# Patient Record
Sex: Female | Born: 1945
Health system: Southern US, Community
[De-identification: ages and names within clinical notes are randomized; demographics above are authoritative.]

## PROBLEM LIST (undated history)

## (undated) DIAGNOSIS — K589 Irritable bowel syndrome without diarrhea: Secondary | ICD-10-CM

## (undated) DIAGNOSIS — G20 Parkinson's disease: Secondary | ICD-10-CM

## (undated) DIAGNOSIS — R5383 Other fatigue: Secondary | ICD-10-CM

## (undated) DIAGNOSIS — G20A1 Parkinson's disease without dyskinesia, without mention of fluctuations: Secondary | ICD-10-CM

## (undated) DIAGNOSIS — R29818 Other symptoms and signs involving the nervous system: Secondary | ICD-10-CM

## (undated) DIAGNOSIS — E538 Deficiency of other specified B group vitamins: Secondary | ICD-10-CM

## (undated) DIAGNOSIS — L219 Seborrheic dermatitis, unspecified: Secondary | ICD-10-CM

## (undated) DIAGNOSIS — I509 Heart failure, unspecified: Secondary | ICD-10-CM

## (undated) DIAGNOSIS — N390 Urinary tract infection, site not specified: Secondary | ICD-10-CM

## (undated) DIAGNOSIS — H8113 Benign paroxysmal vertigo, bilateral: Secondary | ICD-10-CM

## (undated) DIAGNOSIS — K224 Dyskinesia of esophagus: Secondary | ICD-10-CM

## (undated) DIAGNOSIS — R002 Palpitations: Secondary | ICD-10-CM

## (undated) DIAGNOSIS — K219 Gastro-esophageal reflux disease without esophagitis: Secondary | ICD-10-CM

## (undated) DIAGNOSIS — M48061 Spinal stenosis, lumbar region without neurogenic claudication: Secondary | ICD-10-CM

## (undated) DIAGNOSIS — M48062 Spinal stenosis, lumbar region with neurogenic claudication: Secondary | ICD-10-CM

## (undated) DIAGNOSIS — I428 Other cardiomyopathies: Secondary | ICD-10-CM

## (undated) DIAGNOSIS — I493 Ventricular premature depolarization: Secondary | ICD-10-CM

## (undated) DIAGNOSIS — K635 Polyp of colon: Secondary | ICD-10-CM

## (undated) DIAGNOSIS — G9519 Other vascular myelopathies: Secondary | ICD-10-CM

## (undated) HISTORY — DX: Seborrheic dermatitis, unspecified: L21.9

## (undated) HISTORY — DX: Polyp of colon: K63.5

## (undated) HISTORY — DX: Other vascular myelopathies: G95.19

## (undated) HISTORY — DX: Parkinson's disease: G20

## (undated) HISTORY — PX: ENDOSCOPIC VEIN LASER TREATMENT: SHX1508

## (undated) HISTORY — DX: Heart failure, unspecified: I50.9

## (undated) HISTORY — DX: Other cardiomyopathies: I42.8

## (undated) HISTORY — PX: KNEE ARTHROSCOPY W/ MENISCECTOMY: SHX1879

## (undated) HISTORY — DX: Ventricular premature depolarization: I49.3

## (undated) HISTORY — DX: Irritable bowel syndrome, unspecified: K58.9

## (undated) HISTORY — DX: Dyskinesia of esophagus: K22.4

## (undated) HISTORY — DX: Spinal stenosis, lumbar region without neurogenic claudication: M48.061

## (undated) HISTORY — DX: Deficiency of other specified B group vitamins: E53.8

## (undated) HISTORY — DX: Other fatigue: R53.83

## (undated) HISTORY — DX: Gastro-esophageal reflux disease without esophagitis: K21.9

## (undated) HISTORY — DX: Parkinson's disease without dyskinesia, without mention of fluctuations: G20.A1

## (undated) HISTORY — DX: Other symptoms and signs involving the nervous system: R29.818

## (undated) HISTORY — DX: Palpitations: R00.2

## (undated) HISTORY — DX: Urinary tract infection, site not specified: N39.0

## (undated) HISTORY — PX: SPINE SURGERY: SHX786

---
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
## 1996-12-05 HISTORY — PX: COLECTOMY: SHX59

## 1998-11-25 ENCOUNTER — Other Ambulatory Visit: Admission: RE | Admit: 1998-11-25 | Discharge: 1998-11-25 | Payer: Self-pay | Admitting: Obstetrics & Gynecology

## 1999-11-04 ENCOUNTER — Encounter: Payer: Self-pay | Admitting: Family Medicine

## 1999-11-04 ENCOUNTER — Encounter: Admission: RE | Admit: 1999-11-04 | Discharge: 1999-11-04 | Payer: Self-pay | Admitting: Family Medicine

## 1999-11-08 ENCOUNTER — Encounter: Admission: RE | Admit: 1999-11-08 | Discharge: 1999-11-08 | Payer: Self-pay | Admitting: Family Medicine

## 1999-11-08 ENCOUNTER — Encounter: Payer: Self-pay | Admitting: Family Medicine

## 1999-12-06 HISTORY — PX: RECTOCELE REPAIR: SHX761

## 1999-12-29 ENCOUNTER — Other Ambulatory Visit: Admission: RE | Admit: 1999-12-29 | Discharge: 1999-12-29 | Payer: Self-pay | Admitting: Obstetrics & Gynecology

## 2000-06-14 ENCOUNTER — Ambulatory Visit (HOSPITAL_COMMUNITY): Admission: RE | Admit: 2000-06-14 | Discharge: 2000-06-14 | Payer: Self-pay | Admitting: Gastroenterology

## 2000-11-13 ENCOUNTER — Encounter: Payer: Self-pay | Admitting: Emergency Medicine

## 2000-11-13 ENCOUNTER — Emergency Department (HOSPITAL_COMMUNITY): Admission: EM | Admit: 2000-11-13 | Discharge: 2000-11-13 | Payer: Self-pay | Admitting: Emergency Medicine

## 2000-11-20 ENCOUNTER — Encounter: Payer: Self-pay | Admitting: Family Medicine

## 2000-11-20 ENCOUNTER — Encounter: Admission: RE | Admit: 2000-11-20 | Discharge: 2000-11-20 | Payer: Self-pay | Admitting: Family Medicine

## 2001-05-14 ENCOUNTER — Other Ambulatory Visit: Admission: RE | Admit: 2001-05-14 | Discharge: 2001-05-14 | Payer: Self-pay | Admitting: Obstetrics & Gynecology

## 2001-06-21 ENCOUNTER — Encounter: Admission: RE | Admit: 2001-06-21 | Discharge: 2001-06-21 | Payer: Self-pay | Admitting: Obstetrics & Gynecology

## 2001-06-21 ENCOUNTER — Encounter: Payer: Self-pay | Admitting: Obstetrics & Gynecology

## 2001-12-06 ENCOUNTER — Encounter: Payer: Self-pay | Admitting: Family Medicine

## 2001-12-06 ENCOUNTER — Encounter: Admission: RE | Admit: 2001-12-06 | Discharge: 2001-12-06 | Payer: Self-pay | Admitting: Family Medicine

## 2002-04-03 ENCOUNTER — Encounter: Payer: Self-pay | Admitting: Family Medicine

## 2002-04-03 ENCOUNTER — Encounter: Admission: RE | Admit: 2002-04-03 | Discharge: 2002-04-03 | Payer: Self-pay | Admitting: Family Medicine

## 2002-06-24 ENCOUNTER — Other Ambulatory Visit: Admission: RE | Admit: 2002-06-24 | Discharge: 2002-06-24 | Payer: Self-pay | Admitting: Obstetrics & Gynecology

## 2002-07-19 ENCOUNTER — Inpatient Hospital Stay (HOSPITAL_COMMUNITY): Admission: RE | Admit: 2002-07-19 | Discharge: 2002-07-20 | Payer: Self-pay | Admitting: Obstetrics & Gynecology

## 2002-07-21 ENCOUNTER — Inpatient Hospital Stay (HOSPITAL_COMMUNITY): Admission: AD | Admit: 2002-07-21 | Discharge: 2002-07-21 | Payer: Self-pay | Admitting: Obstetrics & Gynecology

## 2002-10-22 ENCOUNTER — Encounter: Payer: Self-pay | Admitting: Family Medicine

## 2002-10-22 ENCOUNTER — Encounter: Admission: RE | Admit: 2002-10-22 | Discharge: 2002-10-22 | Payer: Self-pay | Admitting: Family Medicine

## 2003-01-23 ENCOUNTER — Encounter: Admission: RE | Admit: 2003-01-23 | Discharge: 2003-01-23 | Payer: Self-pay | Admitting: Obstetrics & Gynecology

## 2003-01-23 ENCOUNTER — Encounter: Payer: Self-pay | Admitting: Obstetrics & Gynecology

## 2003-01-28 ENCOUNTER — Encounter: Payer: Self-pay | Admitting: Obstetrics & Gynecology

## 2003-01-28 ENCOUNTER — Encounter: Admission: RE | Admit: 2003-01-28 | Discharge: 2003-01-28 | Payer: Self-pay | Admitting: Obstetrics & Gynecology

## 2003-07-14 ENCOUNTER — Encounter: Admission: RE | Admit: 2003-07-14 | Discharge: 2003-07-14 | Payer: Self-pay | Admitting: Obstetrics & Gynecology

## 2003-07-14 ENCOUNTER — Encounter: Payer: Self-pay | Admitting: Obstetrics & Gynecology

## 2003-10-27 ENCOUNTER — Other Ambulatory Visit: Admission: RE | Admit: 2003-10-27 | Discharge: 2003-10-27 | Payer: Self-pay | Admitting: Obstetrics & Gynecology

## 2004-02-16 ENCOUNTER — Encounter: Admission: RE | Admit: 2004-02-16 | Discharge: 2004-02-16 | Payer: Self-pay | Admitting: Obstetrics & Gynecology

## 2004-04-05 ENCOUNTER — Encounter: Admission: RE | Admit: 2004-04-05 | Discharge: 2004-04-05 | Payer: Self-pay | Admitting: Family Medicine

## 2004-08-01 ENCOUNTER — Encounter: Admission: RE | Admit: 2004-08-01 | Discharge: 2004-08-01 | Payer: Self-pay | Admitting: Family Medicine

## 2004-12-27 ENCOUNTER — Other Ambulatory Visit: Admission: RE | Admit: 2004-12-27 | Discharge: 2004-12-27 | Payer: Self-pay | Admitting: Obstetrics & Gynecology

## 2005-03-14 ENCOUNTER — Encounter: Admission: RE | Admit: 2005-03-14 | Discharge: 2005-03-14 | Payer: Self-pay | Admitting: Obstetrics & Gynecology

## 2006-06-12 ENCOUNTER — Encounter: Admission: RE | Admit: 2006-06-12 | Discharge: 2006-06-12 | Payer: Self-pay | Admitting: Obstetrics & Gynecology

## 2007-07-09 ENCOUNTER — Encounter: Admission: RE | Admit: 2007-07-09 | Discharge: 2007-07-09 | Payer: Self-pay | Admitting: Obstetrics & Gynecology

## 2008-09-15 ENCOUNTER — Encounter: Admission: RE | Admit: 2008-09-15 | Discharge: 2008-09-15 | Payer: Self-pay | Admitting: Obstetrics & Gynecology

## 2009-09-21 ENCOUNTER — Encounter: Admission: RE | Admit: 2009-09-21 | Discharge: 2009-09-21 | Payer: Self-pay | Admitting: Obstetrics & Gynecology

## 2009-10-22 ENCOUNTER — Encounter: Admission: RE | Admit: 2009-10-22 | Discharge: 2009-10-22 | Payer: Self-pay | Admitting: Family Medicine

## 2010-02-27 ENCOUNTER — Emergency Department (HOSPITAL_COMMUNITY): Admission: EM | Admit: 2010-02-27 | Discharge: 2010-02-27 | Payer: Self-pay | Admitting: Family Medicine

## 2010-03-02 ENCOUNTER — Encounter: Admission: RE | Admit: 2010-03-02 | Discharge: 2010-03-02 | Payer: Self-pay | Admitting: Family Medicine

## 2010-03-05 ENCOUNTER — Encounter: Admission: RE | Admit: 2010-03-05 | Discharge: 2010-03-05 | Payer: Self-pay | Admitting: Family Medicine

## 2010-04-08 ENCOUNTER — Encounter: Admission: RE | Admit: 2010-04-08 | Discharge: 2010-04-08 | Payer: Self-pay | Admitting: Family Medicine

## 2010-08-19 ENCOUNTER — Encounter: Admission: RE | Admit: 2010-08-19 | Discharge: 2010-08-19 | Payer: Self-pay | Admitting: Family Medicine

## 2010-09-23 ENCOUNTER — Encounter: Admission: RE | Admit: 2010-09-23 | Discharge: 2010-09-23 | Payer: Self-pay | Admitting: Family Medicine

## 2010-11-10 ENCOUNTER — Encounter
Admission: RE | Admit: 2010-11-10 | Discharge: 2010-11-10 | Payer: Self-pay | Source: Home / Self Care | Admitting: Family Medicine

## 2010-12-01 ENCOUNTER — Inpatient Hospital Stay (HOSPITAL_COMMUNITY)
Admission: RE | Admit: 2010-12-01 | Discharge: 2010-12-02 | Payer: Self-pay | Source: Home / Self Care | Attending: Neurosurgery | Admitting: Neurosurgery

## 2010-12-22 ENCOUNTER — Encounter
Admission: RE | Admit: 2010-12-22 | Discharge: 2010-12-22 | Payer: Self-pay | Source: Home / Self Care | Attending: Neurosurgery | Admitting: Neurosurgery

## 2011-01-14 ENCOUNTER — Other Ambulatory Visit (HOSPITAL_COMMUNITY): Payer: Self-pay | Admitting: Neurosurgery

## 2011-01-14 DIAGNOSIS — M5126 Other intervertebral disc displacement, lumbar region: Secondary | ICD-10-CM

## 2011-01-17 ENCOUNTER — Ambulatory Visit (HOSPITAL_COMMUNITY)
Admission: RE | Admit: 2011-01-17 | Discharge: 2011-01-17 | Disposition: A | Discharge status: Home / Self Care | Payer: Medicare Other | Source: Ambulatory Visit | Attending: Neurosurgery | Admitting: Neurosurgery

## 2011-01-17 DIAGNOSIS — M5126 Other intervertebral disc displacement, lumbar region: Secondary | ICD-10-CM

## 2011-01-17 DIAGNOSIS — M79609 Pain in unspecified limb: Secondary | ICD-10-CM | POA: Insufficient documentation

## 2011-01-17 DIAGNOSIS — M545 Low back pain, unspecified: Secondary | ICD-10-CM | POA: Insufficient documentation

## 2011-01-17 DIAGNOSIS — M519 Unspecified thoracic, thoracolumbar and lumbosacral intervertebral disc disorder: Secondary | ICD-10-CM | POA: Insufficient documentation

## 2011-01-17 MED ORDER — IOHEXOL 180 MG/ML  SOLN: 20.0000 mL | Freq: Once | INTRAMUSCULAR | Status: DC | PRN

## 2011-01-20 ENCOUNTER — Encounter (HOSPITAL_COMMUNITY)
Admission: RE | Admit: 2011-01-20 | Discharge: 2011-01-20 | Disposition: A | Discharge status: Home / Self Care | Payer: Medicare Other | Source: Ambulatory Visit | Attending: Neurosurgery | Admitting: Neurosurgery

## 2011-01-20 DIAGNOSIS — Z01812 Encounter for preprocedural laboratory examination: Secondary | ICD-10-CM | POA: Insufficient documentation

## 2011-01-20 LAB — BASIC METABOLIC PANEL
BUN: 24 mg/dL — ABNORMAL HIGH (ref 6–23)
CO2: 30 mEq/L (ref 19–32)
Calcium: 9.6 mg/dL (ref 8.4–10.5)
Chloride: 105 mEq/L (ref 96–112)
Creatinine, Ser: 0.96 mg/dL (ref 0.4–1.2)
GFR calc Af Amer: >60 mL/min (ref >60)
GFR calc non Af Amer: 58 mL/min — ABNORMAL LOW (ref >60)
Glucose, Bld: 100 mg/dL — ABNORMAL HIGH (ref 70–99)
Potassium: 5.3 mEq/L — ABNORMAL HIGH (ref 3.5–5.1)
Sodium: 143 mEq/L (ref 135–145)

## 2011-01-20 LAB — CBC
HCT: 37.7 % (ref 36.0–46.0)
Hemoglobin: 12.1 g/dL (ref 12.0–15.0)
MCH: 28.8 pg (ref 26.0–34.0)
MCHC: 32.1 g/dL (ref 30.0–36.0)
MCV: 89.8 fL (ref 78.0–100.0)
Platelets: 297 10*3/uL (ref 150–400)
RBC: 4.2 MIL/uL (ref 3.87–5.11)
RDW: 12.6 % (ref 11.5–15.5)
WBC: 6.6 10*3/uL (ref 4.0–10.5)

## 2011-01-20 LAB — SURGICAL PCR SCREEN
MRSA, PCR: NEGATIVE
Staphylococcus aureus: NEGATIVE

## 2011-01-24 ENCOUNTER — Inpatient Hospital Stay (HOSPITAL_COMMUNITY)
Admission: RE | Admit: 2011-01-24 | Discharge: 2011-01-25 | DRG: 491 | Disposition: A | Discharge status: Home / Self Care | Payer: Medicare Other | Source: Ambulatory Visit | Attending: Neurosurgery | Admitting: Neurosurgery

## 2011-01-24 ENCOUNTER — Inpatient Hospital Stay (HOSPITAL_COMMUNITY): Payer: Medicare Other

## 2011-01-24 DIAGNOSIS — M5126 Other intervertebral disc displacement, lumbar region: Principal | ICD-10-CM | POA: Diagnosis present

## 2011-01-24 DIAGNOSIS — Z8614 Personal history of Methicillin resistant Staphylococcus aureus infection: Secondary | ICD-10-CM

## 2011-01-24 DIAGNOSIS — J449 Chronic obstructive pulmonary disease, unspecified: Secondary | ICD-10-CM | POA: Diagnosis present

## 2011-01-24 DIAGNOSIS — K219 Gastro-esophageal reflux disease without esophagitis: Secondary | ICD-10-CM | POA: Diagnosis present

## 2011-01-24 DIAGNOSIS — J4489 Other specified chronic obstructive pulmonary disease: Secondary | ICD-10-CM | POA: Diagnosis present

## 2011-01-26 NOTE — Op Note (Signed)
Carrie [name], Carrie [name]                ACCOUNT NO.:  [id number]  MEDICAL RECORD NO.:  [id number]           PATIENT TYPE:  I  LOCATION:  3526                         FACILITY:  [facility]  PHYSICIAN:  Hewitt Shorts, M.D.DATE OF BIRTH:  03/19/1946  DATE OF PROCEDURE:  01/24/2011 DATE OF DISCHARGE:  01/20/2011                              OPERATIVE REPORT   PREOPERATIVE DIAGNOSES:  Recurrent left L4-L5 lumbar disk herniation, lumbar degenerative disk disease, lumbar spondylosis, lumbar radiculopathy.  POSTOPERATIVE DIAGNOSES:  Recurrent left L4-L5 lumbar disk herniation, lumbar degenerative disk disease, lumbar spondylosis, lumbar radiculopathy.  PROCEDURE:  Left L4-L5 lumbar laminotomy and microdiskectomy.  SURGEON:  Hewitt Shorts, MD  ASSISTANT:  Hilda Lias, MD  ANESTHESIA:  General endotracheal.  INDICATIONS:  The patient is a 65 year old woman who is about 2 months status post a left L4-L5 lumbar laminotomy and microdiskectomy.  She had excellent relief for radicular pain for about 10 days and then the pain recurred and did not respond to any treatment measures.  She was evaluated with MRI scan and subsequently with possibly myelogram CT scan which revealed recurrent disk herniation and decision was made to proceed with elective laminotomy and microdiskectomy.  DESCRIPTION OF PROCEDURE:  The patient was brought to the operating room, placed under general endotracheal anesthesia.  The patient was turned to prone position.  Lumbar region was prepped with Betadine soap solution and draped in the sterile fashion.  The midline incision was infiltrated with local anesthetic with epinephrine.  A midline incision was made, carried down through the subcutaneous tissue.  Bipolar electrocautery was used to maintain hemostasis.  Dissection was carried out through the lumbar fascia which was incised on the left side of the midline.  The paraspinal muscles were dissected from the  spinous process and lamina in a subperiosteal fashion.  Self-retaining retractor was placed and x-ray was taken and the L4-L5 interlaminar space was identified.  The microscope was draped and brought to the field to provide additional navigation, illumination, and visualization. Remainder of the decompression was performed using microdissection and microsurgical technique.  Her previous laminotomy was defined using a microcurette to remove the scar tissue.  We were able to define the borders of the laminotomy and encountered what appeared to be a recurrent disk herniation.  There was also possibly some synovial cyst emanating from the facet joint.  This was removed as well.  We identified the thecal sac and exiting left L5 nerve root.  There was some disk material that extruded rostral to the disk space, this was also removed and we entered into the disk space and continued with a thorough diskectomy using a variety of microcurette and pituitary rongeurs.  In the end, all loose fragments of disk material were removed from the epidural space and disk space and good decompression of the thecal sac and nerve root was achieved.  Once decompression was completed, hemostasis was established with the use of bipolar electrocautery and then we instilled 2 mL of fentanyl, 80 mg of Depo- Medrol into the epidural space to proceed with closure.  Deep fascia closed with interrupted undyed 1 Vicryl  sutures, subcutaneous and subcuticular were closed with interrupted inverted 2-0 undyed Vicryl sutures.  Skin was approximated with Dermabond.  Procedure was tolerated well.  Estimated blood loss was less than 25 mL.  Sponge count correct.  Following surgery, the patient was turned back to supine position to be reversed from the anesthetic, extubated, and transferred to the recovery room for further care.     Hewitt Shorts, M.D.     RWN/MEDQ  D:  01/24/2011  T:  01/25/2011  Job:   [id number]  Electronically Signed by Shirlean Kelly M.D. on 01/26/2011 11:36:14 AM

## 2011-02-14 LAB — BASIC METABOLIC PANEL
BUN: 17 mg/dL (ref 6–23)
CO2: 31 mEq/L (ref 19–32)
Calcium: 9.5 mg/dL (ref 8.4–10.5)
Chloride: 108 mEq/L (ref 96–112)
Creatinine, Ser: 0.84 mg/dL (ref 0.4–1.2)
GFR calc Af Amer: >60 mL/min (ref >60)
GFR calc non Af Amer: >60 mL/min (ref >60)
Glucose, Bld: 101 mg/dL — ABNORMAL HIGH (ref 70–99)
Potassium: 5 mEq/L (ref 3.5–5.1)
Sodium: 143 mEq/L (ref 135–145)

## 2011-02-14 LAB — CBC
HCT: 39.2 % (ref 36.0–46.0)
Hemoglobin: 12.3 g/dL (ref 12.0–15.0)
MCH: 28.7 pg (ref 26.0–34.0)
MCHC: 31.4 g/dL (ref 30.0–36.0)
MCV: 91.4 fL (ref 78.0–100.0)
Platelets: 238 10*3/uL (ref 150–400)
RBC: 4.29 MIL/uL (ref 3.87–5.11)
RDW: 13.2 % (ref 11.5–15.5)
WBC: 7.1 10*3/uL (ref 4.0–10.5)

## 2011-02-14 LAB — SURGICAL PCR SCREEN
MRSA, PCR: NEGATIVE
Staphylococcus aureus: POSITIVE — AB

## 2011-04-05 ENCOUNTER — Other Ambulatory Visit: Payer: Self-pay | Admitting: Neurosurgery

## 2011-04-05 DIAGNOSIS — M545 Low back pain, unspecified: Secondary | ICD-10-CM

## 2011-04-06 ENCOUNTER — Ambulatory Visit
Admission: RE | Admit: 2011-04-06 | Discharge: 2011-04-06 | Disposition: A | Discharge status: Home / Self Care | Payer: BLUE CROSS/BLUE SHIELD | Source: Ambulatory Visit | Attending: Neurosurgery | Admitting: Neurosurgery

## 2011-04-06 DIAGNOSIS — M545 Low back pain, unspecified: Secondary | ICD-10-CM

## 2011-04-06 MED ORDER — GADOBENATE DIMEGLUMINE 529 MG/ML IV SOLN
15.0000 mL | Freq: Once | INTRAVENOUS | Status: AC | PRN
  Administered 2011-04-06: 15 mL via INTRAVENOUS

## 2011-04-19 ENCOUNTER — Encounter (HOSPITAL_COMMUNITY)
Admission: RE | Admit: 2011-04-19 | Discharge: 2011-04-19 | Disposition: A | Discharge status: Home / Self Care | Payer: Medicare Other | Source: Ambulatory Visit | Attending: Neurosurgery | Admitting: Neurosurgery

## 2011-04-19 LAB — SURGICAL PCR SCREEN
MRSA, PCR: NEGATIVE
Staphylococcus aureus: NEGATIVE

## 2011-04-19 LAB — ABO/RH: ABO/RH(D): A POS

## 2011-04-19 LAB — CBC
HCT: 38.5 % (ref 36.0–46.0)
Hemoglobin: 12.7 g/dL (ref 12.0–15.0)
MCH: 29.7 pg (ref 26.0–34.0)
MCHC: 33 g/dL (ref 30.0–36.0)
MCV: 90.2 fL (ref 78.0–100.0)
Platelets: 255 10*3/uL (ref 150–400)
RBC: 4.27 MIL/uL (ref 3.87–5.11)
RDW: 12.6 % (ref 11.5–15.5)
WBC: 7.1 10*3/uL (ref 4.0–10.5)

## 2011-04-19 LAB — BASIC METABOLIC PANEL
BUN: 29 mg/dL — ABNORMAL HIGH (ref 6–23)
CO2: 30 mEq/L (ref 19–32)
Calcium: 9.8 mg/dL (ref 8.4–10.5)
Chloride: 103 mEq/L (ref 96–112)
Creatinine, Ser: 1.16 mg/dL (ref 0.4–1.2)
GFR calc Af Amer: 57 mL/min — ABNORMAL LOW (ref >60)
GFR calc non Af Amer: 47 mL/min — ABNORMAL LOW (ref >60)
Glucose, Bld: 100 mg/dL — ABNORMAL HIGH (ref 70–99)
Potassium: 5.2 mEq/L — ABNORMAL HIGH (ref 3.5–5.1)
Sodium: 140 mEq/L (ref 135–145)

## 2011-04-19 LAB — TYPE AND SCREEN
ABO/RH(D): A POS
Antibody Screen: NEGATIVE

## 2011-04-21 ENCOUNTER — Inpatient Hospital Stay (HOSPITAL_COMMUNITY)
Admission: RE | Admit: 2011-04-21 | Discharge: 2011-04-27 | DRG: 460 | Disposition: A | Discharge status: Home Health | Payer: Medicare Other | Source: Ambulatory Visit | Attending: Neurosurgery | Admitting: Neurosurgery

## 2011-04-21 ENCOUNTER — Inpatient Hospital Stay (HOSPITAL_COMMUNITY): Payer: Medicare Other

## 2011-04-21 DIAGNOSIS — M5126 Other intervertebral disc displacement, lumbar region: Secondary | ICD-10-CM | POA: Diagnosis present

## 2011-04-21 DIAGNOSIS — K219 Gastro-esophageal reflux disease without esophagitis: Secondary | ICD-10-CM | POA: Diagnosis present

## 2011-04-21 DIAGNOSIS — M51379 Other intervertebral disc degeneration, lumbosacral region without mention of lumbar back pain or lower extremity pain: Principal | ICD-10-CM | POA: Diagnosis present

## 2011-04-21 DIAGNOSIS — J449 Chronic obstructive pulmonary disease, unspecified: Secondary | ICD-10-CM | POA: Diagnosis present

## 2011-04-21 DIAGNOSIS — M5137 Other intervertebral disc degeneration, lumbosacral region: Principal | ICD-10-CM | POA: Diagnosis present

## 2011-04-21 DIAGNOSIS — J4489 Other specified chronic obstructive pulmonary disease: Secondary | ICD-10-CM | POA: Diagnosis present

## 2011-04-22 NOTE — Op Note (Signed)
Carrie [name], Carrie [name]                ACCOUNT NO.:  [id number]  MEDICAL RECORD NO.:  [id number]           PATIENT TYPE:  I  LOCATION:  3534                         FACILITY:  [facility]  PHYSICIAN:  Hewitt Shorts, M.D.DATE OF BIRTH:  26-Dec-1945  DATE OF PROCEDURE:  04/21/2011 DATE OF DISCHARGE:                              OPERATIVE REPORT   PREOPERATIVE DIAGNOSES:  Recurrent left L4-5 lumbar disk herniation, lumbar degenerative disk disease, lumbar spondylosis, and lumbar radiculopathy.  POSTOPERATIVE DIAGNOSES:  Recurrent left L4-5 lumbar disk herniation, lumbar degenerative disk disease, lumbar spondylosis, and lumbar radiculopathy.  PROCEDURES:  Bilateral L4-5 lumbar laminotomy, facetectomy, foraminotomy, and microdiskectomy with microdissection and microsurgical technique.  Bilateral L4-5 posterior lumbar interbody arthrodesis with AVS PEEK interbody implants, Vitoss bone marrow aspirate, infuse bilateral L4-5 posterior arthrodesis with radius posterior instrumentation Vitoss bone marrow aspirate and infuse  SURGEON:  Hewitt Shorts, MD  ASSISTANT:  Danae Orleans. Venetia Maxon, MD  ANESTHESIA:  General endotracheal.  INDICATIONS:  The patient is a 65 year old woman who suffered a second recurrent disk herniation on the left side at L4-5 with recurrent lumbar radiculopathy.  She had undergone surgery at the end of December and again in February, but unfortunately developed recurrent radiculopathy. MRI scan revealed large recurrent disk herniation.  Decision was made to proceed with decompression and stabilization.  PROCEDURE:  The patient was brought to the operating room and placed under general endotracheal anesthesia.  Lumbar region was prepped with Betadine soap and solution, and draped in a sterile fashion.  The midline was infiltrated with local anesthetic with epinephrine and the previous midline incision was reopened and extended rostrally and caudally.  Dissection was  carried down through the subcutaneous tissue. Bipolar cautery and electrocautery were used to maintain hemostasis. Dissection was carried down to the lumbar fascia, which was incised bilaterally and the paraspinal muscles were dissected from the spinous process and lamina in a subperiosteal fashion.  The previous left L4-5 laminotomy was noted and we were able to dissect around the margins of that laminotomy.  An x-ray was taken to confirm our localization and then we dissected laterally over the mildly hypertrophic facet complex at L4-5 exposing the transverse process at L4 and L5 bilaterally.  Then with magnification and microdissection and microsurgical technique, we proceeded with decompression, bilateral laminotomy was performed. Medial facetectomy and foraminotomy we removed the ligamentum flavum to expose the thecal sac and nerve roots.  Left-sided diskectomy was begun on the right side.  The annulus was exposed.  The overlying epidural veins coagulated and divided and then we incised the annulus and entered into the disk space, proceeded with thorough diskectomy using a variety of pituitary rongeurs and micro curettes.  Spinal overgrowth in the posterior aspect of the vertebral body was removed and then engaged within the left side.  On entry into the disk space, we proceeded with diskectomy from the left side again using micro curettes and pituitary rongeurs; however, there was plenty of fragmented disk embedded within the ventral epidural tissues.  These were gently mobilized looking for a hook.  We removed in a piecemeal fashion with decompression of  thecal sac and nerve roots.  Once the diskectomy was completed, we prepared the interbody space for interbody arthrodesis using paddle curettes.  The cartilaginous endplate was removed and then we measured the height of the intervertebral disk space and selected two 8-mm in height PEEK implants.  We then draped the C-arm  fluoroscope, was brought into the field and localized the pedicle entry sites bilaterally at L4 and 5.  The pedicles was probed with bone marrow aspirate from the vertebral body and was injected over 10-mL strip of Vitoss.  We then examined each of the pedicles with a ball probe, good bony surface were found.  Each was tapped with 5.25 mm tap.  Again good threading was found.  On insertion we found no cutouts and then we placed 5.75 x 44 mm screws at L4, 5.75 x 37 mm screws at L5.  We then packed two 8-mm height implants with combination of Vitoss with bone marrow aspirate with infuse foam was placed in the right side medially to identify the seventh, tenth  necrotic space.  The left side was packed with additional infuse and Vitoss bone marrow aspirate.  Then retracted the thecal sac and nerve root medially and gently tamped the left-sided implant in position.  Additional Vitoss with bone marrow aspirate lateral to each vertebral body.  We then exposed the lateral gutter transverse process L4 and L5 disk space infused with Vitoss bone marrow aspirate.  We then selected two 35- mm rods were placed in the screw head and locking caps were placed andthen final tightening was done.  The wound was irrigated numerous times throughout the procedure with sterile solution, subsequently with bacitracin solution.  Good hemostasis was established and confirmed. Decompression was completed.  Hemostasis established.  We proceed with closure.  Deep fascia closed with interrupted undyed #1 Vicryl suture. Subcutaneous and subcuticular closed with interrupted inverted 2-0 undyed Vicryl sutures.  Skin was approximated with Dermabond.  The procedure was tolerated well.  The estimated blood loss 150 mL.  Sponge and needle count correct.  We did use a Cell Saver during the procedure, but the Cell Saver technician felt that there was insufficient blood loss to process the collected specimen.  The wound was  dressed with sterile gauze and 4-inch Hypafix.  Following surgery, the patient returned back to a supine position, reversed from the anesthetic, extubated and transferred to the recovery room for further care.     Hewitt Shorts, M.D.     RWN/MEDQ  D:  04/21/2011  T:  04/22/2011  Job:  [id number]  Electronically Signed by Shirlean Kelly M.D. on 04/22/2011 11:09:43 AM

## 2011-04-22 NOTE — Procedures (Signed)
[facility]  Patient:    Carrie [name], Carrie [name]                       MRN: [id number] Proc. Date: 06/14/00 Adm. Date:  [id number] Disc. Date: [id number] Attending:  Deneen Harts CC:         Quita Skye. Artis Flock, M.D.                           Procedure Report  PROCEDURE:  Colonoscopy.  ENDOSCOPIST:  Griffith Citron, M.D. [facility]  INDICATIONS:  A 65 year old white female undergoing colonoscopy for neoplasia surveillance.  Significant hepatic flexure adenoma resected surgically with right hemicolectomy and ileocolostomy performed three years ago by Dr. Cyndia Bent.  A large adenoma was found at colonoscopy and was felt to represent an early malignancy.  Pathology revealed benign adenoma.  The patient in the interim with no difficulty.  Undergoing repeat colonoscopy for neoplasia surveillance.  DESCRIPTION OF PROCEDURE:  After reviewing the nature of the procedure with the patient including potential risks and complications, and after discussing alternative methods of diagnosis including flexible sigmoidoscopy/barium enema, informed consent was signed.  The patient was premedicated receiving IV sedation administered in divided doses totaling Versed 10 mg, fentanyl 100 mcg.  Using an Olympus pediatric PCF-140L video colonoscope, the rectum was intubated after normal digital examination.  The scope was inserted in the rectum and advanced under direct vision around the entire length of the colon to the ileocolostomy which was apparent in a side-by-side manner in the region of the hepatic flexure.  Preparation was excellent throughout.  The scope was slowly withdrawn with careful inspection of the entire colon in a retrograde manner including retroflex view in the rectal vault.  No abnormality was noted.  Specifically there was no evidence of colorectal neoplasia, diverticular disease, mucosal inflammation, or vascular lesion. Retroflex view did reveal  internal hemorrhoids which were not inflamed.  The colon was decompressed and the scope withdrawn.  The patient tolerated the procedure without difficulty being maintained on Datascope monitor and low-flow oxygen throughout.  Returned to recovery in stable condition.  TIME: 1, TECHNICAL: 2, PREPARATION: 1,  TOTALS = 4.  ASSESSMENT: 1. Status post right hemicolectomy with a side-by-side ileocolostomy at the    hepatic flexure. 2. No recurrent colorectal neoplasia. 3. Internal hemorrhoids - noninflamed.  RECOMMENDATIONS: 1. Rectal care p.r.n. 2. Repeat colonoscopy in five years time. DD:  06/14/00 TD:  06/14/00 Job: [id number]

## 2011-04-22 NOTE — Discharge Summary (Signed)
   NAME:  Carrie [name], Carrie [name]                          ACCOUNT NO.:  [id number]   MEDICAL RECORD NO.:  [id number]                   PATIENT TYPE:  MAT   LOCATION:  MATC                                 FACILITY:  [facility]   PHYSICIAN:  Ilda Mori, M.D.                DATE OF BIRTH:  July 21, 1946   DATE OF ADMISSION:  07/19/2002  DATE OF DISCHARGE:  07/20/2002                                 DISCHARGE SUMMARY   FINAL DIAGNOSIS:  Symptomatic rectocele.   SECONDARY DIAGNOSES:  None.   PROCEDURE:  Posterior repair.   CONDITION ON DISCHARGE:  Improved.   HOSPITAL COURSE:  This is a 65 year old gravida 2 para 2 who has  approximately a one-year history of difficulty passing stools.  The patient  found that she had to press on the posterior vaginal wall to direct the  stool through the sphincter.  This problem has been increasing throughout  the year and the patient requested surgical correction.  The patient was  brought to the operating room on the day of admission where a posterior  repair was performed without complication.  The patient was observed 24  hours postoperatively at which time she was found to be voiding normally and  her pain was controlled with oral analgesia.  She was therefore discharged  on postoperative day #1 and told to eat a regular diet, to maintain  decreased activity and complete pelvic rest.  She was given stool softeners  to take on a daily basis and Tylox #20 tablets to take one to two q.4h. for  pain.  She was asked to call the office and make a follow-up visit in one  week.   LABORATORY DATA:  Preoperative hemoglobin 13.3, white count 7600.  Routine  chemistries were all within normal limits except for a slightly elevated  sodium of 150.  A urinalysis was benign except for moderate leukocyte  esterase.   An EKG showed some mild abnormalities.                                               Ilda Mori, M.D.    RK/MEDQ  D:  08/28/2002  T:  08/28/2002   Job:  [id number]

## 2011-04-22 NOTE — H&P (Signed)
   NAME:  Carrie [name], Carrie [name]                          ACCOUNT NO.:  [id number]   MEDICAL RECORD NO.:  [id number]                   PATIENT TYPE:  INP   LOCATION:  NA                                   [facility]   PHYSICIAN:  Ilda Mori, MD                  DATE OF BIRTH:  1946/01/22   DATE OF ADMISSION:  07/19/2002  DATE OF DISCHARGE:                                HISTORY & PHYSICAL   CHIEF COMPLAINT:  Rectocele.   HISTORY OF PRESENT ILLNESS:  This is a 65 year old, gravida 2, para 2, who  was seen at her last annual physical exam in July, complaining of difficulty  passing stool.  The patient has had a known rectocele for several years  which had not been symptomatic.  However, at this time, the patient says  that when straining at stool, in order to achieve evacuation, she has to put  pressure on the posterior aspect of the vagina.   PAST MEDICAL HISTORY:  Her previous surgeries include a right colectomy for  a benign polyp.  Medical problems are that she was diagnosed with a  tachyarrhythmia which is well-controlled and has had TMJ syndrome.   MEDICATIONS:  Norpace, Nexium, and Zantac.   ALLERGIES:  ERYTHROMYCIN, CODEINE.   FAMILY HISTORY:  She had a maternal uncle with colon cancer, maternal aunt  with breast and colon cancer.  No history of heart disease in the family.   SOCIAL HISTORY:  She is a nonsmoker.   REVIEW OF SYSTEMS:  Negative in detail.  Of note, is that she has no  symptoms of urinary stress incontinence.   PHYSICAL EXAMINATION:  EARS/NOSE/THROAT:  Normal.  BREASTS:  Without masses.  HEART/LUNGS:  Heart without murmur or gallop, and her lungs are clear.  ABDOMEN:  Soft without hepatosplenomegaly.  She has no lymphadenopathy.  EXTERNAL GENITALIA:  Normal.  VAGINA:  3+ rectocele and a 1-2+ cystocele.  UTERUS:  Small with no cervicouterine prolapse noted.   ADMITTING DIAGNOSIS:  Symptomatic rectocele.   HOSPITAL PLAN:  Posterior repair.                                       Ilda Mori, MD    RK/MEDQ  D:  07/17/2002  T:  07/17/2002  Job:  [id number]

## 2011-04-22 NOTE — Op Note (Signed)
   NAME:  Carrie [name], Carrie [name]                          ACCOUNT NO.:  [id number]   MEDICAL RECORD NO.:  [id number]                   PATIENT TYPE:  INP   LOCATION:  9309                                 [facility]ACILITY[facility]  [facility]   PHYSICIAN:  Ilda Mori, MD                  DATE OF BIRTH:  11/25/1946   DATE OF PROCEDURE:  DATE OF DISCHARGE:                                 OPERATIVE REPORT   PREOPERATIVE DIAGNOSIS:  Symptomatic rectocele.   POSTOPERATIVE DIAGNOSIS:  Symptomatic rectocele.   PROCEDURE:  Posterior repair.   SURGEON:  Ilda Mori, MD   ASSISTANT:  Randye Lobo, M.D.   ANESTHESIA:  General endotracheal.   ESTIMATED BLOOD LOSS:  100 cc.   FINDINGS:  3+ rectocele.   INDICATIONS FOR PROCEDURE:  This is a 65 year old  gravida 2, para 2 who  noted approximately one year prior to admission difficulty passing her  stools. She found that she had to press on the posterior vaginal wall to  direct the stool through the sphincter. This problem increased throughout  the year and she requested surgical correction.   PROCEDURE:  The patient  was taken to the operating room and general  endotracheal anesthesia was induced. She was then placed in the dorsal  lithotomy position. The perineum and vagina were prepped and draped in a  sterile fashion. The perineum was incised sharply in a V-shape and the  vaginal mucosa was dissected from the rectum. The surgeon then placed his  left hand in the rectum and the areas of the rectocele were delineated and  closed with bring the levator muscles across the midline with interrupted 0  Vicryl suture. After the rectocele had been reduced and repaired the  excessive vaginal mucosa was excised and the vaginal tissue was closed with  a running 0 Vicryl suture with the sutures being placed close together to  take care not to shorten the vaginal vault. The perineum was then also  reinforced by bring the bulbocavernosus vessels across the midline for  extra  support and then the vulvar skin was closed with a subcuticular suture. The  vagina was then packed with 1-inch gauze in estrogen cream. The procedure  was then terminated. The bladder was catheterized prior to leaving the  operating room and clear urine was found.                                               Ilda Mori, MD    RK/MEDQ  D:  07/19/2002  T:  07/19/2002  Job:  [id number]

## 2011-04-25 ENCOUNTER — Inpatient Hospital Stay (HOSPITAL_COMMUNITY): Payer: Medicare Other

## 2011-04-27 NOTE — Discharge Summary (Signed)
  Carrie [name], [name]                ACCOUNT NO.:  [id number]  MEDICAL RECORD NO.:  [id number]           PATIENT TYPE:  I  LOCATION:  3034                         FACILITY:  [facility]  PHYSICIAN:  Hewitt Shorts, M.D.DATE OF BIRTH:  1946-10-17  DATE OF ADMISSION:  04/21/2011 DATE OF DISCHARGE:  04/27/2011                              DISCHARGE SUMMARY   ADMISSION HISTORY AND PHYSICAL EXAMINATION:  The patient is a 65 year old woman who suffered a left L4-5 lumbar disk herniation last year. She underwent a diskectomy in late December 2011.  She did well initially but developed recurrent pain and was found to recurrent disk herniation, underwent surgery for recurrent disk herniation for diskectomy in February 2012.  Again she did well following that surgery but again she developed recurrent pain and was found to have another recurrent disk herniation and decision was made to proceed with decompression and stabilization.  The patient was admitted for such. General examination was unremarkable.  Neurologic examination showed intact strength but some decreased sensation in the left foot.  Further details of her admission history and physical examination included in her admission note.  HOSPITAL COURSE:  The patient underwent a bilateral L4-5 lumbar decompression, post lumbar interbody arthrodesis and posterolateral arthrodesis.  Following surgery, she was able to make gradual progress in terms of mobility.  Physical therapy and occupational therapy were consulted and worked with the patient regarding transfers, ambulation and ADLs.  They did recommend home health PT and that was ordered.  The patient has a rolling walker with 5-inch wheels at home.  We did order a 3 in 1 for the patient.  She had good relief with the left lumbar radicular pain but she had some pain into the right buttock and hip.  We obtained x-rays of her lumbar fusion which showed instrumentation and interbody  implants in good position.  The patient was started on naproxen which she had been on prior to hospitalization and with that the discomfort has diminished, although not fully resolved, but her wound is healing well.  There is mild bruising around it but the actual incision is healing nicely.  She is afebrile.  She is able to mobilize and transfer on her own and ambulate.  She has been instructed in the use of her lumbar brace and is able to don and doff on her own.  She is being given prescription for Vicodin 1-2 tablets q.4-6 h. p.r.n. pain 60 tablets no refills and she asked for antiemetic and was prescribed Vistaril 50 mg p.o. q.4 h. p.r.n. nausea, vomiting, 30 tablets with no refills.  She is going to be using Aleve 2 tablets b.i.d. as well as all her other usual home medications.  DISCHARGE DIAGNOSIS:  Recurrent lumbar disk herniation.     Hewitt Shorts, M.D.     RWN/MEDQ  D:  04/27/2011  T:  04/27/2011  Job:  [id number]  Electronically Signed by Shirlean Kelly M.D. on 04/27/2011 05:30:50 PM

## 2011-04-27 NOTE — H&P (Signed)
Carrie [name], Carrie [name]                ACCOUNT NO.:  [id number]  MEDICAL RECORD NO.:  [id number]           PATIENT TYPE:  I  LOCATION:  3034                         FACILITY:  [facility]  PHYSICIAN:  Hewitt Shorts, M.D.DATE OF BIRTH:  [birth year]  DATE OF ADMISSION:  04/21/2011 DATE OF DISCHARGE:                             HISTORY & PHYSICAL   HISTORY OF PRESENT ILLNESS:  The patient is a 65 year old right-handed white female, who suffered a left L4-L5 lumbar disk herniation last year.  She presented for initial evaluation with me in November and underwent a left L4-L5 lumbar laminotomy and microdiskectomy in late December.  She did well during the initial postoperative period, but suffered recurrent disk herniation and underwent reoperation for recurrent disk herniation in February 2012.  Again, the patient did well following that surgery.  However, again last month, she developed recurrent pain.  She was restudied with MRI scan which revealed a large recurrent left L4-L5 lumbar disk herniation with a fragment that had migrated rostrally behind the body of L4 and, therefore, the patient is being admitted for diskectomy, but also stabilization, specifically a bilateral L4-L5 lumbar laminotomy, facetectomy, foraminotomy, and microdiskectomy; bilateral L4-L5 posterior lumbar interbody arthrodesis with interbody implants and bone graft and a bilateral L4-L5 posterolateral arthrodesis with posterior instrumentation and bone graft.  PAST MEDICAL HISTORY:  Notable for: 1. History of cardiac arrhythmia. 2. Also, benign colon tumor status post right hemicolectomy in 1998.     No history of cancer. 3. She does have a history of gastroesophageal reflux disease.  No     history of hypertension, myocardial function, stroke, diabetes, or     lung disease.  Previous surgery includes: 1. Right hemicolectomy in 1998. 2. Rectocele repair in 2001. 3. Left L4-L5 microdiskectomies in December  2011, and February 2012,     as described above.  She has no allergies to medications, but cannot take or receive Flexeril because of her antiarrhythmic, Norpace and the risk of the arrhythmias associated with taking Flexeril with Norpace.  Also intolerances to ERYTHROMYCIN and MACROBID which cause nausea, vomiting, and diarrhea.  Current medications include: 1. Lyrica 50 mg at bedtime. 2. Meclizine 25 mg t.i.d. p.r.n. 3. Tums 1 daily. 4. Allegra 180 mg daily p.r.n. 5. Colace 100 mg b.i.d. p.r.n. 6. Robaxin 500 mg at bedtime. 7. Vitamin D3, 2000 International Units daily. 8. Prevacid 50 mg b.i.d. 9. Norpace CR 150 mg 1-2 tablets t.i.d. 10.Hydrocodone p.r.n. for pain. 11.Aleve 2 tablets b.i.d.  FAMILY HISTORY:  Parents have passed on; mother at age 58, she had hypertension and osteoarthritis; father at age 32, he had cancer of the neck.  There is a family history of 3 sisters having hypertension and one sister having type 2 diabetes.  SOCIAL HISTORY:  The patient is married.  She is an Char[employer] fundraiser, she has worked for 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  She quit smoking at the end of last year.  She has an occasional alcoholic beverage.  She denies history of substance abuse.  REVIEW OF SYSTEMS:  Notable for as described in the history of present illness, past medical history, but is otherwise unremarkable.  PHYSICAL EXAMINATION:  GENERAL:  The patient is a well-developed, well- nourished white female in discomfort, but no acute distress. VITAL SIGNS:  Temperature 97.3, pulse 76, blood pressure 98/61, respiratory rate 20. LUNGS:  Clear to auscultation.  She has symmetric respiratory excursion. HEART:  Regular rate and rhythm with normal S1, S2.  There is no murmur. EXTREMITIES:  No clubbing, cyanosis, edema. MUSCULOSKELETAL:  Limitation of mobility due to discomfort. NEUROLOGIC:  5/5 strength to the lower extremities including  iliopsoas, quadriceps, dorsiflexors, extensor hallucis longus, and plantar flexors bilaterally.  Sensation is mildly decreased to pinprick in the medial aspect of the left foot.  Reflexes are minimal to quadriceps and gastrocnemius is symmetrical bilaterally.  Toes are downgoing bilaterally.  Her gait and stance both favor the left lower extremity.  IMPRESSION:  Large recurrent left L4-L5 lumbar disk herniation.  This is the second recurrence in less than 5 months.  PLAN:  The patient will be admitted for a bilateral L4-L5 lumbar decompression and arthrodesis as described above.  I have discussed the nature of her condition, the nature of the surgical procedure, typical risks of the surgery, hospital stay, and overall recuperation and limitations postoperatively, need for possible immobilization in lumbar brace, and risks including the risks of infection, bleeding, possibly transfusion, risk of nerve dysfunction, pain, weakness, numbness, or paresthesias, the risk of dural tear and CSF leakage, possible need for further surgery, risk of failure of the arthrodesis and possible need for further surgery, anesthetic risk, and myocardial function, stroke, pneumonia, and death.  Understanding all of this, she would like to proceed with surgery and is admitted for such.     Hewitt Shorts, M.D.     RWN/MEDQ  D:  04/23/2011  T:  04/23/2011  Job:  [id number]  Electronically Signed by Shirlean Kelly M.D. on 04/27/2011 05:30:48 PM

## 2011-08-19 ENCOUNTER — Other Ambulatory Visit: Payer: Self-pay | Admitting: Family Medicine

## 2011-08-19 DIAGNOSIS — Z1231 Encounter for screening mammogram for malignant neoplasm of breast: Secondary | ICD-10-CM

## 2011-10-03 ENCOUNTER — Ambulatory Visit
Admission: RE | Admit: 2011-10-03 | Discharge: 2011-10-03 | Disposition: A | Discharge status: Home / Self Care | Payer: Medicare Other | Source: Ambulatory Visit | Attending: Family Medicine | Admitting: Family Medicine

## 2011-10-03 DIAGNOSIS — Z1231 Encounter for screening mammogram for malignant neoplasm of breast: Secondary | ICD-10-CM

## 2012-01-03 DIAGNOSIS — J309 Allergic rhinitis, unspecified: Secondary | ICD-10-CM | POA: Insufficient documentation

## 2012-01-03 DIAGNOSIS — I493 Ventricular premature depolarization: Secondary | ICD-10-CM | POA: Insufficient documentation

## 2012-08-27 ENCOUNTER — Other Ambulatory Visit: Payer: Self-pay | Admitting: Family Medicine

## 2012-08-27 DIAGNOSIS — Z1231 Encounter for screening mammogram for malignant neoplasm of breast: Secondary | ICD-10-CM

## 2012-10-05 ENCOUNTER — Ambulatory Visit
Admission: RE | Admit: 2012-10-05 | Discharge: 2012-10-05 | Disposition: A | Discharge status: Home / Self Care | Payer: Medicare Other | Source: Ambulatory Visit | Attending: Family Medicine | Admitting: Family Medicine

## 2012-10-05 DIAGNOSIS — Z1231 Encounter for screening mammogram for malignant neoplasm of breast: Secondary | ICD-10-CM

## 2013-03-06 DIAGNOSIS — Z87891 Personal history of nicotine dependence: Secondary | ICD-10-CM | POA: Insufficient documentation

## 2013-03-06 DIAGNOSIS — L219 Seborrheic dermatitis, unspecified: Secondary | ICD-10-CM | POA: Insufficient documentation

## 2013-03-06 DIAGNOSIS — L210 Seborrhea capitis: Secondary | ICD-10-CM | POA: Insufficient documentation

## 2013-03-06 DIAGNOSIS — N390 Urinary tract infection, site not specified: Secondary | ICD-10-CM | POA: Insufficient documentation

## 2013-03-06 HISTORY — DX: Seborrheic dermatitis, unspecified: L21.9

## 2013-03-27 DIAGNOSIS — K589 Irritable bowel syndrome without diarrhea: Secondary | ICD-10-CM | POA: Insufficient documentation

## 2013-09-23 ENCOUNTER — Other Ambulatory Visit: Payer: Self-pay

## 2013-09-23 DIAGNOSIS — Z1231 Encounter for screening mammogram for malignant neoplasm of breast: Secondary | ICD-10-CM

## 2013-10-16 ENCOUNTER — Ambulatory Visit
Admission: RE | Admit: 2013-10-16 | Discharge: 2013-10-16 | Disposition: A | Discharge status: Home / Self Care | Payer: Medicare Other | Source: Ambulatory Visit

## 2013-10-16 DIAGNOSIS — Z1231 Encounter for screening mammogram for malignant neoplasm of breast: Secondary | ICD-10-CM

## 2013-12-09 ENCOUNTER — Ambulatory Visit: Payer: Medicare Other | Admitting: Internal Medicine

## 2013-12-26 ENCOUNTER — Ambulatory Visit (INDEPENDENT_AMBULATORY_CARE_PROVIDER_SITE_OTHER): Payer: Medicare Other | Admitting: Internal Medicine

## 2013-12-26 ENCOUNTER — Encounter: Payer: Self-pay | Admitting: Internal Medicine

## 2013-12-26 VITALS — BP 134/80 | HR 84 | Ht 63.5 in | Wt 170.0 lb

## 2013-12-26 DIAGNOSIS — I4949 Other premature depolarization: Secondary | ICD-10-CM

## 2013-12-26 DIAGNOSIS — R002 Palpitations: Secondary | ICD-10-CM

## 2013-12-26 DIAGNOSIS — I493 Ventricular premature depolarization: Secondary | ICD-10-CM

## 2013-12-26 MED ORDER — DISOPYRAMIDE PHOSPHATE ER 100 MG PO CP12: 100.0000 mg | ORAL_CAPSULE | Freq: Two times a day (BID) | ORAL | Status: DC

## 2013-12-26 NOTE — Patient Instructions (Signed)
Your physician recommends that you schedule a follow-up appointment in: 3 months with Dr Rayann Heman  Your physician has requested that you have an echocardiogram. Echocardiography is a painless test that uses sound waves to create images of your heart. It provides your doctor with information about the size and shape of your heart and how well your heart's chambers and valves are working. This procedure takes approximately one hour. There are no restrictions for this procedure.  Your physician has recommended you make the following change in your medication:  1) Decrease Norpace to 200mg  twice daily for 5 days, then decrease to 100mg  twice daily

## 2013-12-26 NOTE — Progress Notes (Signed)
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 female with a h/o palpitations who presents for EP consultation.  She reports palpitations since the 1970s.  She was evaluated by Dr Trilby Leaver and found to have frequent PVCs.  She was placed on inderol initially and subsequently quinidine.  She did not tolerate this due to diarrhea.  She has tried verapamil, metoprolol, and atenolol but did not tolerate these due to fatigue.  She was eventually started on norpace by Dr Pauline Aus in 1989.  She has tolerate this quite well without palpitations or side effects.  She has recently weaned her dose from 300/150/300mg  daily to 300mg  BID.  She continues to do well.  She has been informed by her pharmacy that she cannot get the controlled release until the end of March.  She has not been seen by a cardiologist in years.  She presents today for evaluation.  She reports rare "fluttering" lasting only a few seconds.  Today, she denies symptoms of prolonged palpitations, chest pain, shortness of breath, orthopnea, PND, lower extremity edema, dizziness, presyncope, syncope, or neurologic sequela.  She walks about 15 miles per week and weigh lifts twice per week without any limitation.                  The patient is tolerating medications without difficulties and is otherwise without complaint today.   Past Medical History  Diagnosis Date  . GERD (gastroesophageal reflux disease)   . Colon polyps   . Seborrheic dermatitis of scalp 03/06/13  . Recurrent UTI   . IBS (irritable bowel syndrome)   . Vitamin B 12 deficiency   . Fatigue   . Esophageal spasm   . Frequent PVCs   . Palpitations    Past Surgical History  Procedure Laterality Date  . Colectomy Right 1998    large colonic polyp  . Rectocele repair    . Spine surgery      3 since 11/2010, laminoectomy, lumbar fusion, diskectomy    Current Outpatient Prescriptions  Medication Sig Dispense Refill  . calcium carbonate (TUMS - DOSED IN MG ELEMENTAL  CALCIUM) 500 MG chewable tablet Chew 1-2 tablets by mouth daily.       . Cholecalciferol (VITAMIN D) 2000 UNITS tablet Take 1 tablet by mouth daily.      . Ciclopirox 1 % shampoo Apply 1 application topically as needed.      . clobetasol (TEMOVATE) 0.05 % external solution apply to affected area as directed      . disopyramide (NORPACE CR) 150 MG 12 hr capsule Take 300 mg by mouth 2 (two) times daily.       Marland Kitchen docusate sodium (COLACE) 100 MG capsule Take 100 mg by mouth daily.      . fexofenadine (ALLEGRA) 180 MG tablet Take 180 mg by mouth as needed.       . fluticasone (FLONASE) 50 MCG/ACT nasal spray Place 2 sprays into both nostrils as needed.       . hyoscyamine (LEVSIN/SL) 0.125 MG SL tablet Take 0.125 mg by mouth every 6 (six) hours as needed.      . pantoprazole (PROTONIX) 20 MG tablet Take 20 mg by mouth 2 (two) times daily.      . Probiotic Product (ACIDOPHILUS/GOAT MILK) CAPS Take 1 capsule by mouth daily.      . sucralfate (CARAFATE) 1 G tablet Take 1 tablet (1 g total) by mouth daily as needed.      Marland Kitchen  trimethoprim (TRIMPEX) 100 MG tablet One pill after sex may repeat in 12 hours and if symptoms are present may take one twice daily for three days       No current facility-administered medications for this visit.    Allergies  Allergen Reactions  . Azithromycin Other (See Comments)    Including macrobid  weakness  . Epinephrine Other (See Comments)    Heart arrythmias  . Erythromycin Diarrhea and Nausea And Vomiting  . Nitrofurantoin Nausea Only and Other (See Comments)    Weakness    History   Social History  . Marital Status: Married    Spouse Name: N/A    Number of Children: N/A  . Years of Education: N/A   Occupational History  . Not on file.   Social History Main Topics  . Smoking status: Former Smoker    Quit date: 02/02/2013  . Smokeless tobacco: Not on file  . Alcohol Use: Yes     Comment: 1 glass of wine per week  . Drug Use: No  . Sexual Activity: Not  on file   Other Topics Concern  . Not on file   Social History Narrative   Lives in [place] with spouse.   Volunteers as a Contractor    Family History  Problem Relation Age of Onset  . Hypertension Mother   . Hypertension Sister   . Hypertension Sister   . Hypertension Sister   . Hypertension Sister   . Cancer Sister     bladder  . Cancer Father   she denies FH of arrhythmias, syncope, heart disease, or sudden death  ROS- All systems are reviewed and negative except as per the HPI above  Physical Exam: Filed Vitals:   12/26/13 0855  BP: 134/80  Pulse: 84  Height: 5' 3.5" (1.613 m)  Weight: 170 lb (77.111 kg)    GEN- The patient is well appearing, alert and oriented x 3 today.   Head- normocephalic, atraumatic Eyes-  Sclera clear, conjunctiva pink Ears- hearing intact Oropharynx- clear Neck- supple, no JVP Lymph- no cervical lymphadenopathy Lungs- Clear to ausculation bilaterally, normal work of breathing Heart- Regular rate and rhythm, no murmurs, rubs or gallops, PMI not laterally displaced GI- soft, NT, ND, + BS Extremities- no clubbing, cyanosis, or edema MS- no significant deformity or atrophy Skin- no rash or lesion Psych- euthymic mood, full affect Neuro- strength and sensation are intact  EKG today reveals sinus rhythm 84 bpm, PR 182, QTc 486, otherwise normal ekg Over 30 pages of records are reviewed today  Assessment and Plan:  1. Palpitations/ PVCs Clinically doing very well with norpace.  Due to difficulty with obtaining the long acting form of this medicine, she presents for cardiology follow-up. She has been on norpace for years.  I think that a prudent strategy would be to try to wean this medicine going forward. She will decrease norpace to 200mg  BID x 1 week then 100mg  BID.  IF she does well with this strategy then we may even stop this medicine upon return.  If her arrhythmia burden increases then we could consider flecainide  or even ablation. I will obtain an echo at this point to evaluate for structural heart disease. She will return in 6 weeks

## 2014-01-15 ENCOUNTER — Ambulatory Visit (HOSPITAL_COMMUNITY): Payer: Medicare Other | Attending: Cardiology | Admitting: Cardiology

## 2014-01-15 DIAGNOSIS — I359 Nonrheumatic aortic valve disorder, unspecified: Secondary | ICD-10-CM | POA: Insufficient documentation

## 2014-01-15 DIAGNOSIS — R002 Palpitations: Secondary | ICD-10-CM | POA: Insufficient documentation

## 2014-01-15 DIAGNOSIS — I379 Nonrheumatic pulmonary valve disorder, unspecified: Secondary | ICD-10-CM | POA: Insufficient documentation

## 2014-01-15 DIAGNOSIS — I493 Ventricular premature depolarization: Secondary | ICD-10-CM

## 2014-01-15 DIAGNOSIS — I4949 Other premature depolarization: Secondary | ICD-10-CM

## 2014-01-15 NOTE — Progress Notes (Signed)
Echo performed. 

## 2014-02-05 ENCOUNTER — Ambulatory Visit: Payer: Medicare Other | Admitting: Internal Medicine

## 2014-02-10 ENCOUNTER — Telehealth: Payer: Self-pay | Admitting: Internal Medicine

## 2014-02-10 MED ORDER — DISOPYRAMIDE PHOSPHATE 100 MG PO CAPS: 100.0000 mg | ORAL_CAPSULE | Freq: Three times a day (TID) | ORAL | Status: DC

## 2014-02-10 NOTE — Telephone Encounter (Signed)
New message          Pt would like to know if she can use generic norpace without the CR?

## 2014-02-10 NOTE — Telephone Encounter (Signed)
  Discussed with Dr Rayann Heman and will change to generic Norpace 100mg  tid.  He would prefer she be on the least as possible.  This is the closest we can get to what she is taking now.  If the CR comes in she will call us and go back to 100mg  bid

## 2014-03-05 ENCOUNTER — Ambulatory Visit (INDEPENDENT_AMBULATORY_CARE_PROVIDER_SITE_OTHER): Payer: Medicare Other | Admitting: Internal Medicine

## 2014-03-05 ENCOUNTER — Encounter: Payer: Self-pay | Admitting: Internal Medicine

## 2014-03-05 VITALS — BP 153/75 | HR 75 | Ht 63.5 in | Wt 170.0 lb

## 2014-03-05 DIAGNOSIS — I10 Essential (primary) hypertension: Secondary | ICD-10-CM | POA: Insufficient documentation

## 2014-03-05 DIAGNOSIS — I493 Ventricular premature depolarization: Secondary | ICD-10-CM

## 2014-03-05 DIAGNOSIS — I4949 Other premature depolarization: Secondary | ICD-10-CM

## 2014-03-05 MED ORDER — NORPACE CR 100 MG PO CP12
100.0000 mg | ORAL_CAPSULE | Freq: Two times a day (BID) | ORAL | Status: DC
Start: 2014-03-05 — End: 2014-07-22

## 2014-03-05 NOTE — Progress Notes (Signed)
PCP:  Leamon Arnt, MD  The patient presents today for routine electrophysiology followup.  Since last being seen in our clinic, the patient reports doing very well.  Today, she denies symptoms of chest pain, shortness of breath, orthopnea, PND, lower extremity edema, dizziness, presyncope, syncope, or neurologic sequela.  Her palpitations are controlled with norpace.  The patient feels that she is tolerating medications without difficulties and is otherwise without complaint today.   Past Medical History  Diagnosis Date  . GERD (gastroesophageal reflux disease)   . Colon polyps   . Seborrheic dermatitis of scalp 03/06/13  . Recurrent UTI   . IBS (irritable bowel syndrome)   . Vitamin B 12 deficiency   . Fatigue   . Esophageal spasm   . Frequent PVCs   . Palpitations    Past Surgical History  Procedure Laterality Date  . Colectomy Right 1998    large colonic polyp  . Rectocele repair    . Spine surgery      3 since 11/2010, laminoectomy, lumbar fusion, diskectomy    Current Outpatient Prescriptions  Medication Sig Dispense Refill  . calcium carbonate (TUMS CALCIUM FOR LIFE BONE) 750 MG chewable tablet Chew 1 tablet by mouth 2 (two) times daily.      . Cholecalciferol (VITAMIN D) 2000 UNITS tablet Take 1 tablet by mouth daily.      . Ciclopirox 1 % shampoo Apply 1 application topically as needed.      . clobetasol (TEMOVATE) 0.05 % external solution apply to affected area as directed      . disopyramide (NORPACE) 100 MG capsule Take 1 capsule (100 mg total) by mouth 3 (three) times daily.  90 capsule  3  . docusate sodium (COLACE) 100 MG capsule Take 100 mg by mouth daily.      . fexofenadine (ALLEGRA) 180 MG tablet Take 180 mg by mouth as needed.       . fluticasone (FLONASE) 50 MCG/ACT nasal spray Place 2 sprays into both nostrils as needed.       . hyoscyamine (LEVSIN/SL) 0.125 MG SL tablet Take 0.125 mg by mouth every 6 (six) hours as needed.      . pantoprazole (PROTONIX)  20 MG tablet Take 20 mg by mouth 2 (two) times daily.      . Probiotic Product (ACIDOPHILUS/GOAT MILK) CAPS Take 1 capsule by mouth daily.      . sucralfate (CARAFATE) 1 G tablet Take 1 tablet (1 g total) by mouth daily as needed.      . trimethoprim (TRIMPEX) 100 MG tablet One pill after sex may repeat in 12 hours and if symptoms are present may take one twice daily for three days       No current facility-administered medications for this visit.    Allergies  Allergen Reactions  . Azithromycin Other (See Comments)    Including macrobid  weakness  . Epinephrine Other (See Comments)    Heart arrythmias  . Erythromycin Diarrhea and Nausea And Vomiting  . Nitrofurantoin Nausea Only and Other (See Comments)    Weakness    History   Social History  . Marital Status: Married    Spouse Name: N/A    Number of Children: N/A  . Years of Education: N/A   Occupational History  . Not on file.   Social History Main Topics  . Smoking status: Former Smoker    Quit date: 02/02/2013  . Smokeless tobacco: Not on file  . Alcohol Use: Yes  Comment: 1 glass of wine per week  . Drug Use: No  . Sexual Activity: Not on file   Other Topics Concern  . Not on file   Social History Narrative   Lives in [place] with spouse.   Volunteers as a Contractor    Family History  Problem Relation Age of Onset  . Hypertension Mother   . Hypertension Sister   . Hypertension Sister   . Hypertension Sister   . Hypertension Sister   . Cancer Sister     bladder  . Cancer Father     ROS-  All systems are reviewed and are negative except as outlined in the HPI above  Physical Exam: Filed Vitals:   03/05/14 0917  BP: 153/75  Pulse: 75  Height: 5' 3.5" (1.613 m)  Weight: 170 lb (77.111 kg)    GEN- The patient is well appearing, alert and oriented x 3 today.   Head- normocephalic, atraumatic Eyes-  Sclera clear, conjunctiva pink Ears- hearing intact Oropharynx-  clear Neck- supple, no JVP Lymph- no cervical lymphadenopathy Lungs- Clear to ausculation bilaterally, normal work of breathing Heart- Regular rate and rhythm, no murmurs, rubs or gallops, PMI not laterally displaced GI- soft, NT, ND, + BS Extremities- no clubbing, cyanosis, or edema MS- no significant deformity or atrophy Skin- no rash or lesion Psych- euthymic mood, full affect Neuro- strength and sensation are intact  ekg today reveals sinus rhythm 75 bpm, otherwise normal ekg Echo is reviewed  Assessment and Plan:  1. Palpitations/ pvcs Well controlled with norpace.  She would like to go back to Norpace CR 100mg  BID No further workup planned at this time  2. htn Elevated BP today.  She feels strongly that her BP is controlled at home.  I have therefore encouraged her to follow her bp at home and follow-up with primary care if elevated  Return to see me in 1 year

## 2014-03-05 NOTE — Patient Instructions (Signed)
Your physician wants you to follow-up in: 12 months with Dr Allred You will receive a reminder letter in the mail two months in advance. If you don't receive a letter, please call our office to schedule the follow-up appointment.  

## 2014-05-05 ENCOUNTER — Other Ambulatory Visit: Payer: Self-pay | Admitting: Family Medicine

## 2014-05-05 DIAGNOSIS — E2839 Other primary ovarian failure: Secondary | ICD-10-CM

## 2014-05-12 ENCOUNTER — Ambulatory Visit
Admission: RE | Admit: 2014-05-12 | Discharge: 2014-05-12 | Disposition: A | Discharge status: Home / Self Care | Payer: Medicare Other | Source: Ambulatory Visit | Attending: Family Medicine | Admitting: Family Medicine

## 2014-05-12 DIAGNOSIS — E2839 Other primary ovarian failure: Secondary | ICD-10-CM

## 2014-05-28 DIAGNOSIS — M8580 Other specified disorders of bone density and structure, unspecified site: Secondary | ICD-10-CM | POA: Insufficient documentation

## 2014-07-18 ENCOUNTER — Telehealth: Payer: Self-pay | Admitting: Internal Medicine

## 2014-07-18 NOTE — Telephone Encounter (Signed)
Attempted to reach the pt but no answer. 

## 2014-07-18 NOTE — Telephone Encounter (Signed)
New message     Norpace cr 100 is on back order---can pt get a presc for norpace cr 150 to take twice a day?   [facility].  Please call pt

## 2014-07-22 MED ORDER — DISOPYRAMIDE PHOSPHATE ER 150 MG PO CP12: 150.0000 mg | ORAL_CAPSULE | Freq: Two times a day (BID) | ORAL | Status: DC

## 2014-07-22 NOTE — Telephone Encounter (Signed)
Called patient. She states that her pharm has advised her that Norpace currently does not come in 100 mg tabs. She has been back and forth between 100mg  TID and 150mg  BID and 100mg  BID based on available. States BP has been running 128/74 and 122/68 with HR between 80 and 88. Advise will send in script for Norpace CR  150mg  BID. Will let Dr. Rayann Heman know that she called.

## 2014-09-22 ENCOUNTER — Other Ambulatory Visit: Payer: Self-pay

## 2014-09-22 ENCOUNTER — Telehealth: Payer: Self-pay | Admitting: Internal Medicine

## 2014-09-22 DIAGNOSIS — Z1231 Encounter for screening mammogram for malignant neoplasm of breast: Secondary | ICD-10-CM

## 2014-09-22 DIAGNOSIS — Z1239 Encounter for other screening for malignant neoplasm of breast: Secondary | ICD-10-CM

## 2014-09-22 NOTE — Telephone Encounter (Signed)
New problem   Pt can't get long acting Norpace, they only have the shorter acting. Pt need a new prescription for 3 month supply. Please call pt if any questions. [facility].

## 2014-09-24 MED ORDER — DISOPYRAMIDE PHOSPHATE 100 MG PO CAPS: 100.0000 mg | ORAL_CAPSULE | Freq: Three times a day (TID) | ORAL | Status: DC

## 2014-09-24 NOTE — Telephone Encounter (Signed)
Spoke with [facility] Pharm [facility] said to change to 100mg  every 8 hours

## 2014-09-24 NOTE — Telephone Encounter (Signed)
Follow up     Pt did not hear back regarding changing norpace to a different medication because the manufacturer changed

## 2014-09-26 ENCOUNTER — Telehealth: Payer: Self-pay | Admitting: Internal Medicine

## 2014-09-26 NOTE — Telephone Encounter (Signed)
Spoke with patient and let her know I will be in touch with her on Monday

## 2014-09-26 NOTE — Telephone Encounter (Signed)
Follow up     Talk to [facility] regarding prior authorization for her norpace.  She has more information to give.

## 2014-09-26 NOTE — Telephone Encounter (Signed)
Follow up     Patient calling to speak with the nurse regarding prior autho

## 2014-09-26 NOTE — Telephone Encounter (Signed)
[phone]  To get PA for Norpace 100mg  q 8 hours as the Norpace CR is on back order  It is now being sent for review.  The ref # is [id number]  I have left a message  for the patient with the information, Gay Filler says she will call Monday if nedded

## 2014-09-29 NOTE — Telephone Encounter (Signed)
Called today and said it was denied.  Can you help me.

## 2014-09-30 NOTE — Telephone Encounter (Signed)
Spoke with insurance company.  PA denied because they had not received information regarding diagnosis and previous therapies.  Since it was already denied, they would not let me appeal over the phone; will have to fax in.  Will fax OV but pt has been on norpace since 1989 so old records not available.  Will try to complete phone appeal if fax is denied.  Pt is aware of where we are in the process.  She has a few days of norpace still available.

## 2014-09-30 NOTE — Telephone Encounter (Signed)
PA appeal faxed

## 2014-10-03 NOTE — Telephone Encounter (Signed)
Follow up     For Gay Filler Did you get a prior authorization on her medications?

## 2014-10-09 NOTE — Telephone Encounter (Signed)
Spoke with insurance company.  PA was approved after sending in additional documentation.  Pt has already picked up her prescription from [facility]

## 2014-10-17 ENCOUNTER — Encounter (INDEPENDENT_AMBULATORY_CARE_PROVIDER_SITE_OTHER): Payer: Self-pay

## 2014-10-17 ENCOUNTER — Ambulatory Visit
Admission: RE | Admit: 2014-10-17 | Discharge: 2014-10-17 | Disposition: A | Discharge status: Home / Self Care | Payer: Medicare Other | Source: Ambulatory Visit

## 2014-10-17 DIAGNOSIS — Z1231 Encounter for screening mammogram for malignant neoplasm of breast: Secondary | ICD-10-CM

## 2015-02-10 ENCOUNTER — Other Ambulatory Visit: Payer: Self-pay

## 2015-02-10 ENCOUNTER — Telehealth: Payer: Self-pay | Admitting: Internal Medicine

## 2015-02-10 NOTE — Telephone Encounter (Signed)
24-72 hours should know if approved ref# [id number] should know   We do have a letter stating it had been approved until 12/05/15 and they are not adhering to the letter.  The patient has a copy if needed.  Bill with [facility] says he can see the letter on his side and we should hear back with decision soon  Patient aware

## 2015-02-10 NOTE — Telephone Encounter (Signed)
[facility] called at 215pm and denied her rx for norpace cr , pls advise [phone]

## 2015-02-10 NOTE — Telephone Encounter (Signed)
New message      Pt c/o medication issue:  1. Name of Medication: norpace CR 2. How are you currently taking this medication (dosage and times per day)? 100mg ---1 tab bid 3. Are you having a reaction (difficulty breathing--STAT)? no 4. What is your medication issue? Ins is giving her a hard time paying.  It needs prior approval

## 2015-02-11 ENCOUNTER — Encounter: Payer: Self-pay | Admitting: *Deleted

## 2015-02-11 NOTE — Telephone Encounter (Signed)
Letter done and faxed for appeal

## 2015-02-16 ENCOUNTER — Encounter: Payer: Self-pay | Admitting: Internal Medicine

## 2015-02-16 ENCOUNTER — Ambulatory Visit (INDEPENDENT_AMBULATORY_CARE_PROVIDER_SITE_OTHER): Payer: Medicare Other | Admitting: Internal Medicine

## 2015-02-16 ENCOUNTER — Other Ambulatory Visit: Payer: Self-pay

## 2015-02-16 VITALS — BP 134/76 | HR 79 | Ht 63.5 in | Wt 173.8 lb

## 2015-02-16 DIAGNOSIS — I10 Essential (primary) hypertension: Secondary | ICD-10-CM | POA: Diagnosis not present

## 2015-02-16 DIAGNOSIS — I493 Ventricular premature depolarization: Secondary | ICD-10-CM | POA: Diagnosis not present

## 2015-02-16 NOTE — Patient Instructions (Signed)
Your physician wants you to follow-up in: 12 months with Dr Vallery Ridge will receive a reminder letter in the mail two months in advance. If you don't receive a letter, please call our office to schedule the follow-up appointment.   Your physician recommends that you continue on your current medications as directed. Please refer to the Current Medication list given to you today.

## 2015-02-16 NOTE — Progress Notes (Signed)
Electrophysiology Office Note   Date:  02/16/2015   ID:  Carrie [name], DOB [birth year], MRN [id number]  PCP:  Leamon Arnt, MD   Primary Electrophysiologist: Thompson Grayer, MD    Chief Complaint  Patient presents with  . Follow-up    PVCs     History of Present Illness: Carrie [name] is a 69 y.o. female who presents today for electrophysiology evaluation.   She has done well over the past year.  Her PVCs are well controlled with norpace.  Her BP is stable.  She has rare palpitations.  She has chronic back pain  Today, she denies symptoms of chest pain, shortness of breath, orthopnea, PND, lower extremity edema, claudication, dizziness, presyncope, syncope, bleeding, or neurologic sequela. The patient is tolerating medications without difficulties and is otherwise without complaint today.    Past Medical History  Diagnosis Date  . GERD (gastroesophageal reflux disease)   . Colon polyps   . Seborrheic dermatitis of scalp 03/06/13  . Recurrent UTI   . IBS (irritable bowel syndrome)   . Vitamin B 12 deficiency   . Fatigue   . Esophageal spasm   . Frequent PVCs   . Palpitations    Past Surgical History  Procedure Laterality Date  . Colectomy Right 1998    large colonic polyp  . Rectocele repair    . Spine surgery      3 since 11/2010, laminoectomy, lumbar fusion, diskectomy     Current Outpatient Prescriptions  Medication Sig Dispense Refill  . calcium carbonate (TUMS CALCIUM FOR LIFE BONE) 750 MG chewable tablet Chew 1 tablet by mouth 2 (two) times daily.    . Cholecalciferol (VITAMIN D) 2000 UNITS tablet Take 1 tablet by mouth daily.    . clobetasol (TEMOVATE) 0.05 % external solution Apply one application to affected area as directed    . docusate sodium (COLACE) 100 MG capsule Take 100 mg by mouth daily.    . fexofenadine (ALLEGRA) 180 MG tablet Take 180 mg by mouth daily as needed for allergies or rhinitis.     . hyoscyamine (LEVSIN/SL) 0.125 MG SL tablet Take  0.125 mg by mouth every 6 (six) hours as needed.    . NORPACE CR 100 MG 12 hr capsule Take 1 capsule by mouth 2 (two) times daily.  1  . pantoprazole (PROTONIX) 20 MG tablet Take 20 mg by mouth 2 (two) times daily.    . Probiotic Product (ACIDOPHILUS/GOAT MILK) CAPS Take 1 capsule by mouth daily.    . sucralfate (CARAFATE) 1 G tablet Take 1 tablet (1 g total) by mouth daily as needed for stomach issues    . trimethoprim (TRIMPEX) 100 MG tablet One pill after sex may repeat in 12 hours and if symptoms are present may take one twice daily for three days    . fluticasone (FLONASE) 50 MCG/ACT nasal spray Place 2 sprays into both nostrils daily as needed for allergies or rhinitis.      No current facility-administered medications for this visit.    Allergies:   Azithromycin; Epinephrine; Erythromycin; and Nitrofurantoin   Social History:  The patient  reports that she quit smoking about 2 years ago. She does not have any smokeless tobacco history on file. She reports that she drinks alcohol. She reports that she does not use illicit drugs.   Family History:  The patient's  family history includes Cancer in her father and sister; Hypertension in her mother, sister, sister, sister, and sister.  ROS:  Please see the history of present illness.   All other systems are reviewed and negative.    PHYSICAL EXAM: VS:  BP 134/76 mmHg  Pulse 79  Ht 5' 3.5" (1.613 m)  Wt 173 lb 12.8 oz (78.835 kg)  BMI 30.30 kg/m2 , BMI Body mass index is 30.3 kg/(m^2). GEN: Well nourished, well developed, in no acute distress HEENT: normal Neck: no JVD, carotid bruits, or masses Cardiac: RRR; no murmurs, rubs, or gallops,no edema  Respiratory:  clear to auscultation bilaterally, normal work of breathing GI: soft, nontender, nondistended, + BS MS: no deformity or atrophy Skin: warm and dry  Neuro:  Strength and sensation are intact Psych: euthymic mood, full affect  EKG:  EKG is ordered today. The ekg ordered  today shows sinus rhythm, normal ekg   Recent Labs: Labs from pcp including lipids and lfts are reviewed and will be scanned into epic   Lipid Panel  No results found for: CHOL, TRIG, HDL, CHOLHDL, VLDL, LDLCALC, LDLDIRECT   Wt Readings from Last 3 Encounters:  02/16/15 173 lb 12.8 oz (78.835 kg)  03/05/14 170 lb (77.111 kg)  12/26/13 170 lb (77.111 kg)      Other studies Reviewed: Additional studies/ records that were reviewed today include: echo from 2/15 is reviewed with patient   ASSESSMENT AND PLAN:  1. Palpitations/ pvcs Well controlled with norpace CR 100mg  BID.  I have encouraged her to try to wean herself off of this medicine. No further workup planned at this time  2. htn Stable No change required today   Return to see me in 1 year    Current medicines are reviewed at length with the patient today.   The patient does not have concerns regarding her medicines.  The following changes were made today:  none   Signed, Thompson Grayer, MD  02/16/2015 8:41 AM     [facility] [address] [place] [address] [phone] (office) [phone] (fax)

## 2015-03-19 ENCOUNTER — Ambulatory Visit (INDEPENDENT_AMBULATORY_CARE_PROVIDER_SITE_OTHER): Payer: Medicare Other | Admitting: Podiatry

## 2015-03-19 ENCOUNTER — Ambulatory Visit (INDEPENDENT_AMBULATORY_CARE_PROVIDER_SITE_OTHER): Payer: Medicare Other

## 2015-03-19 ENCOUNTER — Encounter: Payer: Self-pay | Admitting: Podiatry

## 2015-03-19 VITALS — BP 135/73 | HR 101 | Resp 13 | Ht 63.5 in | Wt 165.0 lb

## 2015-03-19 DIAGNOSIS — M778 Other enthesopathies, not elsewhere classified: Secondary | ICD-10-CM

## 2015-03-19 DIAGNOSIS — M79673 Pain in unspecified foot: Secondary | ICD-10-CM

## 2015-03-19 DIAGNOSIS — M7750 Other enthesopathy of unspecified foot: Secondary | ICD-10-CM | POA: Diagnosis not present

## 2015-03-19 DIAGNOSIS — M19079 Primary osteoarthritis, unspecified ankle and foot: Secondary | ICD-10-CM

## 2015-03-19 DIAGNOSIS — M779 Enthesopathy, unspecified: Secondary | ICD-10-CM

## 2015-03-19 MED ORDER — MELOXICAM 15 MG PO TABS: 15.0000 mg | ORAL_TABLET | Freq: Every day | ORAL | Status: DC

## 2015-03-19 NOTE — Progress Notes (Signed)
   Subjective:    Patient ID: Carrie [name], female    DOB: 25-Jun-1946, 69 y.o.   MRN: [id number]  HPI Comments: Pt states she has been dealing with foot and ankle pain for 6-8 years and was previously attributed to back problems, but now occurs all the time with or without activity.     Review of Systems  Cardiovascular: Positive for palpitations.       Pt is currently on medication for this.  Neurological: Positive for numbness.  All other systems reviewed and are negative.      Objective:   Physical Exam: I have reviewed her past medical history medications allergy surgery social history and review of systems. Pulses are strongly palpable bilateral. Neurologic sensorium is intact per Semmes-Weinstein monofilament. Deep tendon reflexes appear to be intact muscle strength +5 over 5 dorsiflexion plantar flexors inverters and everters all intrinsic musculature is intact. Orthopedic evaluation demonstrates tight Achilles tendons bilateral. She has pain on dorsiflexion and plantarflexion of the the ankles bilaterally however the right seems to be worse. She has no crepitation on range of motion. She has severe pain on palpation of the sinus tarsi bilaterally as well as severe pain on inversion and eversion of the subtalar joint. Radiographic evaluation of the bilateral ankles does not demonstrate any type of osseus abnormalities within the ankle joints themselves subtalar joint is questionable from some early osteoarthritic changes and joint space narrowing. This could be consistent with degenerative joint disease of the subtalar joint or a more severe capsulitis. Cutaneous evaluation demonstrates supple well-hydrated cutis no erythema edema cellulitis drainage or odor.        Assessment & Plan:  Assessment: Subtalar joint capsulitis/degenerative joint disease bilateral.  Plan: Discussed etiology pathology conservative versus surgical therapies. After sterile Betadine skin prep injected the  bilateral subtalar joints today with Kenalog and local anesthetic. We discussed appropriate shoe gear stretching exercises ice therapy and shoe gear modifications. I wrote her prescription for meloxicam. 1 tablet 15 mg daily

## 2015-03-23 ENCOUNTER — Ambulatory Visit: Payer: Medicare Other | Admitting: Podiatry

## 2015-03-25 ENCOUNTER — Other Ambulatory Visit: Payer: Self-pay | Admitting: Internal Medicine

## 2015-04-09 ENCOUNTER — Ambulatory Visit (INDEPENDENT_AMBULATORY_CARE_PROVIDER_SITE_OTHER): Payer: Medicare Other | Admitting: Podiatry

## 2015-04-09 ENCOUNTER — Encounter: Payer: Self-pay | Admitting: Podiatry

## 2015-04-09 VITALS — BP 122/59 | HR 89 | Resp 16

## 2015-04-09 DIAGNOSIS — M19079 Primary osteoarthritis, unspecified ankle and foot: Secondary | ICD-10-CM | POA: Diagnosis not present

## 2015-04-09 DIAGNOSIS — M778 Other enthesopathies, not elsewhere classified: Secondary | ICD-10-CM

## 2015-04-09 DIAGNOSIS — M7750 Other enthesopathy of unspecified foot: Secondary | ICD-10-CM | POA: Diagnosis not present

## 2015-04-09 DIAGNOSIS — M779 Enthesopathy, unspecified: Secondary | ICD-10-CM

## 2015-04-09 MED ORDER — DICLOFENAC SODIUM 1 % TD GEL: 4.0000 g | Freq: Four times a day (QID) | TRANSDERMAL | Status: DC

## 2015-04-09 NOTE — Progress Notes (Signed)
She presents today for follow-up of her subtalar joint capsulitis bilaterally. She states that the shot deathly help and she has had some recurrence to the right subtalar joint. She states that I still have some tenderness in the right ankle as she points to the fibula.  Objective: Vital signs are stable she is alert and oriented 3. She was unable to take the meloxicam. She has pain on palpation to the fibular gutters of the bilateral ankles right greater than left.  Assessment: Lateral ankle capsulitis and subtalar joint capsulitis bilateral.  Plan: Injected with dexamethasone after sterile Betadine skin prep the fibular gutters today. Also wrote a prescription for diclofenac gel and I will follow up with her when she returns from [place].

## 2015-04-27 ENCOUNTER — Telehealth: Payer: Self-pay | Admitting: *Deleted

## 2015-04-27 NOTE — Telephone Encounter (Signed)
I sent request for authorization of Diclofenac Gel 1% to [facility] on 04/24/2015.  Prescription was authorized until 04/22/2016.  Authorization number is [id number].Marland Kitchen

## 2015-05-02 ENCOUNTER — Encounter (HOSPITAL_COMMUNITY): Payer: Self-pay | Admitting: *Deleted

## 2015-05-02 ENCOUNTER — Emergency Department (INDEPENDENT_AMBULATORY_CARE_PROVIDER_SITE_OTHER)
Admission: EM | Admit: 2015-05-02 | Discharge: 2015-05-02 | Disposition: A | Discharge status: Home / Self Care | Payer: Medicare Other | Source: Home / Self Care | Attending: Family Medicine | Admitting: Family Medicine

## 2015-05-02 ENCOUNTER — Emergency Department (INDEPENDENT_AMBULATORY_CARE_PROVIDER_SITE_OTHER): Payer: Medicare Other

## 2015-05-02 DIAGNOSIS — J209 Acute bronchitis, unspecified: Secondary | ICD-10-CM | POA: Diagnosis not present

## 2015-05-02 MED ORDER — AMOXICILLIN-POT CLAVULANATE 875-125 MG PO TABS: 1.0000 | ORAL_TABLET | Freq: Two times a day (BID) | ORAL | Status: DC

## 2015-05-02 NOTE — ED Notes (Signed)
Pt  Reports   Symptoms  Of  cough   Congested  As   Well  As    sorethroat      X  6   Days     Pt  Reports  Taking  amox            Recently  Returned  fro  [employer]

## 2015-05-02 NOTE — ED Provider Notes (Signed)
CSN: [id number]     Arrival date & time 05/02/15  1217 History   First MD Initiated Contact with Patient 05/02/15 1230     Chief Complaint  Patient presents with  . URI   (Consider location/radiation/quality/duration/timing/severity/associated sxs/prior Treatment) Patient is a 69 y.o. female presenting with URI. The history is provided by the patient.  URI Presenting symptoms: congestion, cough and rhinorrhea   Presenting symptoms: no fever   Presenting symptoms comment:  Just back from trip to [place], got sick and went to hosp and was given 4 d of amox until return, head and chest cong. Severity:  Moderate Onset quality:  Gradual Duration:  6 days Progression:  Improving Chronicity:  New Associated symptoms: no wheezing     Past Medical History  Diagnosis Date  . GERD (gastroesophageal reflux disease)   . Colon polyps   . Seborrheic dermatitis of scalp 03/06/13  . Recurrent UTI   . IBS (irritable bowel syndrome)   . Vitamin B 12 deficiency   . Fatigue   . Esophageal spasm   . Frequent PVCs   . Palpitations    Past Surgical History  Procedure Laterality Date  . Colectomy Right 1998    large colonic polyp  . Rectocele repair    . Spine surgery      3 since 11/2010, laminoectomy, lumbar fusion, diskectomy   Family History  Problem Relation Age of Onset  . Hypertension Mother   . Hypertension Sister   . Hypertension Sister   . Hypertension Sister   . Hypertension Sister   . Cancer Sister     bladder  . Cancer Father    History  Substance Use Topics  . Smoking status: Former Smoker    Quit date: 02/02/2013  . Smokeless tobacco: Not on file  . Alcohol Use: Yes     Comment: 1 glass of wine per week   OB History    No data available     Review of Systems  Constitutional: Negative.  Negative for fever.  HENT: Positive for congestion, postnasal drip and rhinorrhea.   Respiratory: Positive for cough and chest tightness. Negative for shortness of breath and  wheezing.   Cardiovascular: Negative.   Gastrointestinal: Negative.     Allergies  Azithromycin; Epinephrine; Erythromycin; and Nitrofurantoin  Home Medications   Prior to Admission medications   Medication Sig Start Date End Date Taking? Authorizing Provider  amoxicillin-clavulanate (AUGMENTIN) 875-125 MG per tablet Take 1 tablet by mouth 2 (two) times daily. 05/02/15   Billy Fischer, MD  calcium carbonate (TUMS CALCIUM FOR LIFE BONE) 750 MG chewable tablet Chew 1 tablet by mouth 2 (two) times daily.    Historical Provider, MD  Cholecalciferol (VITAMIN D) 2000 UNITS tablet Take 1 tablet by mouth daily.    Historical Provider, MD  clobetasol (TEMOVATE) 0.05 % external solution Apply one application to affected area as directed 03/06/13   Historical Provider, MD  diclofenac sodium (VOLTAREN) 1 % GEL Apply 4 g topically 4 (four) times daily. 04/09/15   Max T Hyatt, DPM  docusate sodium (COLACE) 100 MG capsule Take 100 mg by mouth daily.    Historical Provider, MD  fexofenadine (ALLEGRA) 180 MG tablet Take 180 mg by mouth daily as needed for allergies or rhinitis.     Historical Provider, MD  fluticasone (FLONASE) 50 MCG/ACT nasal spray Place 2 sprays into both nostrils daily as needed for allergies or rhinitis.  03/06/13 03/06/14  Historical Provider, MD  hyoscyamine (LEVSIN/SL) 0.125  MG SL tablet Take 0.125 mg by mouth every 6 (six) hours as needed. 03/06/13   Historical Provider, MD  meloxicam (MOBIC) 15 MG tablet Take 1 tablet (15 mg total) by mouth daily. 03/19/15   Max T Hyatt, DPM  NORPACE CR 100 MG 12 hr capsule Take 1 capsule by mouth 2 (two) times daily. 02/02/15   Historical Provider, MD  NORPACE CR 100 MG 12 hr capsule take 1 capsule by mouth twice a day 03/26/15   Thompson Grayer, MD  pantoprazole (PROTONIX) 20 MG tablet Take 20 mg by mouth 2 (two) times daily. 03/06/13 02/16/15  Historical Provider, MD  Probiotic Product (ACIDOPHILUS/GOAT MILK) CAPS Take 1 capsule by mouth daily.    Historical  Provider, MD  Ranitidine HCl (ZANTAC PO) Take by mouth.    Historical Provider, MD  sucralfate (CARAFATE) 1 G tablet Take 1 tablet (1 g total) by mouth daily as needed for stomach issues 03/06/13   Historical Provider, MD  trimethoprim (TRIMPEX) 100 MG tablet One pill after sex may repeat in 12 hours and if symptoms are present may take one twice daily for three days 03/06/13   Historical Provider, MD   BP 154/86 mmHg  Pulse 78  Temp(Src) 98.6 F (37 C) (Oral)  Resp 16  SpO2 98% Physical Exam  Constitutional: She is oriented to person, place, and time. She appears well-developed and well-nourished.  HENT:  Right Ear: External ear normal.  Left Ear: External ear normal.  Mouth/Throat: Oropharynx is clear and moist.  Eyes: Conjunctivae are normal. Pupils are equal, round, and reactive to light.  Neck: Normal range of motion. Neck supple.  Pulmonary/Chest: Effort normal. She has decreased breath sounds. She has wheezes. She has rhonchi. She has no rales.  Neurological: She is alert and oriented to person, place, and time.  Skin: Skin is warm and dry.  Nursing note and vitals reviewed.   ED Course  Procedures (including critical care time) Labs Review Labs Reviewed - No data to display  Imaging Review Dg Chest 2 View  05/02/2015   CLINICAL DATA:  Pt been sick since last Sunday, a lot of head and chest congestion, [place], pt was given penicillen, enough to get home, fever unknown, hx of smoking,no bronchitis no pneumonia, she was told in 2011 that she has the beginnings of COPD  EXAM: CHEST  2 VIEW  COMPARISON:  11/10/2010  FINDINGS: Cardiac silhouette is normal in size. Normal mediastinal and hilar contours.  There are mildly prominent markings medial lung bases, likely scarring, stable. No lung consolidation or edema. No pleural effusion or pneumothorax.  Bony thorax is intact.  IMPRESSION: No active cardiopulmonary disease.   Electronically Signed   By: Lajean Manes M.D.   On: 05/02/2015 13:49   X-rays reviewed and report per radiologist.   MDM   1. Acute bronchitis, unspecified organism        Billy Fischer, MD 05/02/15 [phone]

## 2015-05-07 ENCOUNTER — Encounter: Payer: Self-pay | Admitting: Podiatry

## 2015-05-07 ENCOUNTER — Ambulatory Visit (INDEPENDENT_AMBULATORY_CARE_PROVIDER_SITE_OTHER): Payer: Medicare Other | Admitting: Podiatry

## 2015-05-07 VITALS — BP 148/77 | HR 73 | Resp 16

## 2015-05-07 DIAGNOSIS — M7750 Other enthesopathy of unspecified foot: Secondary | ICD-10-CM

## 2015-05-07 DIAGNOSIS — M779 Enthesopathy, unspecified: Secondary | ICD-10-CM

## 2015-05-07 DIAGNOSIS — M778 Other enthesopathies, not elsewhere classified: Secondary | ICD-10-CM

## 2015-05-07 DIAGNOSIS — M19079 Primary osteoarthritis, unspecified ankle and foot: Secondary | ICD-10-CM

## 2015-05-09 NOTE — Progress Notes (Signed)
She presents today for follow-up of her subtalar joint capsulitis. He states that the majority of the soreness has resolved now it only hurts in here as she points to the lateral gutter of the bilateral ankle.  Objective: She has good full range of motion of the ankle joint and pulses are palpable. He has pain on direct palpation of the lateral gutter remaining anterior to posterior.  Assessment: Capsulitis of the ankle degenerative joint disease and capsulitis of the subtalar joint.  Plan: Continue the use of the Voltaren gel and we injected dexamethasone today to the point of maximal tenderness bilateral ankle lateral gutter. I will follow-up with her in 1 month.

## 2015-06-15 ENCOUNTER — Ambulatory Visit (INDEPENDENT_AMBULATORY_CARE_PROVIDER_SITE_OTHER): Payer: Medicare Other | Admitting: Neurology

## 2015-06-15 ENCOUNTER — Encounter: Payer: Self-pay | Admitting: Neurology

## 2015-06-15 VITALS — BP 125/75 | HR 85 | Ht 63.5 in | Wt 172.5 lb

## 2015-06-15 DIAGNOSIS — E531 Pyridoxine deficiency: Secondary | ICD-10-CM

## 2015-06-15 DIAGNOSIS — R5383 Other fatigue: Secondary | ICD-10-CM | POA: Diagnosis not present

## 2015-06-15 DIAGNOSIS — R202 Paresthesia of skin: Secondary | ICD-10-CM

## 2015-06-15 DIAGNOSIS — E538 Deficiency of other specified B group vitamins: Secondary | ICD-10-CM

## 2015-06-15 DIAGNOSIS — K589 Irritable bowel syndrome without diarrhea: Secondary | ICD-10-CM | POA: Diagnosis not present

## 2015-06-15 DIAGNOSIS — K639 Disease of intestine, unspecified: Secondary | ICD-10-CM

## 2015-06-15 DIAGNOSIS — E519 Thiamine deficiency, unspecified: Secondary | ICD-10-CM | POA: Diagnosis not present

## 2015-06-15 DIAGNOSIS — G609 Hereditary and idiopathic neuropathy, unspecified: Secondary | ICD-10-CM | POA: Diagnosis not present

## 2015-06-15 DIAGNOSIS — L959 Vasculitis limited to the skin, unspecified: Secondary | ICD-10-CM | POA: Diagnosis not present

## 2015-06-15 MED ORDER — GABAPENTIN 100 MG PO CAPS: 300.0000 mg | ORAL_CAPSULE | Freq: Every day | ORAL | Status: DC

## 2015-06-15 NOTE — Patient Instructions (Signed)
Overall you are doing very well but I do want to suggest a few things today:   Remember to drink plenty of fluid, eat healthy meals and do not skip any meals. Try to eat protein with a every meal and eat a healthy snack such as fruit or nuts in between meals. Try to keep a regular sleep-wake schedule and try to exercise daily, particularly in the form of walking, 20-30 minutes a day, if you can.   As far as your medications are concerned, I would like to suggest: Neurontin as prescribed  As far as diagnostic testing: labs  I would like to see you back as needed, sooner if we need to. Please call us with any interim questions, concerns, problems, updates or refill requests.   Please also call us for any test results so we can go over those with you on the phone.  My clinical assistant and will answer any of your questions and relay your messages to me and also relay most of my messages to you.   Our phone number is [phone]. We also have an after hours call service for urgent matters and there is a physician on-call for urgent questions. For any emergencies you know to call 911 or go to the nearest emergency room

## 2015-06-15 NOTE — Progress Notes (Signed)
[facility]    Provider:  Dr Jaynee Eagles Referring Provider: Leamon Arnt, MD Primary Care Physician:  Leamon Arnt, MD  CC:  Peripheral neuropathy  HPI:  Carrie [name] is a 69 y.o. female here as a referral from Dr. Jonni Sanger for peripheral neuropathy. PMHx of Started in her left foot in 2010. Thought it was due to her LBP and she had surgery. Then she had symptoms in her right foot. Thought it was due to sciatica. Symptoms are tingling in the balls of her feet and some in the toes. The left is still a little worse but in the same areas. The tips of her fingers feels a little funny too. Sometimes as she is sitting in the evening they feel sensitive. She does modified triathlons and her hands get numb when she biking but otherwise no symptoms in the hands at night, no nocturnal awakenings. Her A1c is 6.2-6.4. She is already on Neurontin for symptoms. No falls. No balance problems. No cramping. She does very well. No other focal neurologic symptoms.   Review of Systems: Patient complains of symptoms per HPI as well as the following symptoms: palpitations, constipation, joint pain, feeling cold, numbness and tingling. Pertinent negatives per HPI. All others negative.   History   Social History  . Marital Status: Married    Spouse Name: N/A  . Number of Children: N/A  . Years of Education: N/A   Occupational History  . Not on file.   Social History Main Topics  . Smoking status: Former Smoker    Quit date: 02/02/2013  . Smokeless tobacco: Not on file  . Alcohol Use: Yes     Comment: 1 glass of wine per week  . Drug Use: No  . Sexual Activity: Not on file   Other Topics Concern  . Not on file   Social History Narrative   Lives in [place] with spouse.   Volunteers as a Contractor    Family History  Problem Relation Age of Onset  . Hypertension Mother   . Hypertension Sister   . Hypertension Sister   . Hypertension Sister   . Hypertension  Sister   . Cancer Sister     bladder  . Cancer Father     Past Medical History  Diagnosis Date  . GERD (gastroesophageal reflux disease)   . Colon polyps   . Seborrheic dermatitis of scalp 03/06/13  . Recurrent UTI   . IBS (irritable bowel syndrome)   . Vitamin B 12 deficiency   . Fatigue   . Esophageal spasm   . Frequent PVCs   . Palpitations     Past Surgical History  Procedure Laterality Date  . Colectomy Right 1998    large colonic polyp  . Rectocele repair    . Spine surgery      3 since 11/2010, laminoectomy, lumbar fusion, diskectomy    Current Outpatient Prescriptions  Medication Sig Dispense Refill  . calcium carbonate (TUMS CALCIUM FOR LIFE BONE) 750 MG chewable tablet Chew 1 tablet by mouth daily.     . Cholecalciferol (VITAMIN D) 2000 UNITS tablet Take 1 tablet by mouth daily.    . Ciclopirox 1 % shampoo Apply topically.    . clobetasol (TEMOVATE) 0.05 % external solution Apply one application to affected area as directed    . docusate sodium (COLACE) 100 MG capsule Take 100 mg by mouth daily.    . fexofenadine (ALLEGRA) 180 MG tablet Take 180 mg by mouth  as needed for allergies or rhinitis.     . fluticasone (FLONASE) 50 MCG/ACT nasal spray Place 2 sprays into the nose.    . hyoscyamine (LEVSIN/SL) 0.125 MG SL tablet Take 0.125 mg by mouth as needed.     . NORPACE CR 100 MG 12 hr capsule Take 1 capsule by mouth 2 (two) times daily.  1  . pantoprazole (PROTONIX) 20 MG tablet Take 20 mg by mouth daily.    . Probiotic Product (ACIDOPHILUS/GOAT MILK) CAPS Take 1 capsule by mouth daily.    . Ranitidine HCl (ZANTAC PO) Take 150 mg by mouth daily.     . sucralfate (CARAFATE) 1 G tablet as needed. Take 1 tablet (1 g total) by mouth daily as needed for stomach issues    . trimethoprim (TRIMPEX) 100 MG tablet as needed. One pill after sex may repeat in 12 hours and if symptoms are present may take one twice daily for three days    . diclofenac sodium (VOLTAREN) 1 % GEL  Apply 4 g topically 4 (four) times daily. (Patient not taking: Reported on 06/15/2015) 100 g 2  . fluticasone (FLONASE) 50 MCG/ACT nasal spray Place 2 sprays into both nostrils daily as needed for allergies or rhinitis.      No current facility-administered medications for this visit.    Allergies as of 06/15/2015 - Review Complete 06/15/2015  Allergen Reaction Noted  . Azithromycin Other (See Comments) 12/25/2013  . Epinephrine Other (See Comments) 12/25/2013  . Erythromycin Diarrhea and Nausea And Vomiting 12/25/2013  . Nitrofurantoin Nausea Only and Other (See Comments) 12/25/2013    Vitals: Ht 5' 3.5" (1.613 m)  Wt 172 lb 8 oz (78.245 kg)  BMI 30.07 kg/m2 Last Weight:  Wt Readings from Last 1 Encounters:  06/15/15 172 lb 8 oz (78.245 kg)   Last Height:   Ht Readings from Last 1 Encounters:  06/15/15 5' 3.5" (1.613 m)   Physical exam: Exam: Gen: NAD, conversant, well nourised, obese, well groomed                     CV: RRR, no MRG. No Carotid Bruits. No peripheral edema, warm, nontender Eyes: Conjunctivae clear without exudates or hemorrhage  Neuro: Detailed Neurologic Exam  Speech:    Speech is normal; fluent and spontaneous with normal comprehension.  Cognition:    The patient is oriented to person, place, and time;     recent and remote memory intact;     language fluent;     normal attention, concentration,     fund of knowledge Cranial Nerves:    The pupils are equal, round, and reactive to light. The fundi are normal and spontaneous venous pulsations are present. Visual fields are full to finger confrontation. Extraocular movements are intact. Trigeminal sensation is intact and the muscles of mastication are normal. The face is symmetric. The palate elevates in the midline. Hearing intact. Voice is normal. Shoulder shrug is normal. The tongue has normal motion without fasciculations.   Coordination:    Normal finger to nose and heel to shin. Normal rapid  alternating movements.   Gait:    Heel-toe and tandem gait are normal.   Motor Observation:    No asymmetry, no atrophy, and no involuntary movements noted. Tone:    Normal muscle tone.    Posture:    Posture is normal. normal erect    Strength:    Strength is V/V in the upper and lower limbs.  Sensation: intact to LT, intact to temp and pinprick, proprioception, some minimal decrease to vibration possibly.  Romberg negative.     Reflex Exam:  DTR's:    Deep tendon reflexes in the upper extremities are normal. Diminished in the AJs.   Toes:    The toes are downgoing bilaterally.   Clonus:    Clonus is absent.   Assessment/Plan:  Very nice 69 year old female with very mild distal peripheral neuropathy. Will order a complete serum neuropathy screen. Discussed neuropathy at length; diabetes is the most common cause and often if you do not have diabetes the cause is elusive in a majority of cases. Could do an emg/ncs but low likelihood this would give [place] any more info than we already have. Continue neurontin prn.   Sarina Ill, MD  [facility] [address] [place], [place] [address]  Phone [phone] Fax [phone]

## 2015-06-18 ENCOUNTER — Telehealth: Payer: Self-pay

## 2015-06-18 NOTE — Telephone Encounter (Signed)
-----   Message from Melvenia Beam, MD sent at 06/18/2015 12:12 PM EDT ----- Let patient know labs were all unremarkable thanks

## 2015-06-18 NOTE — Telephone Encounter (Signed)
I spoke to patient and she is aware and voices understanding. States that she will take the gabapentin and will call us for f/u appt.

## 2015-06-19 DIAGNOSIS — G609 Hereditary and idiopathic neuropathy, unspecified: Secondary | ICD-10-CM | POA: Insufficient documentation

## 2015-06-19 LAB — PARANEOPLASTIC PROFILE 1
Neuronal Nuc Ab (Ri), IFA: <1:10 {titer}
Neuronal Nuclear (Hu) Antibody (IB): <1:10 {titer}
Purkinje Cell (Yo) Autoantobodies- IFA: <1:10 {titer}

## 2015-06-19 LAB — THYROID PANEL WITH TSH
Free Thyroxine Index: 2 (ref 1.2–4.9)
T3 Uptake Ratio: 23 % — ABNORMAL LOW (ref 24–39)
T4, Total: 8.7 ug/dL (ref 4.5–12.0)
TSH: 1.37 u[IU]/mL (ref 0.450–4.500)

## 2015-06-19 LAB — MULTIPLE MYELOMA PANEL, SERUM
Albumin SerPl Elph-Mcnc: 3.7 g/dL (ref 2.9–4.4)
Albumin/Glob SerPl: 1.3 (ref 0.7–1.7)
Alpha 1: 0.3 g/dL (ref 0.0–0.4)
Alpha2 Glob SerPl Elph-Mcnc: 0.7 g/dL (ref 0.4–1.0)
B-Globulin SerPl Elph-Mcnc: 1.1 g/dL (ref 0.7–1.3)
Gamma Glob SerPl Elph-Mcnc: 0.8 g/dL (ref 0.4–1.8)
Globulin, Total: 3 g/dL (ref 2.2–3.9)
IgA/Immunoglobulin A, Serum: 115 mg/dL (ref 87–352)
IgG (Immunoglobin G), Serum: 965 mg/dL (ref 700–1600)
IgM (Immunoglobulin M), Srm: 124 mg/dL (ref 26–217)
Total Protein: 6.7 g/dL (ref 6.0–8.5)

## 2015-06-19 LAB — PAN-ANCA
ANCA Proteinase 3: <3.5 U/mL (ref 0.0–3.5)
Atypical pANCA: <1:20 {titer}
C-ANCA: <1:20 {titer}
Myeloperoxidase Ab: <9 U/mL (ref 0.0–9.0)
P-ANCA: <1:20 {titer}

## 2015-06-19 LAB — GLIADIN ANTIBODIES, SERUM
Antigliadin Abs, IgA: 6 units (ref 0–19)
Gliadin IgG: 1 units (ref 0–19)

## 2015-06-19 LAB — RPR: RPR Ser Ql: NONREACTIVE

## 2015-06-19 LAB — VITAMIN B6: Vitamin B6: 7.1 ug/L (ref 2.0–32.8)

## 2015-06-19 LAB — ANA W/REFLEX: Anti Nuclear Antibody(ANA): NEGATIVE

## 2015-06-19 LAB — CRYOGLOBULIN

## 2015-06-19 LAB — METHYLMALONIC ACID, SERUM: Methylmalonic Acid: 239 nmol/L (ref 0–378)

## 2015-06-19 LAB — HEPATITIS C ANTIBODY: Hep C Virus Ab: <0.1 s/co ratio (ref 0.0–0.9)

## 2015-06-19 LAB — ANGIOTENSIN CONVERTING ENZYME: Angio Convert Enzyme: 40 U/L (ref 14–82)

## 2015-06-19 LAB — TISSUE TRANSGLUTAMINASE, IGA: Transglutaminase IgA: 2 U/mL (ref 0–3)

## 2015-06-19 LAB — SEDIMENTATION RATE: Sed Rate: 9 mm/hr (ref 0–40)

## 2015-06-19 LAB — VITAMIN E: Vitamin E (Alpha Tocopherol): 14.7 mg/L (ref 6.5–21.5)

## 2015-06-19 LAB — B. BURGDORFI ANTIBODIES: Lyme IgG/IgM Ab: <0.91 {ISR} (ref 0.00–0.90)

## 2015-06-19 LAB — HEAVY METALS, BLOOD
Arsenic: 11 ug/L (ref 2–23)
Lead, Blood: NOT DETECTED ug/dL (ref 0–19)
Mercury: 4.7 ug/L (ref 0.0–14.9)

## 2015-06-19 LAB — B12 AND FOLATE PANEL
Folate: 19.7 ng/mL (ref >3.0)
Vitamin B-12: 492 pg/mL (ref 211–946)

## 2015-06-19 LAB — HIV ANTIBODY (ROUTINE TESTING W REFLEX): HIV Screen 4th Generation wRfx: NONREACTIVE

## 2015-06-19 LAB — RHEUMATOID FACTOR: Rhuematoid fact SerPl-aCnc: 14.1 IU/mL — ABNORMAL HIGH (ref 0.0–13.9)

## 2015-06-19 LAB — VITAMIN B1: Thiamine: 127.7 nmol/L (ref 66.5–200.0)

## 2015-06-22 ENCOUNTER — Telehealth: Payer: Self-pay | Admitting: *Deleted

## 2015-06-22 NOTE — Telephone Encounter (Signed)
-----   Message from Melvenia Beam, MD sent at 06/21/2015 12:01 PM EDT ----- Please let patient know her labs were unremarkable thanks

## 2015-06-22 NOTE — Telephone Encounter (Signed)
I have spoken with Carrie [name] this afternoon and per AA, advised that labs were ok.  She verbalized understanding of same/fim

## 2015-06-25 ENCOUNTER — Ambulatory Visit: Payer: Medicare Other | Admitting: Podiatry

## 2015-06-25 LAB — HM COLONOSCOPY

## 2015-09-22 ENCOUNTER — Other Ambulatory Visit: Payer: Self-pay

## 2015-09-22 DIAGNOSIS — Z1231 Encounter for screening mammogram for malignant neoplasm of breast: Secondary | ICD-10-CM

## 2015-10-26 ENCOUNTER — Ambulatory Visit
Admission: RE | Admit: 2015-10-26 | Discharge: 2015-10-26 | Disposition: A | Discharge status: Home / Self Care | Payer: Medicare Other | Source: Ambulatory Visit

## 2015-10-26 DIAGNOSIS — Z1231 Encounter for screening mammogram for malignant neoplasm of breast: Secondary | ICD-10-CM

## 2015-11-24 ENCOUNTER — Ambulatory Visit (INDEPENDENT_AMBULATORY_CARE_PROVIDER_SITE_OTHER): Payer: Medicare Other

## 2015-11-24 ENCOUNTER — Encounter: Payer: Self-pay | Admitting: Podiatry

## 2015-11-24 ENCOUNTER — Ambulatory Visit (INDEPENDENT_AMBULATORY_CARE_PROVIDER_SITE_OTHER): Payer: Medicare Other | Admitting: Podiatry

## 2015-11-24 VITALS — BP 149/83 | HR 87 | Resp 16

## 2015-11-24 DIAGNOSIS — K635 Polyp of colon: Secondary | ICD-10-CM | POA: Insufficient documentation

## 2015-11-24 DIAGNOSIS — M7662 Achilles tendinitis, left leg: Secondary | ICD-10-CM | POA: Diagnosis not present

## 2015-11-24 DIAGNOSIS — M4726 Other spondylosis with radiculopathy, lumbar region: Secondary | ICD-10-CM | POA: Insufficient documentation

## 2015-11-24 DIAGNOSIS — K219 Gastro-esophageal reflux disease without esophagitis: Secondary | ICD-10-CM | POA: Insufficient documentation

## 2015-11-24 NOTE — Progress Notes (Signed)
She presents today with a new problem pain to the posterior aspect of the left heel since November. She states that she is very active and has a hard time wearing shoe gear with posterior heel counter.   Objective: Vital signs are stable she is alert and oriented 3. Pulses are strongly palpable. Neurologic sensorium is intact per Semmes-Weinstein monofilament. Degenerative reflexes are intact bilaterally muscle strength +5 over 5 dorsiflexors plantar flexors and inverters everters all intrinsic musculature is intact. She has tenderness on palpation of the Achilles as it inserts Merrill in the distal most aspect of the calcaneus plantarly as well as medially. There does not appear to be a defect in the tendon itself and radiographically there is no large spur and some thickening of the Achilles is  Present.   Assessment : insertional Achilles tendinitis left.  Plan: due to her PVCs I am not placing her on any anti-inflammatories or steroids. I will however provide her with a dexamethasone injection to the point of maximal tenderness making sure not to inject into the tendon itself. I also started her on diclofenac gel and placed her in a plantar fascial night splint for nighttime use and a short Cam Walker for daytime use. I also  Recommended rest ice and elevation. Follow-up with her 1 month

## 2015-11-26 ENCOUNTER — Telehealth: Payer: Self-pay | Admitting: Internal Medicine

## 2015-11-26 NOTE — Telephone Encounter (Signed)
New Message   Pt wants to tell Dr.Allred that she wants to go to 150mg  or higher on Nortace CR

## 2015-11-26 NOTE — Telephone Encounter (Signed)
Patient stated there is a shortage of Norpace 100 mg and her pharmacist has Norpace 150 mg available. She would like an order for Norpace 150 mg twice daily. Patient stated she has more frequent PVC's. Will forward to Dr. Rayann Heman and his nurse Claiborne Billings.

## 2015-12-01 MED ORDER — NORPACE CR 150 MG PO CP12: 150.0000 mg | ORAL_CAPSULE | Freq: Two times a day (BID) | ORAL | Status: DC

## 2015-12-22 ENCOUNTER — Ambulatory Visit (INDEPENDENT_AMBULATORY_CARE_PROVIDER_SITE_OTHER): Payer: Medicare Other | Admitting: Podiatry

## 2015-12-22 ENCOUNTER — Telehealth: Payer: Self-pay | Admitting: *Deleted

## 2015-12-22 ENCOUNTER — Encounter: Payer: Self-pay | Admitting: Podiatry

## 2015-12-22 VITALS — BP 162/78 | HR 76 | Resp 16

## 2015-12-22 DIAGNOSIS — M7662 Achilles tendinitis, left leg: Secondary | ICD-10-CM

## 2015-12-22 NOTE — Telephone Encounter (Signed)
Entered in error

## 2015-12-22 NOTE — Progress Notes (Signed)
At this point she presents today for a follow-up of her Achilles tendinitis right foot. She states that it is approximately 50% improved. Currently she states that she really doesn't want to wear the boot any longer and would like to consider physical therapy.  Objective: Vital signs are stable she is alert and oriented 3. Pulses are palpable. She still has significant pain on palpation of the Achilles tendon as it inserts on the calcaneus. Still mild hyperpigmentation of the area.  Assessment: Chronic intractable Achilles tendinitis right.  Plan: Encouraged her to wear the Cam Walker until told to do otherwise by physical therapy. At this point we are going to send her to [facility] in [place]. I will follow-up with her in the near future.

## 2015-12-22 NOTE — Telephone Encounter (Signed)
PT referral faxed to [facility].

## 2015-12-23 DIAGNOSIS — M25572 Pain in left ankle and joints of left foot: Secondary | ICD-10-CM | POA: Diagnosis not present

## 2015-12-23 DIAGNOSIS — M7662 Achilles tendinitis, left leg: Secondary | ICD-10-CM | POA: Diagnosis not present

## 2015-12-24 ENCOUNTER — Telehealth: Payer: Self-pay | Admitting: *Deleted

## 2015-12-24 NOTE — Telephone Encounter (Signed)
Carrie [name] - PT states needs rx ordering Iontophoresis for pt to continue therapy.  Faxed rx to [phone].

## 2015-12-28 DIAGNOSIS — M25572 Pain in left ankle and joints of left foot: Secondary | ICD-10-CM | POA: Diagnosis not present

## 2015-12-28 DIAGNOSIS — M7662 Achilles tendinitis, left leg: Secondary | ICD-10-CM | POA: Diagnosis not present

## 2015-12-30 DIAGNOSIS — M25572 Pain in left ankle and joints of left foot: Secondary | ICD-10-CM | POA: Diagnosis not present

## 2015-12-30 DIAGNOSIS — M7662 Achilles tendinitis, left leg: Secondary | ICD-10-CM | POA: Diagnosis not present

## 2016-01-01 DIAGNOSIS — M25572 Pain in left ankle and joints of left foot: Secondary | ICD-10-CM | POA: Diagnosis not present

## 2016-01-01 DIAGNOSIS — M7662 Achilles tendinitis, left leg: Secondary | ICD-10-CM | POA: Diagnosis not present

## 2016-01-04 DIAGNOSIS — M7662 Achilles tendinitis, left leg: Secondary | ICD-10-CM | POA: Diagnosis not present

## 2016-01-04 DIAGNOSIS — M25572 Pain in left ankle and joints of left foot: Secondary | ICD-10-CM | POA: Diagnosis not present

## 2016-01-06 DIAGNOSIS — M7662 Achilles tendinitis, left leg: Secondary | ICD-10-CM | POA: Diagnosis not present

## 2016-01-06 DIAGNOSIS — M25572 Pain in left ankle and joints of left foot: Secondary | ICD-10-CM | POA: Diagnosis not present

## 2016-01-08 DIAGNOSIS — M7662 Achilles tendinitis, left leg: Secondary | ICD-10-CM | POA: Diagnosis not present

## 2016-01-08 DIAGNOSIS — M25572 Pain in left ankle and joints of left foot: Secondary | ICD-10-CM | POA: Diagnosis not present

## 2016-01-11 DIAGNOSIS — M7662 Achilles tendinitis, left leg: Secondary | ICD-10-CM | POA: Diagnosis not present

## 2016-01-11 DIAGNOSIS — M25572 Pain in left ankle and joints of left foot: Secondary | ICD-10-CM | POA: Diagnosis not present

## 2016-01-12 DIAGNOSIS — D485 Neoplasm of uncertain behavior of skin: Secondary | ICD-10-CM | POA: Diagnosis not present

## 2016-01-12 DIAGNOSIS — I8312 Varicose veins of left lower extremity with inflammation: Secondary | ICD-10-CM | POA: Diagnosis not present

## 2016-01-12 DIAGNOSIS — L821 Other seborrheic keratosis: Secondary | ICD-10-CM | POA: Diagnosis not present

## 2016-01-12 DIAGNOSIS — C44319 Basal cell carcinoma of skin of other parts of face: Secondary | ICD-10-CM | POA: Diagnosis not present

## 2016-01-12 DIAGNOSIS — I8311 Varicose veins of right lower extremity with inflammation: Secondary | ICD-10-CM | POA: Diagnosis not present

## 2016-01-12 DIAGNOSIS — I872 Venous insufficiency (chronic) (peripheral): Secondary | ICD-10-CM | POA: Diagnosis not present

## 2016-01-13 DIAGNOSIS — M7662 Achilles tendinitis, left leg: Secondary | ICD-10-CM | POA: Diagnosis not present

## 2016-01-13 DIAGNOSIS — M25572 Pain in left ankle and joints of left foot: Secondary | ICD-10-CM | POA: Diagnosis not present

## 2016-01-14 DIAGNOSIS — M5136 Other intervertebral disc degeneration, lumbar region: Secondary | ICD-10-CM | POA: Diagnosis not present

## 2016-01-14 DIAGNOSIS — M5137 Other intervertebral disc degeneration, lumbosacral region: Secondary | ICD-10-CM | POA: Diagnosis not present

## 2016-01-14 DIAGNOSIS — M8588 Other specified disorders of bone density and structure, other site: Secondary | ICD-10-CM | POA: Diagnosis not present

## 2016-01-14 DIAGNOSIS — Z981 Arthrodesis status: Secondary | ICD-10-CM | POA: Diagnosis not present

## 2016-01-14 DIAGNOSIS — M5442 Lumbago with sciatica, left side: Secondary | ICD-10-CM | POA: Diagnosis not present

## 2016-01-15 DIAGNOSIS — M25572 Pain in left ankle and joints of left foot: Secondary | ICD-10-CM | POA: Diagnosis not present

## 2016-01-15 DIAGNOSIS — M7662 Achilles tendinitis, left leg: Secondary | ICD-10-CM | POA: Diagnosis not present

## 2016-01-18 DIAGNOSIS — M7662 Achilles tendinitis, left leg: Secondary | ICD-10-CM | POA: Diagnosis not present

## 2016-01-18 DIAGNOSIS — M25572 Pain in left ankle and joints of left foot: Secondary | ICD-10-CM | POA: Diagnosis not present

## 2016-01-19 ENCOUNTER — Ambulatory Visit (INDEPENDENT_AMBULATORY_CARE_PROVIDER_SITE_OTHER): Payer: Medicare Other | Admitting: Podiatry

## 2016-01-19 ENCOUNTER — Encounter: Payer: Self-pay | Admitting: Podiatry

## 2016-01-19 VITALS — BP 129/74 | HR 93 | Resp 16

## 2016-01-19 DIAGNOSIS — M6281 Muscle weakness (generalized): Secondary | ICD-10-CM | POA: Diagnosis not present

## 2016-01-19 DIAGNOSIS — M7662 Achilles tendinitis, left leg: Secondary | ICD-10-CM | POA: Diagnosis not present

## 2016-01-19 DIAGNOSIS — M545 Low back pain: Secondary | ICD-10-CM | POA: Diagnosis not present

## 2016-01-19 DIAGNOSIS — M5416 Radiculopathy, lumbar region: Secondary | ICD-10-CM | POA: Diagnosis not present

## 2016-01-19 NOTE — Progress Notes (Signed)
She presents today for follow-up of her Achilles tendinitis of her left heel. She is finishing up physical therapy and feels that she is approximately 70% improved. She continues to utilize the KT tape and good shoe gear.  Objective: Vital signs are stable she is alert and oriented 3 she has pinpoint tenderness on palpation of the insertion site of the left Achilles tendon. There is no warmth on palpation.  Assessment: Insertional Achilles tendinitis left heel.  Plan: Continue physical therapy exercises demonstrated by physical therapy department. I also recommended an injection to the bursa which she declined and I also recommended a nitroglycerin patch 0.2 mg over 12 hours which she declined. I expressed to her that I will follow-up with her on an as-needed basis but she can should consider continuing all conservative therapies.

## 2016-01-25 DIAGNOSIS — M6281 Muscle weakness (generalized): Secondary | ICD-10-CM | POA: Diagnosis not present

## 2016-01-25 DIAGNOSIS — M5416 Radiculopathy, lumbar region: Secondary | ICD-10-CM | POA: Diagnosis not present

## 2016-01-25 DIAGNOSIS — M545 Low back pain: Secondary | ICD-10-CM | POA: Diagnosis not present

## 2016-01-26 ENCOUNTER — Telehealth: Payer: Self-pay | Admitting: Internal Medicine

## 2016-01-26 DIAGNOSIS — M5416 Radiculopathy, lumbar region: Secondary | ICD-10-CM | POA: Diagnosis not present

## 2016-01-26 DIAGNOSIS — M545 Low back pain: Secondary | ICD-10-CM | POA: Diagnosis not present

## 2016-01-26 DIAGNOSIS — M6281 Muscle weakness (generalized): Secondary | ICD-10-CM | POA: Diagnosis not present

## 2016-01-26 MED ORDER — NORPACE CR 150 MG PO CP12: 150.0000 mg | ORAL_CAPSULE | Freq: Two times a day (BID) | ORAL | Status: DC

## 2016-01-26 NOTE — Telephone Encounter (Signed)
New message      Talk to the nurse about some problems with her norpace 150mg .  That is all she would tell me

## 2016-01-26 NOTE — Telephone Encounter (Signed)
Returned call back to patient and she has spoken with pharmacy and will approve 90 day supply.  Sent in.  Patient aware

## 2016-01-27 ENCOUNTER — Telehealth: Payer: Self-pay | Admitting: Internal Medicine

## 2016-01-27 ENCOUNTER — Encounter: Payer: Self-pay | Admitting: *Deleted

## 2016-01-27 MED ORDER — NORPACE CR 150 MG PO CP12: 150.0000 mg | ORAL_CAPSULE | Freq: Two times a day (BID) | ORAL | Status: DC

## 2016-01-27 NOTE — Telephone Encounter (Signed)
New message      Calling to let Carrie [name] know she got 30day supply on her presc of norpace CR yesterday instead of 90 day. She thought she was getting 73 day suppley.  Please call

## 2016-01-27 NOTE — Telephone Encounter (Signed)
Spoke with the patient and she is taking brand Norpace.  The previous letter will not work.  It has something to do with it being drop shipped from [place] straight to patient's home and the date the medication expires.

## 2016-01-27 NOTE — Telephone Encounter (Signed)
Spoke with patient and this was ordered yesterday and reordered today.  She will call back if there is a problem

## 2016-01-27 NOTE — Telephone Encounter (Signed)
Follow up      Please call [email or web address]) to get info for letter of necessity regarding norpace.  The note we faxed did not contain enough information

## 2016-01-27 NOTE — Telephone Encounter (Signed)
She needs a letter of necessity for her NorPace CR 150 mg.

## 2016-01-27 NOTE — Telephone Encounter (Signed)
Follow up Please call [facility] [email or web address]) to get info for letter of necessity regarding norpace.  The note we faxed did not contain enough information

## 2016-01-28 ENCOUNTER — Encounter: Payer: Self-pay | Admitting: Pharmacist

## 2016-01-28 DIAGNOSIS — Z8739 Personal history of other diseases of the musculoskeletal system and connective tissue: Secondary | ICD-10-CM | POA: Diagnosis not present

## 2016-01-28 DIAGNOSIS — M5442 Lumbago with sciatica, left side: Secondary | ICD-10-CM | POA: Diagnosis not present

## 2016-01-28 DIAGNOSIS — Z981 Arthrodesis status: Secondary | ICD-10-CM | POA: Diagnosis not present

## 2016-01-28 DIAGNOSIS — C44310 Basal cell carcinoma of skin of unspecified parts of face: Secondary | ICD-10-CM | POA: Diagnosis not present

## 2016-01-28 NOTE — Telephone Encounter (Signed)
Spoke with Santiago Glad with [employer].  Informed that letter of medical necessity was no longer required.  [facility] would just need to request drop shipment from [place].  Spoke with [place] with [employer] again.  She stated [employer] gave her the wrong information yesterday and so she has already requested the drop shipment for this week. LMOM for pt to inform them everything should be taken care of now.

## 2016-01-28 NOTE — Telephone Encounter (Signed)
Spoke with [facility] with [employer].  A letter of medical necessity was sent to [place] at [phone] including pharmacy information and wholesaler.

## 2016-01-28 NOTE — Telephone Encounter (Signed)
Follow up      Calling to let you know that [employer] does not need a letter of necessity for the norpace.  All we need to do is call the local pharmacy and tell them you need a "drop ship" order with the whole seller.  It is a limit of 3 per customer per week.  The local pharmacy should be able to help you.  Call her back if you need her to walk you thru it.

## 2016-02-02 DIAGNOSIS — M545 Low back pain: Secondary | ICD-10-CM | POA: Diagnosis not present

## 2016-02-02 DIAGNOSIS — M5416 Radiculopathy, lumbar region: Secondary | ICD-10-CM | POA: Diagnosis not present

## 2016-02-02 DIAGNOSIS — M6281 Muscle weakness (generalized): Secondary | ICD-10-CM | POA: Diagnosis not present

## 2016-02-04 DIAGNOSIS — M5416 Radiculopathy, lumbar region: Secondary | ICD-10-CM | POA: Diagnosis not present

## 2016-02-04 DIAGNOSIS — M545 Low back pain: Secondary | ICD-10-CM | POA: Diagnosis not present

## 2016-02-04 DIAGNOSIS — M6281 Muscle weakness (generalized): Secondary | ICD-10-CM | POA: Diagnosis not present

## 2016-02-08 DIAGNOSIS — M545 Low back pain: Secondary | ICD-10-CM | POA: Diagnosis not present

## 2016-02-08 DIAGNOSIS — M6281 Muscle weakness (generalized): Secondary | ICD-10-CM | POA: Diagnosis not present

## 2016-02-08 DIAGNOSIS — M5416 Radiculopathy, lumbar region: Secondary | ICD-10-CM | POA: Diagnosis not present

## 2016-02-11 DIAGNOSIS — M545 Low back pain: Secondary | ICD-10-CM | POA: Diagnosis not present

## 2016-02-11 DIAGNOSIS — M5416 Radiculopathy, lumbar region: Secondary | ICD-10-CM | POA: Diagnosis not present

## 2016-02-11 DIAGNOSIS — M6281 Muscle weakness (generalized): Secondary | ICD-10-CM | POA: Diagnosis not present

## 2016-02-18 ENCOUNTER — Ambulatory Visit: Payer: Medicare Other | Admitting: Internal Medicine

## 2016-02-23 DIAGNOSIS — M5416 Radiculopathy, lumbar region: Secondary | ICD-10-CM | POA: Diagnosis not present

## 2016-02-23 DIAGNOSIS — M6281 Muscle weakness (generalized): Secondary | ICD-10-CM | POA: Diagnosis not present

## 2016-02-23 DIAGNOSIS — M545 Low back pain: Secondary | ICD-10-CM | POA: Diagnosis not present

## 2016-02-25 DIAGNOSIS — M545 Low back pain: Secondary | ICD-10-CM | POA: Diagnosis not present

## 2016-02-25 DIAGNOSIS — M6281 Muscle weakness (generalized): Secondary | ICD-10-CM | POA: Diagnosis not present

## 2016-02-25 DIAGNOSIS — M5416 Radiculopathy, lumbar region: Secondary | ICD-10-CM | POA: Diagnosis not present

## 2016-03-01 DIAGNOSIS — Z85828 Personal history of other malignant neoplasm of skin: Secondary | ICD-10-CM | POA: Diagnosis not present

## 2016-03-01 DIAGNOSIS — C44319 Basal cell carcinoma of skin of other parts of face: Secondary | ICD-10-CM | POA: Diagnosis not present

## 2016-03-02 ENCOUNTER — Encounter: Payer: Self-pay | Admitting: Internal Medicine

## 2016-03-02 ENCOUNTER — Ambulatory Visit (INDEPENDENT_AMBULATORY_CARE_PROVIDER_SITE_OTHER): Payer: Medicare Other | Admitting: Internal Medicine

## 2016-03-02 VITALS — BP 146/80 | HR 83 | Ht 63.5 in | Wt 172.2 lb

## 2016-03-02 DIAGNOSIS — M545 Low back pain: Secondary | ICD-10-CM | POA: Diagnosis not present

## 2016-03-02 DIAGNOSIS — I493 Ventricular premature depolarization: Secondary | ICD-10-CM

## 2016-03-02 DIAGNOSIS — M6281 Muscle weakness (generalized): Secondary | ICD-10-CM | POA: Diagnosis not present

## 2016-03-02 DIAGNOSIS — I10 Essential (primary) hypertension: Secondary | ICD-10-CM | POA: Diagnosis not present

## 2016-03-02 DIAGNOSIS — R002 Palpitations: Secondary | ICD-10-CM

## 2016-03-02 DIAGNOSIS — M5416 Radiculopathy, lumbar region: Secondary | ICD-10-CM | POA: Diagnosis not present

## 2016-03-02 NOTE — Patient Instructions (Signed)

## 2016-03-02 NOTE — Progress Notes (Signed)
Electrophysiology Office Note   Date:  03/02/2016   ID:  ROSSE KNIPE, DOB October 18, 1946, MRN [id number]  PCP:  Leamon Arnt, MD   Primary Electrophysiologist: Thompson Grayer, MD    Chief Complaint  Patient presents with  . Palpitations     History of Present Illness: Carrie [name] is a 70 y.o. female who presents today for electrophysiology evaluation.   She has done well over the past year.  Her PVCs are well controlled with norpace.  Her BP is stable.  She has rare palpitations.  She has chronic back pain which limits activity.  Today, she denies symptoms of chest pain, shortness of breath, orthopnea, PND, lower extremity edema, claudication, dizziness, presyncope, syncope, bleeding, or neurologic sequela. The patient is tolerating medications without difficulties and is otherwise without complaint today.    Past Medical History  Diagnosis Date  . GERD (gastroesophageal reflux disease)   . Colon polyps   . Seborrheic dermatitis of scalp 03/06/13  . Recurrent UTI   . IBS (irritable bowel syndrome)   . Vitamin B 12 deficiency   . Fatigue   . Esophageal spasm   . Frequent PVCs   . Palpitations    Past Surgical History  Procedure Laterality Date  . Colectomy Right 1998    large colonic polyp  . Rectocele repair  2001  . Spine surgery      3 since 11/2010, laminoectomy, lumbar fusion, diskectomy  . Endoscopic vein laser treatment Bilateral 2010-2011     Current Outpatient Prescriptions  Medication Sig Dispense Refill  . betamethasone dipropionate 0.05 % lotion daily.    . calcium carbonate (TUMS CALCIUM FOR LIFE BONE) 750 MG chewable tablet Chew 1 tablet by mouth daily.     . Cholecalciferol (VITAMIN D) 2000 UNITS tablet Take 1 tablet by mouth daily.    . Ciclopirox 1 % shampoo Apply topically.    . diclofenac sodium (VOLTAREN) 1 % GEL Apply 2 g topically as directed.    . doxycycline (VIBRAMYCIN) 100 MG capsule Take 1 capsule by mouth 2 (two) times daily.    .  fexofenadine (ALLEGRA) 180 MG tablet Take 180 mg by mouth as needed for allergies or rhinitis.     . fluticasone (FLONASE) 50 MCG/ACT nasal spray Place 2 sprays into the nose.    Marland Kitchen FLUZONE HIGH-DOSE 0.5 ML SUSY inject 0.5 milliliter intramuscularly  0  . HYDROcodone-acetaminophen (NORCO/VICODIN) 5-325 MG tablet Take 1 tablet by mouth as directed. Pt take a half tablet as needed for pain    . hyoscyamine (LEVSIN/SL) 0.125 MG SL tablet Take 0.125 mg by mouth as needed.     . NORPACE CR 150 MG 12 hr capsule Take 1 capsule (150 mg total) by mouth 2 (two) times daily. 180 capsule 3  . Probiotic Product (ACIDOPHILUS/GOAT MILK) CAPS Take 1 capsule by mouth daily.    . ranitidine (ZANTAC) 150 MG tablet Take 150 mg by mouth 2 (two) times daily.  0  . trimethoprim (TRIMPEX) 100 MG tablet as needed. One pill after sex may repeat in 12 hours and if symptoms are present may take one twice daily for three days     No current facility-administered medications for this visit.    Allergies:   Azithromycin; Epinephrine; Erythromycin; and Nitrofurantoin   Social History:  The patient  reports that she quit smoking about 3 years ago. She does not have any smokeless tobacco history on file. She reports that she drinks alcohol. She reports that  she does not use illicit drugs.   Family History:  The patient's  family history includes Cancer in her father and sister; Hypertension in her mother, sister, sister, sister, and sister.    ROS:  Please see the history of present illness.   All other systems are reviewed and negative.    PHYSICAL EXAM: VS:  BP 146/80 mmHg  Pulse 83  Ht 5' 3.5" (1.613 m)  Wt 172 lb 3.2 oz (78.109 kg)  BMI 30.02 kg/m2 , BMI Body mass index is 30.02 kg/(m^2). GEN: Well nourished, well developed, in no acute distress HEENT: normal Neck: no JVD, carotid bruits, or masses Cardiac: RRR; no murmurs, rubs, or gallops,no edema  Respiratory:  clear to auscultation bilaterally, normal work of  breathing GI: soft, nontender, nondistended, + BS MS: no deformity or atrophy Skin: warm and dry  Neuro:  Strength and sensation are intact Psych: euthymic mood, full affect  EKG:  EKG is ordered today. The ekg ordered today shows sinus rhythm, normal ekg    Wt Readings from Last 3 Encounters:  03/02/16 172 lb 3.2 oz (78.109 kg)  06/15/15 172 lb 8 oz (78.245 kg)  03/19/15 165 lb (74.844 kg)    Other studies Reviewed: Additional studies/ records that were reviewed today include: echo from 2/15 is reviewed with patient again today  ASSESSMENT AND PLAN:  1. Palpitations/ pvcs Well controlled with norpace CR 150mg  daily (rarely takes bid).  No further workup planned at this time  2. htn Repeat by me is 130/82, reports good control at home No change required today   Return to see me in 1 year    Current medicines are reviewed at length with the patient today.   The patient does not have concerns regarding her medicines.  The following changes were made today:  none   Signed, Thompson Grayer, MD  03/02/2016 1:09 PM     [place] [address] [phone] (office) [phone] (fax)

## 2016-03-04 DIAGNOSIS — M5416 Radiculopathy, lumbar region: Secondary | ICD-10-CM | POA: Diagnosis not present

## 2016-03-04 DIAGNOSIS — M545 Low back pain: Secondary | ICD-10-CM | POA: Diagnosis not present

## 2016-03-04 DIAGNOSIS — M6281 Muscle weakness (generalized): Secondary | ICD-10-CM | POA: Diagnosis not present

## 2016-03-08 DIAGNOSIS — M4316 Spondylolisthesis, lumbar region: Secondary | ICD-10-CM | POA: Diagnosis not present

## 2016-03-08 DIAGNOSIS — G629 Polyneuropathy, unspecified: Secondary | ICD-10-CM | POA: Diagnosis not present

## 2016-03-08 DIAGNOSIS — M6281 Muscle weakness (generalized): Secondary | ICD-10-CM | POA: Diagnosis not present

## 2016-03-08 DIAGNOSIS — M5136 Other intervertebral disc degeneration, lumbar region: Secondary | ICD-10-CM | POA: Diagnosis not present

## 2016-03-08 DIAGNOSIS — M5416 Radiculopathy, lumbar region: Secondary | ICD-10-CM | POA: Diagnosis not present

## 2016-03-08 DIAGNOSIS — M545 Low back pain: Secondary | ICD-10-CM | POA: Diagnosis not present

## 2016-03-08 DIAGNOSIS — M4726 Other spondylosis with radiculopathy, lumbar region: Secondary | ICD-10-CM | POA: Diagnosis not present

## 2016-03-10 DIAGNOSIS — M6281 Muscle weakness (generalized): Secondary | ICD-10-CM | POA: Diagnosis not present

## 2016-03-10 DIAGNOSIS — M5416 Radiculopathy, lumbar region: Secondary | ICD-10-CM | POA: Diagnosis not present

## 2016-03-10 DIAGNOSIS — M4806 Spinal stenosis, lumbar region: Secondary | ICD-10-CM | POA: Diagnosis not present

## 2016-03-10 DIAGNOSIS — M4726 Other spondylosis with radiculopathy, lumbar region: Secondary | ICD-10-CM | POA: Diagnosis not present

## 2016-03-10 DIAGNOSIS — M545 Low back pain: Secondary | ICD-10-CM | POA: Diagnosis not present

## 2016-03-14 DIAGNOSIS — M5416 Radiculopathy, lumbar region: Secondary | ICD-10-CM | POA: Diagnosis not present

## 2016-03-21 ENCOUNTER — Telehealth: Payer: Self-pay | Admitting: *Deleted

## 2016-03-21 MED ORDER — DICLOFENAC SODIUM 1 % TD GEL: 2.0000 g | TRANSDERMAL | Status: DC

## 2016-03-21 NOTE — Telephone Encounter (Signed)
Refill request for Diclofenac gel.  Dr. Milinda Pointer states refill for 1 year.  Done.

## 2016-03-23 DIAGNOSIS — M5136 Other intervertebral disc degeneration, lumbar region: Secondary | ICD-10-CM | POA: Diagnosis not present

## 2016-03-23 DIAGNOSIS — M5440 Lumbago with sciatica, unspecified side: Secondary | ICD-10-CM | POA: Diagnosis not present

## 2016-03-23 DIAGNOSIS — M4726 Other spondylosis with radiculopathy, lumbar region: Secondary | ICD-10-CM | POA: Diagnosis not present

## 2016-03-29 DIAGNOSIS — G245 Blepharospasm: Secondary | ICD-10-CM | POA: Diagnosis not present

## 2016-03-29 DIAGNOSIS — H2513 Age-related nuclear cataract, bilateral: Secondary | ICD-10-CM | POA: Diagnosis not present

## 2016-03-29 DIAGNOSIS — H1851 Endothelial corneal dystrophy: Secondary | ICD-10-CM | POA: Diagnosis not present

## 2016-03-29 DIAGNOSIS — H04123 Dry eye syndrome of bilateral lacrimal glands: Secondary | ICD-10-CM | POA: Diagnosis not present

## 2016-04-08 DIAGNOSIS — I493 Ventricular premature depolarization: Secondary | ICD-10-CM | POA: Diagnosis not present

## 2016-04-08 DIAGNOSIS — G609 Hereditary and idiopathic neuropathy, unspecified: Secondary | ICD-10-CM | POA: Insufficient documentation

## 2016-04-08 DIAGNOSIS — M8580 Other specified disorders of bone density and structure, unspecified site: Secondary | ICD-10-CM | POA: Diagnosis not present

## 2016-04-08 DIAGNOSIS — M5442 Lumbago with sciatica, left side: Secondary | ICD-10-CM | POA: Diagnosis not present

## 2016-04-08 DIAGNOSIS — K219 Gastro-esophageal reflux disease without esophagitis: Secondary | ICD-10-CM | POA: Diagnosis not present

## 2016-04-08 DIAGNOSIS — K580 Irritable bowel syndrome with diarrhea: Secondary | ICD-10-CM | POA: Diagnosis not present

## 2016-04-08 DIAGNOSIS — N390 Urinary tract infection, site not specified: Secondary | ICD-10-CM | POA: Diagnosis not present

## 2016-04-08 DIAGNOSIS — E7800 Pure hypercholesterolemia, unspecified: Secondary | ICD-10-CM | POA: Diagnosis not present

## 2016-04-12 ENCOUNTER — Encounter: Payer: Self-pay | Admitting: Internal Medicine

## 2016-04-14 DIAGNOSIS — M4806 Spinal stenosis, lumbar region: Secondary | ICD-10-CM | POA: Diagnosis not present

## 2016-04-14 DIAGNOSIS — M5136 Other intervertebral disc degeneration, lumbar region: Secondary | ICD-10-CM | POA: Diagnosis not present

## 2016-04-14 DIAGNOSIS — M4726 Other spondylosis with radiculopathy, lumbar region: Secondary | ICD-10-CM | POA: Diagnosis not present

## 2016-05-16 DIAGNOSIS — R600 Localized edema: Secondary | ICD-10-CM | POA: Diagnosis not present

## 2016-05-16 DIAGNOSIS — R252 Cramp and spasm: Secondary | ICD-10-CM | POA: Diagnosis not present

## 2016-05-24 DIAGNOSIS — M4726 Other spondylosis with radiculopathy, lumbar region: Secondary | ICD-10-CM | POA: Diagnosis not present

## 2016-05-24 DIAGNOSIS — M5440 Lumbago with sciatica, unspecified side: Secondary | ICD-10-CM | POA: Diagnosis not present

## 2016-05-24 DIAGNOSIS — Z6831 Body mass index (BMI) 31.0-31.9, adult: Secondary | ICD-10-CM | POA: Diagnosis not present

## 2016-05-24 DIAGNOSIS — M5136 Other intervertebral disc degeneration, lumbar region: Secondary | ICD-10-CM | POA: Diagnosis not present

## 2016-06-03 DIAGNOSIS — M25572 Pain in left ankle and joints of left foot: Secondary | ICD-10-CM | POA: Diagnosis not present

## 2016-06-03 DIAGNOSIS — M7662 Achilles tendinitis, left leg: Secondary | ICD-10-CM | POA: Diagnosis not present

## 2016-08-18 DIAGNOSIS — L720 Epidermal cyst: Secondary | ICD-10-CM | POA: Diagnosis not present

## 2016-08-18 DIAGNOSIS — L821 Other seborrheic keratosis: Secondary | ICD-10-CM | POA: Diagnosis not present

## 2016-08-18 DIAGNOSIS — L218 Other seborrheic dermatitis: Secondary | ICD-10-CM | POA: Diagnosis not present

## 2016-08-18 DIAGNOSIS — Z85828 Personal history of other malignant neoplasm of skin: Secondary | ICD-10-CM | POA: Diagnosis not present

## 2016-08-18 DIAGNOSIS — I788 Other diseases of capillaries: Secondary | ICD-10-CM | POA: Diagnosis not present

## 2016-08-18 DIAGNOSIS — D2371 Other benign neoplasm of skin of right lower limb, including hip: Secondary | ICD-10-CM | POA: Diagnosis not present

## 2016-08-18 DIAGNOSIS — D225 Melanocytic nevi of trunk: Secondary | ICD-10-CM | POA: Diagnosis not present

## 2016-08-18 DIAGNOSIS — D1801 Hemangioma of skin and subcutaneous tissue: Secondary | ICD-10-CM | POA: Diagnosis not present

## 2016-08-22 ENCOUNTER — Encounter (HOSPITAL_COMMUNITY): Payer: Self-pay | Admitting: Emergency Medicine

## 2016-08-22 ENCOUNTER — Emergency Department (HOSPITAL_COMMUNITY): Payer: Medicare Other

## 2016-08-22 ENCOUNTER — Observation Stay (HOSPITAL_COMMUNITY)
Admission: EM | Admit: 2016-08-22 | Discharge: 2016-08-25 | Disposition: A | Discharge status: Home / Self Care | Payer: Medicare Other | Attending: General Surgery | Admitting: General Surgery

## 2016-08-22 DIAGNOSIS — I493 Ventricular premature depolarization: Secondary | ICD-10-CM | POA: Diagnosis not present

## 2016-08-22 DIAGNOSIS — Z6831 Body mass index (BMI) 31.0-31.9, adult: Secondary | ICD-10-CM | POA: Diagnosis not present

## 2016-08-22 DIAGNOSIS — K8020 Calculus of gallbladder without cholecystitis without obstruction: Secondary | ICD-10-CM | POA: Diagnosis not present

## 2016-08-22 DIAGNOSIS — E669 Obesity, unspecified: Secondary | ICD-10-CM | POA: Insufficient documentation

## 2016-08-22 DIAGNOSIS — K219 Gastro-esophageal reflux disease without esophagitis: Secondary | ICD-10-CM | POA: Insufficient documentation

## 2016-08-22 DIAGNOSIS — Z7951 Long term (current) use of inhaled steroids: Secondary | ICD-10-CM | POA: Insufficient documentation

## 2016-08-22 DIAGNOSIS — M479 Spondylosis, unspecified: Secondary | ICD-10-CM | POA: Insufficient documentation

## 2016-08-22 DIAGNOSIS — I10 Essential (primary) hypertension: Secondary | ICD-10-CM | POA: Insufficient documentation

## 2016-08-22 DIAGNOSIS — K8000 Calculus of gallbladder with acute cholecystitis without obstruction: Secondary | ICD-10-CM | POA: Diagnosis not present

## 2016-08-22 DIAGNOSIS — R002 Palpitations: Secondary | ICD-10-CM | POA: Diagnosis not present

## 2016-08-22 DIAGNOSIS — J309 Allergic rhinitis, unspecified: Secondary | ICD-10-CM | POA: Diagnosis not present

## 2016-08-22 DIAGNOSIS — Z9049 Acquired absence of other specified parts of digestive tract: Secondary | ICD-10-CM | POA: Diagnosis not present

## 2016-08-22 DIAGNOSIS — Z86010 Personal history of colonic polyps: Secondary | ICD-10-CM | POA: Diagnosis not present

## 2016-08-22 DIAGNOSIS — Z79899 Other long term (current) drug therapy: Secondary | ICD-10-CM | POA: Insufficient documentation

## 2016-08-22 DIAGNOSIS — Z87891 Personal history of nicotine dependence: Secondary | ICD-10-CM | POA: Insufficient documentation

## 2016-08-22 DIAGNOSIS — R1011 Right upper quadrant pain: Secondary | ICD-10-CM

## 2016-08-22 LAB — CBC
HCT: 39.7 % (ref 36.0–46.0)
Hemoglobin: 12.8 g/dL (ref 12.0–15.0)
MCH: 29 pg (ref 26.0–34.0)
MCHC: 32.2 g/dL (ref 30.0–36.0)
MCV: 89.8 fL (ref 78.0–100.0)
Platelets: 248 10*3/uL (ref 150–400)
RBC: 4.42 MIL/uL (ref 3.87–5.11)
RDW: 12.7 % (ref 11.5–15.5)
WBC: 7.8 10*3/uL (ref 4.0–10.5)

## 2016-08-22 LAB — COMPREHENSIVE METABOLIC PANEL
ALT: 22 U/L (ref 14–54)
AST: 23 U/L (ref 15–41)
Albumin: 4 g/dL (ref 3.5–5.0)
Alkaline Phosphatase: 61 U/L (ref 38–126)
Anion gap: 6 (ref 5–15)
BUN: 18 mg/dL (ref 6–20)
CO2: 28 mmol/L (ref 22–32)
Calcium: 9.4 mg/dL (ref 8.9–10.3)
Chloride: 108 mmol/L (ref 101–111)
Creatinine, Ser: 0.89 mg/dL (ref 0.44–1.00)
GFR calc Af Amer: >60 mL/min (ref >60)
GFR calc non Af Amer: >60 mL/min (ref >60)
Glucose, Bld: 116 mg/dL — ABNORMAL HIGH (ref 65–99)
Potassium: 4.4 mmol/L (ref 3.5–5.1)
Sodium: 142 mmol/L (ref 135–145)
Total Bilirubin: 0.7 mg/dL (ref 0.3–1.2)
Total Protein: 7.1 g/dL (ref 6.5–8.1)

## 2016-08-22 LAB — URINE MICROSCOPIC-ADD ON

## 2016-08-22 LAB — LIPASE, BLOOD: Lipase: 23 U/L (ref 11–51)

## 2016-08-22 LAB — I-STAT TROPONIN, ED: Troponin i, poc: 0 ng/mL (ref 0.00–0.08)

## 2016-08-22 LAB — URINALYSIS, ROUTINE W REFLEX MICROSCOPIC
Bilirubin Urine: NEGATIVE
Glucose, UA: NEGATIVE mg/dL
Hgb urine dipstick: NEGATIVE
Ketones, ur: NEGATIVE mg/dL
Nitrite: NEGATIVE
Protein, ur: NEGATIVE mg/dL
Specific Gravity, Urine: 1.021 (ref 1.005–1.030)
pH: 6 (ref 5.0–8.0)

## 2016-08-22 LAB — GLUCOSE, CAPILLARY
Glucose-Capillary: 116 mg/dL — ABNORMAL HIGH (ref 65–99)
Glucose-Capillary: 103 mg/dL — ABNORMAL HIGH (ref 65–99)

## 2016-08-22 MED ORDER — BISACODYL 10 MG RE SUPP: 10.0000 mg | Freq: Every day | RECTAL | Status: DC | PRN

## 2016-08-22 MED ORDER — SENNOSIDES-DOCUSATE SODIUM 8.6-50 MG PO TABS: 1.0000 | ORAL_TABLET | Freq: Every evening | ORAL | Status: DC | PRN

## 2016-08-22 MED ORDER — ONDANSETRON HCL 4 MG/2ML IJ SOLN
4.0000 mg | Freq: Once | INTRAMUSCULAR | Status: AC
  Administered 2016-08-22: 4 mg via INTRAVENOUS
  Filled 2016-08-22: qty 2

## 2016-08-22 MED ORDER — DEXTROSE 5 % IV SOLN
2.0000 g | INTRAVENOUS | Status: DC
  Administered 2016-08-22 – 2016-08-24 (×3): 2 g via INTRAVENOUS
  Filled 2016-08-22 (×3): qty 2

## 2016-08-22 MED ORDER — HYDROCODONE-ACETAMINOPHEN 5-325 MG PO TABS: 1.0000 | ORAL_TABLET | ORAL | Status: DC | PRN

## 2016-08-22 MED ORDER — ACETAMINOPHEN 650 MG RE SUPP: 650.0000 mg | Freq: Four times a day (QID) | RECTAL | Status: DC | PRN

## 2016-08-22 MED ORDER — DIPHENHYDRAMINE HCL 12.5 MG/5ML PO ELIX: 12.5000 mg | ORAL_SOLUTION | Freq: Four times a day (QID) | ORAL | Status: DC | PRN

## 2016-08-22 MED ORDER — MORPHINE SULFATE (PF) 4 MG/ML IV SOLN
4.0000 mg | Freq: Once | INTRAVENOUS | Status: AC
  Administered 2016-08-22: 4 mg via INTRAVENOUS
  Filled 2016-08-22: qty 1

## 2016-08-22 MED ORDER — SODIUM CHLORIDE 0.45 % IV SOLN
INTRAVENOUS | Status: DC
  Administered 2016-08-22: 17:00:00 via INTRAVENOUS
  Administered 2016-08-23: 100 mL/h via INTRAVENOUS

## 2016-08-22 MED ORDER — ONDANSETRON HCL 4 MG/2ML IJ SOLN
4.0000 mg | Freq: Four times a day (QID) | INTRAMUSCULAR | Status: DC | PRN
  Administered 2016-08-22 – 2016-08-24 (×4): 4 mg via INTRAVENOUS
  Filled 2016-08-22 (×4): qty 2

## 2016-08-22 MED ORDER — HYDROMORPHONE HCL 1 MG/ML IJ SOLN
1.0000 mg | INTRAMUSCULAR | Status: DC | PRN
  Administered 2016-08-22 – 2016-08-24 (×6): 1 mg via INTRAVENOUS
  Filled 2016-08-22 (×7): qty 1

## 2016-08-22 MED ORDER — METHOCARBAMOL 500 MG PO TABS: 500.0000 mg | ORAL_TABLET | Freq: Four times a day (QID) | ORAL | Status: DC | PRN

## 2016-08-22 MED ORDER — ONDANSETRON 4 MG PO TBDP
4.0000 mg | ORAL_TABLET | Freq: Four times a day (QID) | ORAL | Status: DC | PRN
  Administered 2016-08-24 – 2016-08-25 (×2): 4 mg via ORAL
  Filled 2016-08-22 (×2): qty 1

## 2016-08-22 MED ORDER — DIPHENHYDRAMINE HCL 50 MG/ML IJ SOLN: 12.5000 mg | Freq: Four times a day (QID) | INTRAMUSCULAR | Status: DC | PRN

## 2016-08-22 MED ORDER — ENOXAPARIN SODIUM 40 MG/0.4ML ~~LOC~~ SOLN
40.0000 mg | SUBCUTANEOUS | Status: DC
  Administered 2016-08-22 – 2016-08-23 (×2): 40 mg via SUBCUTANEOUS
  Filled 2016-08-22: qty 0.4

## 2016-08-22 MED ORDER — MORPHINE SULFATE (PF) 4 MG/ML IV SOLN
6.0000 mg | Freq: Once | INTRAVENOUS | Status: AC
Start: 2016-08-22 — End: 2016-08-22
  Administered 2016-08-22: 6 mg via INTRAVENOUS
  Filled 2016-08-22: qty 2

## 2016-08-22 MED ORDER — ACETAMINOPHEN 325 MG PO TABS
650.0000 mg | ORAL_TABLET | Freq: Four times a day (QID) | ORAL | Status: DC | PRN
  Administered 2016-08-24 – 2016-08-25 (×4): 650 mg via ORAL
  Filled 2016-08-22 (×4): qty 2

## 2016-08-22 MED ORDER — COSYNTROPIN 0.25 MG IJ SOLR
0.2500 mg | Freq: Once | INTRAMUSCULAR | Status: DC
  Filled 2016-08-22: qty 0.25

## 2016-08-22 NOTE — ED Notes (Signed)
AWAITING SURGICAL MD TO MAKE FINAL DECISION ON ADMISSION

## 2016-08-22 NOTE — ED Provider Notes (Signed)
[facility] DEPT Provider Note   CSN: [id number] Arrival date & time: 08/22/16  [id number]     History   Chief Complaint Chief Complaint  Patient presents with  . Abdominal Pain    HPI Carrie [name] is a 70 y.o. female with a past medical history of PVCs, GERD, esophageal spasms who presents to the ED today complaining of abdominal pain. Patient states that she was woken from sleep around 4 AM this morning with severe pain in her right upper quadrant/epigastrium that radiates to her right middle back. Patient states her current pain is constant and feels ago severe cramp. Patient states that she has been diagnosed with esophageal spasms approximately 2-3 years ago. She has taken Levsin, Protonix and Zantac for this in the past. Patient states she has had pain similar to this in the past but it has never been this bad before and was always attributed to esophageal spasms. However, she has not had any pain in the last year as she thought it was well controlled with Zantac. Pt states that she can typically take a dose of Levcin when she gets pain and it will resolve within 1 hour but today the pain has not gone away. Pt reports associated nausea but no vomiting. She denies any chest pain, SOB, dizziness, paresthesias, diarrhea, dysuria. Last BM was this morning.  Pt had colonoscopy last year and was normal per pt. Pt had hemicolectomy in 1998 for colonic mass (benign). Last EGD was 6 years ago revealed 1 polyp but was otherwise normal per pt.  HPI  Past Medical History:  Diagnosis Date  . Colon polyps   . Esophageal spasm   . Fatigue   . Frequent PVCs   . GERD (gastroesophageal reflux disease)   . IBS (irritable bowel syndrome)   . Palpitations   . Recurrent UTI   . Seborrheic dermatitis of scalp 03/06/13  . Vitamin B 12 deficiency     Patient Active Problem List   Diagnosis Date Noted  . Colon polyp 11/24/2015  . Acid reflux 11/24/2015  . Osteoarthritis of spine with radiculopathy,  lumbar region 11/24/2015  . Hereditary and idiopathic peripheral neuropathy 06/19/2015  . Osteopenia 05/28/2014  . Essential hypertension 03/05/2014  . Palpitations 12/26/2013  . Premature ventricular contractions 12/26/2013  . Adaptive colitis 03/27/2013  . Ex-smoker 03/06/2013  . Frequent UTI 03/06/2013  . Seborrhea capitis 03/06/2013  . Allergic rhinitis 01/03/2012  . Beat, premature ventricular 01/03/2012    Past Surgical History:  Procedure Laterality Date  . COLECTOMY Right 1998   large colonic polyp  . ENDOSCOPIC VEIN LASER TREATMENT Bilateral 2010-2011  . [place]  2001  . SPINE SURGERY     3 since 11/2010, laminoectomy, lumbar fusion, diskectomy    OB History    No data available       Home Medications    Prior to Admission medications   Medication Sig Start Date End Date Taking? Authorizing Provider  betamethasone dipropionate 0.05 % lotion daily. 10/02/15   Historical Provider, MD  calcium carbonate (TUMS CALCIUM FOR LIFE BONE) 750 MG chewable tablet Chew 1 tablet by mouth daily.     Historical Provider, MD  Cholecalciferol (VITAMIN D) 2000 UNITS tablet Take 1 tablet by mouth daily.    Historical Provider, MD  Ciclopirox 1 % shampoo Apply topically. 04/04/14   Historical Provider, MD  diclofenac sodium (VOLTAREN) 1 % GEL Apply 2 g topically as directed. 03/21/16   Max T Hyatt, DPM  doxycycline (VIBRAMYCIN)  100 MG capsule Take 1 capsule by mouth 2 (two) times daily. 03/01/16   Historical Provider, MD  fexofenadine (ALLEGRA) 180 MG tablet Take 180 mg by mouth as needed for allergies or rhinitis.     Historical Provider, MD  fluticasone (FLONASE) 50 MCG/ACT nasal spray Place 2 sprays into the nose. 04/04/14   Historical Provider, MD  FLUZONE HIGH-DOSE 0.5 ML SUSY inject 0.5 milliliter intramuscularly 09/28/15   Historical Provider, MD  HYDROcodone-acetaminophen (NORCO/VICODIN) 5-325 MG tablet Take 1 tablet by mouth as directed. Pt take a half tablet as needed for  pain 01/28/16   Historical Provider, MD  hyoscyamine (LEVSIN/SL) 0.125 MG SL tablet Take 0.125 mg by mouth as needed.  03/06/13   Historical Provider, MD  NORPACE CR 150 MG 12 hr capsule Take 1 capsule (150 mg total) by mouth 2 (two) times daily. 01/27/16   Thompson Grayer, MD  Probiotic Product (ACIDOPHILUS/GOAT MILK) CAPS Take 1 capsule by mouth daily.    Historical Provider, MD  ranitidine (ZANTAC) 150 MG tablet Take 150 mg by mouth 2 (two) times daily. 10/16/15   Historical Provider, MD  trimethoprim (TRIMPEX) 100 MG tablet as needed. One pill after sex may repeat in 12 hours and if symptoms are present may take one twice daily for three days 03/06/13   Historical Provider, MD    Family History Family History  Problem Relation Age of Onset  . Hypertension Mother   . Cancer Father   . Hypertension Sister   . Hypertension Sister   . Hypertension Sister   . Hypertension Sister   . Cancer Sister     bladder    Social History Social History  Substance Use Topics  . Smoking status: Former Smoker    Quit date: 02/02/2013  . Smokeless tobacco: Not on file  . Alcohol use 0.0 oz/week     Comment: 1-2 glass of wine per week     Allergies   Azithromycin; Epinephrine; Erythromycin; and Nitrofurantoin   Review of Systems Review of Systems  All other systems reviewed and are negative.    Physical Exam Updated Vital Signs BP 177/78   Pulse 89   Temp 98.2 F (36.8 C) (Oral)   Resp 16   SpO2 100%   Physical Exam  Constitutional: She is oriented to person, place, and time. She appears well-developed and well-nourished. No distress.  HENT:  Head: Normocephalic and atraumatic.  Mouth/Throat: No oropharyngeal exudate.  Eyes: Conjunctivae and EOM are normal. Pupils are equal, round, and reactive to light. Right eye exhibits no discharge. Left eye exhibits no discharge. No scleral icterus.  Cardiovascular: Normal rate, regular rhythm, normal heart sounds and intact distal pulses.  Exam  reveals no gallop and no friction rub.   No murmur heard. Pulmonary/Chest: Effort normal and breath sounds normal. No respiratory distress. She has no wheezes. She has no rales. She exhibits no tenderness.  Abdominal: Soft. Bowel sounds are normal. She exhibits no distension. There is tenderness ( RUQ). There is no guarding.  Positive Murphy's sign. No peritoneal sign.  Musculoskeletal: Normal range of motion. She exhibits no edema.  Neurological: She is alert and oriented to person, place, and time.  Skin: Skin is warm and dry. No rash noted. She is not diaphoretic. No erythema. No pallor.  Psychiatric: She has a normal mood and affect. Her behavior is normal.  Nursing note and vitals reviewed.    ED Treatments / Results  Labs (all labs ordered are listed, but only abnormal results  are displayed) Labs Reviewed  COMPREHENSIVE METABOLIC PANEL - Abnormal; Notable for the following:       Result Value   Glucose, Bld 116 (*)    All other components within normal limits  URINALYSIS, ROUTINE W REFLEX MICROSCOPIC (NOT AT [facility]) - Abnormal; Notable for the following:    APPearance CLOUDY (*)    Leukocytes, UA MODERATE (*)    All other components within normal limits  URINE MICROSCOPIC-ADD ON - Abnormal; Notable for the following:    Squamous Epithelial / LPF 0-5 (*)    Bacteria, UA RARE (*)    All other components within normal limits  LIPASE, BLOOD  CBC  I-STAT TROPOININ, ED    EKG  EKG Interpretation  Date/Time:  Monday August 22 2016 09:46:06 EDT Ventricular Rate:  65 PR Interval:    QRS Duration: 109 QT Interval:  457 QTC Calculation: 476 R Axis:     Text Interpretation:  Sinus rhythm Probable anteroseptal infarct, old No significant change was found Confirmed by CAMPOS  MD, KEVIN ([id number]) on 08/22/2016 11:25:18 AM       Radiology US Abdomen Limited Ruq  Result Date: 08/22/2016 CLINICAL DATA:  Acute right upper quadrant abdominal pain. EXAM: US ABDOMEN LIMITED - RIGHT  UPPER QUADRANT COMPARISON:  None. FINDINGS: Gallbladder: Multiple gallstones are noted with the largest measuring 1.3 cm. No significant gallbladder wall thickening or pericholecystic fluid is noted. No sonographic Murphy's sign is noted. Common bile duct: Diameter: 3.3 mm which is within normal limits. Liver: No focal lesion identified. Increased echogenicity is noted consistent with fatty infiltration. IMPRESSION: Cholelithiasis is noted without gallbladder wall thickening or pericholecystic fluid. No sonographic Murphy's sign is noted. If there is clinical concern for cholecystitis, HIDA scan may be performed for further evaluation. Fatty infiltration of the liver. Electronically Signed   By: Marijo Conception, M.D.   On: 08/22/2016 10:25    Procedures Procedures (including critical care time)  Medications Ordered in ED Medications  morphine 4 MG/ML injection 4 mg (not administered)     Initial Impression / Assessment and Plan / ED Course  I have reviewed the triage vital signs and the nursing notes.  Pertinent labs & imaging results that were available during my care of the patient were reviewed by me and considered in my medical decision making (see chart for details).  70 year old female with history of esophageal spasm presents to the ED today complaining of acute onset right upper quadrant abdominal pain with associated nausea. Pain radiates to her back. On presentation to ED, patient appears uncomfortable. All vital signs are stable. She is focally tender in her right upper quadrant with a positive Murphy sign. EKG unremarkable. No leukocytosis. Negative troponin. Lipase within normal limits. Concern for biliary colic. Right upper quadrant ultrasound reveals cholelithiasis without gallbladder wall thickening or pericholecystic fluid. No indication of cholecystitis or choledocholithiasis. Pain was unable to be controlled in the ED even after 10 mg of morphine. Will consult with general  surgery. Patient will likely need cholecystectomy sooner rather than later due to ongoing pain.  Clinical Course  Comment By Time  Spoke with general surgery PA Brooke who will consult pt in ED Carlos Levering, PA-C 09/18 1410   General surgery will admit pt to their service.  Patient was discussed with and seen by Dr. Venora Maples who agrees with the treatment plan.    Final Clinical Impressions(s) / ED Diagnoses   Final diagnoses:  RUQ pain    New Prescriptions New  Prescriptions   No medications on file     Carlos Levering, PA-C 08/22/16 [place], MD 08/23/16 1534

## 2016-08-22 NOTE — ED Notes (Signed)
Patient is in pain and doesn't feel like she can give a urine sample at this time. Patient will call out when ready. Gave patient call bell

## 2016-08-22 NOTE — ED Notes (Signed)
Ultrasound at bedside

## 2016-08-22 NOTE — ED Notes (Signed)
Surgical PA Brooke at bedside.Aware of admission

## 2016-08-22 NOTE — ED Notes (Signed)
PT MADE AWARE OF  THIS WRITER'S VERIFICATION OF DELAY AND PAIN MEDICATION

## 2016-08-22 NOTE — ED Triage Notes (Signed)
Pt c/o sudden onset RUQ abdominal pain that radiates to R middle back. Pt has hx of the same pain and has been dx with esophageal spasms in the past. Pt sts this pain woke her out of sleep at 4:30am. Denies N/V/D, urinary symptoms. A&Ox4 and ambulatory. Pt appears very uncomfortable in triage.

## 2016-08-22 NOTE — H&P (Signed)
[facility] Surgery Admission Note  Carrie [name] [facility] [birth year]  [id number].    Requesting MD: Jola Schmidt Chief Complaint/Reason for Consult: Biliary colic with stones  HPI:  Carrie [name] is a 70yo female who presented to [facility] earlier today with sudden onset severe RUQ abdominal pain and associated nausea. States that for the last couple of days she has had a bit of an upset stomach and increased burping, but this morning she was awoken from her sleep around 4am with a severe/stabbing pain. Her pain is mostly RUQ/epigastrium and radiates to her right middle back. Patient states she has had pain similar to this in the past but to a lesser degree, and she always attributed it to esophageal spasms. Patient states that she has been diagnosed with esophageal spasms approximately 2-3 years ago; she has taken Levsin, Protonix and Zantac for this in the past. Denies fevers, emesis, dysuria, diarrhea, melena, hematochezia, or constipation. Last meal was yesterday  LFT's and WBC are normal, RUQ u/s showed cholelithiasis without gallbladder wall thickening or pericholecystic fluid.  PMH significant for PVC's, GERD, esophageal spasms Abdominal surgical history includes right hemicolectomy in 1998 Dr. Margot Chimes for colonic mass (benign), rectocele repair 2001 Colonoscopy last year was normal per patient Last EGD 6 years ago showed 1 polyp but otherwise normal per patient Former smoker She does not take any anticoagulants Retired Marine scientist  ROS: All systems reviewed and otherwise negative except for as above  Family History  Problem Relation Age of Onset  . Hypertension Mother   . Cancer Father   . Hypertension Sister   . Hypertension Sister   . Hypertension Sister   . Hypertension Sister   . Cancer Sister     bladder    Past Medical History:  Diagnosis Date  . Colon polyps   . Esophageal spasm   . Fatigue   . Frequent PVCs   . GERD (gastroesophageal reflux disease)   . IBS  (irritable bowel syndrome)   . Palpitations   . Recurrent UTI   . Seborrheic dermatitis of scalp 03/06/13  . Vitamin B 12 deficiency     Past Surgical History:  Procedure Laterality Date  . COLECTOMY Right 1998   large colonic polyp  . ENDOSCOPIC VEIN LASER TREATMENT Bilateral 2010-2011  . [place]  2001  . SPINE SURGERY     3 since 11/2010, laminoectomy, lumbar fusion, diskectomy    Social History:  reports that she quit smoking about 3 years ago. She does not have any smokeless tobacco history on file. She reports that she drinks alcohol. She reports that she does not use drugs.  Allergies:  Allergies  Allergen Reactions  . Azithromycin Other (See Comments)    Including macrobid  (weakness)  . Epinephrine Other (See Comments)    Heart arrythmias  . Erythromycin Diarrhea and Nausea And Vomiting  . Nitrofurantoin Nausea Only and Other (See Comments)    Weakness     (Not in a hospital admission)  Blood pressure 159/69, pulse 74, temperature 98.4 F (36.9 C), temperature source Oral, resp. rate 16, SpO2 97 %.  Physical Exam: General: pleasant, WD/WN white female who is laying in bed in NAD HEENT: head is normocephalic, atraumatic.  Sclera are noninjected.  Mouth is pink and moist Heart: regular, rate, and rhythm.  No obvious murmurs, gallops, or rubs noted.  Palpable pedal pulses bilaterally Lungs: CTAB, no wheezes, rhonchi, or rales noted.  Respiratory effort nonlabored Abd: well healed transverse surgical scar noted right-middle abdomen.  Soft, ND, +BS, no masses, hernias, or organomegaly. TTP epigastrium and RUQ MS: all 4 extremities are symmetrical with no cyanosis, clubbing, or edema. Skin: warm and dry with no masses, lesions, or rashes Psych: A&Ox3 with an appropriate affect.  Results for orders placed or performed during the hospital encounter of 08/22/16 (from the past 48 hour(s))  Lipase, blood     Status: None   Collection Time: 08/22/16  8:54 AM   Result Value Ref Range   Lipase 23 11 - 51 U/L  Comprehensive metabolic panel     Status: Abnormal   Collection Time: 08/22/16  8:54 AM  Result Value Ref Range   Sodium 142 135 - 145 mmol/L   Potassium 4.4 3.5 - 5.1 mmol/L   Chloride 108 101 - 111 mmol/L   CO2 28 22 - 32 mmol/L   Glucose, Bld 116 (H) 65 - 99 mg/dL   BUN 18 6 - 20 mg/dL   Creatinine, Ser 0.89 0.44 - 1.00 mg/dL   Calcium 9.4 8.9 - 10.3 mg/dL   Total Protein 7.1 6.5 - 8.1 g/dL   Albumin 4.0 3.5 - 5.0 g/dL   AST 23 15 - 41 U/L   ALT 22 14 - 54 U/L   Alkaline Phosphatase 61 38 - 126 U/L   Total Bilirubin 0.7 0.3 - 1.2 mg/dL   GFR calc non Af Amer >60 >60 mL/min   GFR calc Af Amer >60 >60 mL/min    Comment: (NOTE) The eGFR has been calculated using the CKD EPI equation. This calculation has not been validated in all clinical situations. eGFR's persistently <60 mL/min signify possible Chronic Kidney Disease.    Anion gap 6 5 - 15  CBC     Status: None   Collection Time: 08/22/16  8:54 AM  Result Value Ref Range   WBC 7.8 4.0 - 10.5 K/uL   RBC 4.42 3.87 - 5.11 MIL/uL   Hemoglobin 12.8 12.0 - 15.0 g/dL   HCT 39.7 36.0 - 46.0 %   MCV 89.8 78.0 - 100.0 fL   MCH 29.0 26.0 - 34.0 pg   MCHC 32.2 30.0 - 36.0 g/dL   RDW 12.7 11.5 - 15.5 %   Platelets 248 150 - 400 K/uL  I-stat troponin, ED     Status: None   Collection Time: 08/22/16  9:31 AM  Result Value Ref Range   Troponin i, poc 0.00 0.00 - 0.08 ng/mL   Comment 3            Comment: Due to the release kinetics of cTnI, a negative result within the first hours of the onset of symptoms does not rule out myocardial infarction with certainty. If myocardial infarction is still suspected, repeat the test at appropriate intervals.   Urinalysis, Routine w reflex microscopic     Status: Abnormal   Collection Time: 08/22/16 12:27 PM  Result Value Ref Range   Color, Urine YELLOW YELLOW   APPearance CLOUDY (A) CLEAR   Specific Gravity, Urine 1.021 1.005 - 1.030    pH 6.0 5.0 - 8.0   Glucose, UA NEGATIVE NEGATIVE mg/dL   Hgb urine dipstick NEGATIVE NEGATIVE   Bilirubin Urine NEGATIVE NEGATIVE   Ketones, ur NEGATIVE NEGATIVE mg/dL   Protein, ur NEGATIVE NEGATIVE mg/dL   Nitrite NEGATIVE NEGATIVE   Leukocytes, UA MODERATE (A) NEGATIVE  Urine microscopic-add on     Status: Abnormal   Collection Time: 08/22/16 12:27 PM  Result Value Ref Range   Squamous Epithelial /  LPF 0-5 (A) NONE SEEN   WBC, UA 6-30 0 - 5 WBC/hpf   RBC / HPF 0-5 0 - 5 RBC/hpf   Bacteria, UA RARE (A) NONE SEEN   US Abdomen Limited Ruq  Result Date: 08/22/2016 CLINICAL DATA:  Acute right upper quadrant abdominal pain. EXAM: US ABDOMEN LIMITED - RIGHT UPPER QUADRANT COMPARISON:  None. FINDINGS: Gallbladder: Multiple gallstones are noted with the largest measuring 1.3 cm. No significant gallbladder wall thickening or pericholecystic fluid is noted. No sonographic Murphy's sign is noted. Common bile duct: Diameter: 3.3 mm which is within normal limits. Liver: No focal lesion identified. Increased echogenicity is noted consistent with fatty infiltration. IMPRESSION: Cholelithiasis is noted without gallbladder wall thickening or pericholecystic fluid. No sonographic Murphy's sign is noted. If there is clinical concern for cholecystitis, HIDA scan may be performed for further evaluation. Fatty infiltration of the liver. Electronically Signed   By: Marijo Conception, M.D.   On: 08/22/2016 10:25      Assessment/Plan RUQ/epigastric pain - normal LFT's and WBC - RUQ u/s showed cholelithiasis without gallbladder wall thickening or pericholecystic fluid - patient concerned that if she goes home to follow-up outpatient for cholecystectomy she may end up returning to ED due to pain - recommend admit to med surg - NPO, IVF, pain control, antiemetics, antibiotics - plan for cholecystectomy tomorrow - SCD's and lovenox for DVT proph    Jerrye Beavers, [facility] 08/22/2016,  2:17 PM Pager: [phone] Consults: [phone] Mon-Fri 7:00 am-4:30 pm Sat-Sun 7:00 am-11:30 am

## 2016-08-22 NOTE — ED Notes (Signed)
INQUIRING ON DELAY IN BED ASSIGNMENT. SURGERY PAGED

## 2016-08-22 NOTE — ED Notes (Signed)
Reminded pt urine sample needed, stated she still cannot void will call when ready. Also requesting pain meds

## 2016-08-22 NOTE — Progress Notes (Signed)
Not  Sure if Cortryson was order for this patient.  Ask MD and not ordered this order will be discontinues.  Roland Rack, RN

## 2016-08-23 ENCOUNTER — Observation Stay (HOSPITAL_COMMUNITY): Payer: Medicare Other | Admitting: Certified Registered"

## 2016-08-23 ENCOUNTER — Encounter (HOSPITAL_COMMUNITY)
Admission: EM | Disposition: A | Discharge status: Home / Self Care | Payer: Self-pay | Source: Home / Self Care | Attending: Emergency Medicine

## 2016-08-23 ENCOUNTER — Encounter (HOSPITAL_COMMUNITY): Payer: Self-pay | Admitting: Certified Registered"

## 2016-08-23 DIAGNOSIS — R002 Palpitations: Secondary | ICD-10-CM | POA: Diagnosis not present

## 2016-08-23 DIAGNOSIS — Z87891 Personal history of nicotine dependence: Secondary | ICD-10-CM | POA: Diagnosis not present

## 2016-08-23 DIAGNOSIS — K8020 Calculus of gallbladder without cholecystitis without obstruction: Secondary | ICD-10-CM | POA: Diagnosis not present

## 2016-08-23 DIAGNOSIS — Z79899 Other long term (current) drug therapy: Secondary | ICD-10-CM | POA: Diagnosis not present

## 2016-08-23 DIAGNOSIS — Z9049 Acquired absence of other specified parts of digestive tract: Secondary | ICD-10-CM | POA: Diagnosis not present

## 2016-08-23 DIAGNOSIS — R1011 Right upper quadrant pain: Secondary | ICD-10-CM | POA: Diagnosis not present

## 2016-08-23 DIAGNOSIS — K219 Gastro-esophageal reflux disease without esophagitis: Secondary | ICD-10-CM | POA: Diagnosis not present

## 2016-08-23 DIAGNOSIS — I10 Essential (primary) hypertension: Secondary | ICD-10-CM | POA: Diagnosis not present

## 2016-08-23 DIAGNOSIS — Z86010 Personal history of colonic polyps: Secondary | ICD-10-CM | POA: Diagnosis not present

## 2016-08-23 DIAGNOSIS — K8000 Calculus of gallbladder with acute cholecystitis without obstruction: Secondary | ICD-10-CM | POA: Diagnosis not present

## 2016-08-23 DIAGNOSIS — I493 Ventricular premature depolarization: Secondary | ICD-10-CM | POA: Diagnosis not present

## 2016-08-23 DIAGNOSIS — K819 Cholecystitis, unspecified: Secondary | ICD-10-CM | POA: Diagnosis not present

## 2016-08-23 DIAGNOSIS — J309 Allergic rhinitis, unspecified: Secondary | ICD-10-CM | POA: Diagnosis not present

## 2016-08-23 DIAGNOSIS — M479 Spondylosis, unspecified: Secondary | ICD-10-CM | POA: Diagnosis not present

## 2016-08-23 DIAGNOSIS — Z7951 Long term (current) use of inhaled steroids: Secondary | ICD-10-CM | POA: Diagnosis not present

## 2016-08-23 HISTORY — PX: CHOLECYSTECTOMY: SHX55

## 2016-08-23 LAB — SURGICAL PCR SCREEN
MRSA, PCR: NEGATIVE
Staphylococcus aureus: POSITIVE — AB

## 2016-08-23 SURGERY — LAPAROSCOPIC CHOLECYSTECTOMY
Anesthesia: General | Site: Abdomen

## 2016-08-23 MED ORDER — SUGAMMADEX SODIUM 200 MG/2ML IV SOLN
INTRAVENOUS | Status: DC | PRN
Start: 2016-08-23 — End: 2016-08-23
  Administered 2016-08-23: 200 mg via INTRAVENOUS

## 2016-08-23 MED ORDER — DEXAMETHASONE SODIUM PHOSPHATE 10 MG/ML IJ SOLN
INTRAMUSCULAR | Status: AC
  Filled 2016-08-23: qty 1

## 2016-08-23 MED ORDER — CEFAZOLIN SODIUM-DEXTROSE 2-4 GM/100ML-% IV SOLN
INTRAVENOUS | Status: AC
  Filled 2016-08-23: qty 100

## 2016-08-23 MED ORDER — SUGAMMADEX SODIUM 200 MG/2ML IV SOLN
INTRAVENOUS | Status: AC
  Filled 2016-08-23: qty 2

## 2016-08-23 MED ORDER — FENTANYL CITRATE (PF) 100 MCG/2ML IJ SOLN
INTRAMUSCULAR | Status: AC
  Filled 2016-08-23: qty 2

## 2016-08-23 MED ORDER — FENTANYL CITRATE (PF) 100 MCG/2ML IJ SOLN
25.0000 ug | INTRAMUSCULAR | Status: DC | PRN
  Administered 2016-08-23: 50 ug via INTRAVENOUS
  Administered 2016-08-23 (×2): 25 ug via INTRAVENOUS

## 2016-08-23 MED ORDER — FENTANYL CITRATE (PF) 100 MCG/2ML IJ SOLN
INTRAMUSCULAR | Status: AC
  Administered 2016-08-23: 50 ug via INTRAVENOUS
  Filled 2016-08-23: qty 2

## 2016-08-23 MED ORDER — CEFAZOLIN SODIUM-DEXTROSE 2-3 GM-% IV SOLR
INTRAVENOUS | Status: DC | PRN
  Administered 2016-08-23: 2 g via INTRAVENOUS

## 2016-08-23 MED ORDER — DEXAMETHASONE SODIUM PHOSPHATE 10 MG/ML IJ SOLN
INTRAMUSCULAR | Status: DC | PRN
  Administered 2016-08-23: 10 mg via INTRAVENOUS

## 2016-08-23 MED ORDER — SODIUM CHLORIDE 0.9 % IV SOLN
INTRAVENOUS | Status: DC
  Administered 2016-08-23 – 2016-08-24 (×3): via INTRAVENOUS

## 2016-08-23 MED ORDER — ONDANSETRON HCL 4 MG/2ML IJ SOLN: 4.0000 mg | Freq: Once | INTRAMUSCULAR | Status: DC | PRN

## 2016-08-23 MED ORDER — LIDOCAINE 2% (20 MG/ML) 5 ML SYRINGE
INTRAMUSCULAR | Status: DC | PRN
  Administered 2016-08-23: 20 mg via INTRAVENOUS

## 2016-08-23 MED ORDER — LACTATED RINGERS IV SOLN
INTRAVENOUS | Status: DC
  Administered 2016-08-23: 09:00:00 via INTRAVENOUS

## 2016-08-23 MED ORDER — ROCURONIUM BROMIDE 10 MG/ML (PF) SYRINGE
PREFILLED_SYRINGE | INTRAVENOUS | Status: DC | PRN
  Administered 2016-08-23: 40 mg via INTRAVENOUS

## 2016-08-23 MED ORDER — LACTATED RINGERS IR SOLN
Status: DC | PRN
  Administered 2016-08-23: 1000 mL

## 2016-08-23 MED ORDER — BUPIVACAINE-EPINEPHRINE (PF) 0.5% -1:200000 IJ SOLN
INTRAMUSCULAR | Status: AC
  Filled 2016-08-23: qty 30

## 2016-08-23 MED ORDER — MIDAZOLAM HCL 2 MG/2ML IJ SOLN
INTRAMUSCULAR | Status: AC
  Filled 2016-08-23: qty 2

## 2016-08-23 MED ORDER — SUCCINYLCHOLINE CHLORIDE 200 MG/10ML IV SOSY
PREFILLED_SYRINGE | INTRAVENOUS | Status: DC | PRN
  Administered 2016-08-23: 100 mg via INTRAVENOUS

## 2016-08-23 MED ORDER — PROPOFOL 10 MG/ML IV BOLUS
INTRAVENOUS | Status: AC
  Filled 2016-08-23: qty 20

## 2016-08-23 MED ORDER — BUPIVACAINE-EPINEPHRINE 0.5% -1:200000 IJ SOLN
INTRAMUSCULAR | Status: DC | PRN
  Administered 2016-08-23: 22 mL

## 2016-08-23 MED ORDER — 0.9 % SODIUM CHLORIDE (POUR BTL) OPTIME
TOPICAL | Status: DC | PRN
  Administered 2016-08-23: 1000 mL

## 2016-08-23 MED ORDER — ONDANSETRON HCL 4 MG/2ML IJ SOLN
INTRAMUSCULAR | Status: DC | PRN
  Administered 2016-08-23: 4 mg via INTRAVENOUS

## 2016-08-23 MED ORDER — PROPOFOL 10 MG/ML IV BOLUS
INTRAVENOUS | Status: DC | PRN
Start: 2016-08-23 — End: 2016-08-23
  Administered 2016-08-23: 100 mg via INTRAVENOUS

## 2016-08-23 MED ORDER — MIDAZOLAM HCL 5 MG/5ML IJ SOLN
INTRAMUSCULAR | Status: DC | PRN
  Administered 2016-08-23: 2 mg via INTRAVENOUS

## 2016-08-23 MED ORDER — ONDANSETRON HCL 4 MG/2ML IJ SOLN
INTRAMUSCULAR | Status: AC
  Filled 2016-08-23: qty 2

## 2016-08-23 MED ORDER — FENTANYL CITRATE (PF) 100 MCG/2ML IJ SOLN
INTRAMUSCULAR | Status: DC | PRN
  Administered 2016-08-23: 50 ug via INTRAVENOUS
  Administered 2016-08-23: 100 ug via INTRAVENOUS

## 2016-08-23 MED ORDER — IOPAMIDOL (ISOVUE-300) INJECTION 61%
INTRAVENOUS | Status: AC
  Filled 2016-08-23: qty 50

## 2016-08-23 MED ORDER — ROCURONIUM BROMIDE 10 MG/ML (PF) SYRINGE
PREFILLED_SYRINGE | INTRAVENOUS | Status: AC
  Filled 2016-08-23: qty 10

## 2016-08-23 SURGICAL SUPPLY — 46 items
APL SKNCLS STERI-STRIP NONHPOA (GAUZE/BANDAGES/DRESSINGS)
APPLIER CLIP 5 13 M/L LIGAMAX5 (MISCELLANEOUS) ×3
APPLIER CLIP ROT 10 11.4 M/L (STAPLE)
APR CLP MED LRG 11.4X10 (STAPLE)
BAG SPEC RTRVL 10 TROC 200 (ENDOMECHANICALS) ×1
BENZOIN TINCTURE PRP APPL 2/3 (GAUZE/BANDAGES/DRESSINGS) IMPLANT
CLIP APPLIE 5 13 M/L LIGAMAX5 (MISCELLANEOUS) ×1 IMPLANT
CLIP APPLIE ROT 10 11.4 M/L (STAPLE) IMPLANT
CLOSURE WOUND 1/2 X4 (GAUZE/BANDAGES/DRESSINGS)
COVER MAYO STAND STRL (DRAPES) IMPLANT
COVER SURGICAL LIGHT HANDLE (MISCELLANEOUS) ×3 IMPLANT
DECANTER SPIKE VIAL GLASS SM (MISCELLANEOUS) ×3 IMPLANT
DEVICE TROCAR PUNCTURE CLOSURE (ENDOMECHANICALS) ×3 IMPLANT
DRAPE C-ARM 42X120 X-RAY (DRAPES) IMPLANT
ELECT REM PT RETURN 9FT ADLT (ELECTROSURGICAL) ×3
ELECTRODE REM PT RTRN 9FT ADLT (ELECTROSURGICAL) ×1 IMPLANT
GLOVE BIO SURGEON STRL SZ 6.5 (GLOVE) ×2 IMPLANT
GLOVE BIO SURGEON STRL SZ7 (GLOVE) ×3 IMPLANT
GLOVE BIO SURGEONS STRL SZ 6.5 (GLOVE) ×1
GLOVE BIOGEL PI IND STRL 7.0 (GLOVE) ×1 IMPLANT
GLOVE BIOGEL PI IND STRL 7.5 (GLOVE) ×1 IMPLANT
GLOVE BIOGEL PI INDICATOR 7.0 (GLOVE) ×2
GLOVE BIOGEL PI INDICATOR 7.5 (GLOVE) ×2
GOWN STRL REUS W/TWL LRG LVL3 (GOWN DISPOSABLE) ×6 IMPLANT
GOWN STRL REUS W/TWL XL LVL3 (GOWN DISPOSABLE) ×6 IMPLANT
IRRIG SUCT STRYKERFLOW 2 WTIP (MISCELLANEOUS) ×3
IRRIGATION SUCT STRKRFLW 2 WTP (MISCELLANEOUS) ×1 IMPLANT
KIT BASIN OR (CUSTOM PROCEDURE TRAY) ×3 IMPLANT
LIQUID BAND (GAUZE/BANDAGES/DRESSINGS) ×3 IMPLANT
PAD POSITIONING PINK XL (MISCELLANEOUS) IMPLANT
POSITIONER SURGICAL ARM (MISCELLANEOUS) IMPLANT
POUCH RETRIEVAL ECOSAC 10 (ENDOMECHANICALS) ×1 IMPLANT
POUCH RETRIEVAL ECOSAC 10MM (ENDOMECHANICALS) ×2
SET CHOLANGIOGRAPH MIX (MISCELLANEOUS) IMPLANT
SLEEVE XCEL OPT CAN 5 100 (ENDOMECHANICALS) ×9 IMPLANT
STRIP CLOSURE SKIN 1/2X4 (GAUZE/BANDAGES/DRESSINGS) IMPLANT
SUT MNCRL AB 4-0 PS2 18 (SUTURE) ×6 IMPLANT
SUT VICRYL 0 UR6 27IN ABS (SUTURE) ×3 IMPLANT
TAPE CLOTH 4X10 WHT NS (GAUZE/BANDAGES/DRESSINGS) IMPLANT
TOWEL OR 17X26 10 PK STRL BLUE (TOWEL DISPOSABLE) ×3 IMPLANT
TOWEL OR NON WOVEN STRL DISP B (DISPOSABLE) ×3 IMPLANT
TRAY LAPAROSCOPIC (CUSTOM PROCEDURE TRAY) ×3 IMPLANT
TROCAR BLADELESS OPT 5 100 (ENDOMECHANICALS) ×3 IMPLANT
TROCAR XCEL BLUNT TIP 100MML (ENDOMECHANICALS) ×3 IMPLANT
TROCAR XCEL NON-BLD 11X100MML (ENDOMECHANICALS) IMPLANT
TUBING INSUF HEATED (TUBING) IMPLANT

## 2016-08-23 NOTE — Anesthesia Procedure Notes (Signed)
Procedure Name: Intubation Date/Time: 08/23/2016 9:49 AM Performed by: Lajuana Carry E Pre-anesthesia Checklist: Patient identified, Emergency Drugs available, Suction available and Patient being monitored Patient Re-evaluated:Patient Re-evaluated prior to inductionOxygen Delivery Method: Circle system utilized Preoxygenation: Pre-oxygenation with 100% oxygen Intubation Type: IV induction Ventilation: Mask ventilation without difficulty Laryngoscope Size: Mac and 3 Grade View: Grade I Tube type: Oral Tube size: 7.0 mm Number of attempts: 1 Airway Equipment and Method: Stylet Placement Confirmation: ETT inserted through vocal cords under direct vision,  positive ETCO2 and breath sounds checked- equal and bilateral Secured at: 21 cm Tube secured with: Tape Dental Injury: Teeth and Oropharynx as per pre-operative assessment

## 2016-08-23 NOTE — Transfer of Care (Signed)
Immediate Anesthesia Transfer of Care Note  Patient: Carrie [name]  Procedure(s) Performed: Procedure(s): LAPAROSCOPIC CHOLECYSTECTOMY (N/A)  Patient Location: PACU  Anesthesia Type:General  Level of Consciousness: awake, alert  and oriented  Airway & Oxygen Therapy: Patient Spontanous Breathing and Patient connected to face mask oxygen  Post-op Assessment: Report given to RN and Post -op Vital signs reviewed and stable  Post vital signs: Reviewed and stable  Last Vitals:  Vitals:   08/22/16 2139 08/23/16 0617  BP: 138/76 (!) 152/66  Pulse: 84 92  Resp: 18 18  Temp: 37.6 C 37.4 C    Last Pain:  Vitals:   08/23/16 0839  TempSrc:   PainSc: 8       Patients Stated Pain Goal: 1 ([id number])  Complications: No apparent anesthesia complications

## 2016-08-23 NOTE — Progress Notes (Signed)
Patient ID: Carrie [name], female   DOB: 01-21-1946, 70 y.o.   MRN: [id number] ruq pain overnight, npo, plan for lap chole today

## 2016-08-23 NOTE — Anesthesia Postprocedure Evaluation (Signed)
Anesthesia Post Note  Patient: Carrie [name]  Procedure(s) Performed: Procedure(s) (LRB): LAPAROSCOPIC CHOLECYSTECTOMY (N/A)  Patient location during evaluation: PACU Anesthesia Type: General Level of consciousness: awake and alert Pain management: pain level controlled Vital Signs Assessment: post-procedure vital signs reviewed and stable Respiratory status: spontaneous breathing, nonlabored ventilation, respiratory function stable and patient connected to nasal cannula oxygen Cardiovascular status: blood pressure returned to baseline and stable Postop Assessment: no signs of nausea or vomiting Anesthetic complications: no    Last Vitals:  Vitals:   08/23/16 1245 08/23/16 1315  BP: (!) 148/70 (!) 154/86  Pulse: 86 82  Resp: 13 17  Temp:  37.4 C    Last Pain:  Vitals:   08/23/16 1245  TempSrc:   PainSc: 3                  Catalina Gravel

## 2016-08-23 NOTE — Care Management Obs Status (Signed)
[place] NOTIFICATION   Patient Details  Name: Carrie [name] MRN: [id number] Date of Birth: Apr 25, 1946   Medicare Observation Status Notification Given:  Yes    Guadalupe Maple, RN 08/23/2016, 3:05 PM

## 2016-08-23 NOTE — Anesthesia Preprocedure Evaluation (Addendum)
Anesthesia Evaluation  Patient identified by MRN, date of birth, ID band Patient awake    Reviewed: Allergy & Precautions, NPO status , Patient's Chart, lab work & pertinent test results  Airway Mallampati: III  TM Distance: >3 FB Neck ROM: Full    Dental  (+) Dental Advisory Given, Upper Dentures, Partial Lower   Pulmonary former smoker,    Pulmonary exam normal breath sounds clear to auscultation       Cardiovascular (-) hypertension(-) angina(-) Past MI Normal cardiovascular exam+ dysrhythmias (palpitations)  Rhythm:Regular Rate:Normal     Neuro/Psych negative neurological ROS  negative psych ROS   GI/Hepatic GERD  Medicated,Biliary colic with stones Esophageal spasms   Endo/Other  Obesity   Renal/GU negative Renal ROS     Musculoskeletal  (+) Arthritis ,   Abdominal   Peds  Hematology negative hematology ROS (+)   Anesthesia Other Findings Day of surgery medications reviewed with the patient.  Reproductive/Obstetrics                            Anesthesia Physical Anesthesia Plan  ASA: II  Anesthesia Plan: General   Post-op Pain Management:    Induction: Intravenous and Rapid sequence  Airway Management Planned: Oral ETT  Additional Equipment:   Intra-op Plan:   Post-operative Plan: Extubation in OR  Informed Consent: I have reviewed the patients History and Physical, chart, labs and discussed the procedure including the risks, benefits and alternatives for the proposed anesthesia with the patient or authorized representative who has indicated his/her understanding and acceptance.   Dental advisory given  Plan Discussed with: CRNA  Anesthesia Plan Comments: (Risks/benefits of general anesthesia discussed with patient including risk of damage to teeth, lips, gum, and tongue, nausea/vomiting, allergic reactions to medications, and the possibility of heart attack, stroke  and death.  All patient questions answered.  Patient wishes to proceed.)       Anesthesia Quick Evaluation

## 2016-08-23 NOTE — Op Note (Signed)
Preoperative diagnosis: Acute cholecystitis Postoperative diagnosis: same as above Procedure: laparoscopic cholecystectomy Surgeon: Dr Serita Grammes Asst: Izora Gala Anesthesia: general EBL: minimal Drains none Specimen gb and contents to pathology Complications: none Sponge count correct at completion Disposition to recovery stable  Indications: This is a 55 yof with acute cholecystitis.  We discussed a laparoscopic cholecystectomy.  Procedure: After informed consent was obtained the patient was taken to the operating room. She was given antibiotics. Sequential compression devices were on her legs. She was placed under general anesthesia without complication. Her abdomen was prepped and draped in the standard sterile surgical fashion. A surgical timeout was then performed.  I infiltrated marcaine below the umbilicus.  I made a vertical incision and grasped the fascia. I then incised the fascia and entered the peritoneum bluntly.  I placed a 0 vicryl pursestring suture and inserted a hasson trocar. I then insufflated the abdomen to 15 mm Hg pressure.I then placed 3 additional 5 mm trocars in the epigastrium and the right upper quadrant. she had a fair amount of adhesions from her prior right colectomy.  I inserted two further left sided trocars and lysed the adhesions bluntly.   I then was able to grasp the gallbladder and retract it cephalad and lateral. She had a lot of inflammation in the triangle and clearly had acute cholecystitis as well.   I then obtained the critical view of safety. I clipped and divided the cystic artery but did so with two sets of clips treating the anterior and posterior branches of the artery. .I treated the cystic duct in a similar fashion. The clips traversed the duct. I then removed the gallbladder from the liver bed and placed it in a bag.  I then obtained hemostasis and irrigated. I removed the hasson trocar and tied the pursestring down. I placed an  additional 0 vicryl stitch at the umbilical incision with the endoclose device.  I then desufflated the abdomen and removed all my remaining trocars. I then closed these with 4-0 Monocryl and Dermabond. She tolerated this well was extubated and transferred to the recovery room in stable condition

## 2016-08-24 DIAGNOSIS — K8000 Calculus of gallbladder with acute cholecystitis without obstruction: Secondary | ICD-10-CM | POA: Diagnosis not present

## 2016-08-24 MED ORDER — HYDROCODONE-ACETAMINOPHEN 5-325 MG PO TABS: 1.0000 | ORAL_TABLET | ORAL | 0 refills | Status: DC | PRN

## 2016-08-24 MED ORDER — ONDANSETRON 4 MG PO TBDP: 4.0000 mg | ORAL_TABLET | Freq: Four times a day (QID) | ORAL | 0 refills | Status: DC | PRN

## 2016-08-24 MED ORDER — PANTOPRAZOLE SODIUM 40 MG PO TBEC: 40.0000 mg | DELAYED_RELEASE_TABLET | Freq: Two times a day (BID) | ORAL | Status: DC | PRN

## 2016-08-24 MED ORDER — PANTOPRAZOLE SODIUM 40 MG PO TBEC
40.0000 mg | DELAYED_RELEASE_TABLET | Freq: Every day | ORAL | Status: DC
  Administered 2016-08-24: 40 mg via ORAL
  Filled 2016-08-24: qty 1

## 2016-08-24 MED ORDER — METOCLOPRAMIDE HCL 5 MG PO TABS
5.0000 mg | ORAL_TABLET | Freq: Four times a day (QID) | ORAL | Status: DC | PRN
  Administered 2016-08-24: 5 mg via ORAL
  Filled 2016-08-24: qty 1

## 2016-08-24 NOTE — Progress Notes (Signed)
Patient with continued nausea despite zofran. PA brooke notified. Will try reglan.

## 2016-08-24 NOTE — Progress Notes (Signed)
Patient ID: Carrie [name], female   DOB: October 25, 1946, 70 y.o.   MRN: [id number]  [facility] Surgery Progress Note  1 Day Post-Op  Subjective: Had some nausea last night, feeling better this morning. Does not have much of an appetite. Not eating or drinking very much. Walked a little yesterday but not today. +flatus.  Objective: Vital signs in last 24 hours: Temp:  [98.7 F (37.1 C)-99.3 F (37.4 C)] 99 F (37.2 C) (09/20 0423) Pulse Rate:  [81-102] 82 (09/20 0423) Resp:  [8-18] 18 (09/20 0423) BP: (135-154)/(67-86) 146/67 (09/20 0423) SpO2:  [94 %-100 %] 97 % (09/20 0423) Last BM Date: 08/22/16  Intake/Output from previous day: 09/19 0701 - 09/20 0700 In: 1907.5 [I.V.:1857.5; IV Piggyback:50] Out: 1860 [id number]; Blood:10] Intake/Output this shift: No intake/output data recorded.  PE: Gen:  Alert, NAD, pleasant Abd: Soft, ND, +BS, appropriately tender, multiple lap incisions covered with steri strips and with no concern for infection  Lab Results:   Recent Labs  08/22/16 0854  WBC 7.8  HGB 12.8  HCT 39.7  PLT 248   BMET  Recent Labs  08/22/16 0854  NA 142  K 4.4  CL 108  CO2 28  GLUCOSE 116*  BUN 18  CREATININE 0.89  CALCIUM 9.4   PT/INR No results for input(s): LABPROT, INR in the last 72 hours. CMP     Component Value Date/Time   NA 142 08/22/2016 0854   K 4.4 08/22/2016 0854   CL 108 08/22/2016 0854   CO2 28 08/22/2016 0854   GLUCOSE 116 (H) 08/22/2016 0854   BUN 18 08/22/2016 0854   CREATININE 0.89 08/22/2016 0854   CALCIUM 9.4 08/22/2016 0854   PROT 7.1 08/22/2016 0854   PROT 6.7 06/15/2015 1302   ALBUMIN 4.0 08/22/2016 0854   AST 23 08/22/2016 0854   ALT 22 08/22/2016 0854   ALKPHOS 61 08/22/2016 0854   BILITOT 0.7 08/22/2016 0854   GFRNONAA >60 08/22/2016 0854   GFRAA >60 08/22/2016 0854   Lipase     Component Value Date/Time   LIPASE 23 08/22/2016 0854       Studies/Results: US Abdomen Limited Ruq  Result Date:  08/22/2016 CLINICAL DATA:  Acute right upper quadrant abdominal pain. EXAM: US ABDOMEN LIMITED - RIGHT UPPER QUADRANT COMPARISON:  None. FINDINGS: Gallbladder: Multiple gallstones are noted with the largest measuring 1.3 cm. No significant gallbladder wall thickening or pericholecystic fluid is noted. No sonographic Murphy's sign is noted. Common bile duct: Diameter: 3.3 mm which is within normal limits. Liver: No focal lesion identified. Increased echogenicity is noted consistent with fatty infiltration. IMPRESSION: Cholelithiasis is noted without gallbladder wall thickening or pericholecystic fluid. No sonographic Murphy's sign is noted. If there is clinical concern for cholecystitis, HIDA scan may be performed for further evaluation. Fatty infiltration of the liver. Electronically Signed   By: Marijo Conception, M.D.   On: 08/22/2016 10:25    Anti-infectives: Anti-infectives    Start     Dose/Rate Route Frequency Ordered Stop   08/22/16 1645  cefTRIAXone (ROCEPHIN) 2 g in dextrose 5 % 50 mL IVPB     2 g 100 mL/hr over 30 Minutes Intravenous Every 24 hours 08/22/16 1635         Assessment/Plan S/p laparoscopic cholecystectomy 08/23/16 Dr. Donne Hazel - POD 1 - some nausea - + flatus - has not tried to eat anything yet and only drinking few fluids  FEN - advance diet as tolerated VTE - lovenox, SCD's  Plan - advance diet as tolerated, encouraged more PO fluids. Mobilize. Will likely be ready for discharge later this morning.    LOS: 0 days    Jerrye Beavers , [facility] Surgery 08/24/2016, 7:34 AM Pager: [phone] Consults: [phone] Mon-Fri 7:00 am-4:30 pm Sat-Sun 7:00 am-11:30 am

## 2016-08-24 NOTE — Discharge Summary (Signed)
  [facility] Discharge Summary   Patient ID: Carrie [name] MRN: [id number] DOB/AGE: [birth year] 70 y.o.  Admit date: 08/22/2016 Discharge date: 08/25/2016  Admitting Diagnosis: Gallstones RUQ pain  Discharge Diagnosis Patient Active Problem List   Diagnosis Date Noted  . Symptomatic cholelithiasis 08/22/2016  . Colon polyp 11/24/2015  . Acid reflux 11/24/2015  . Osteoarthritis of spine with radiculopathy, lumbar region 11/24/2015  . Hereditary and idiopathic peripheral neuropathy 06/19/2015  . Osteopenia 05/28/2014  . Essential hypertension 03/05/2014  . Palpitations 12/26/2013  . Premature ventricular contractions 12/26/2013  . Adaptive colitis 03/27/2013  . Ex-smoker 03/06/2013  . Frequent UTI 03/06/2013  . Seborrhea capitis 03/06/2013  . Allergic rhinitis 01/03/2012  . Beat, premature ventricular 01/03/2012    Consultants None  Imaging: U/s abdomen limited RUQ 08/22/16: Cholelithiasis is noted without gallbladder wall thickening or pericholecystic fluid. No sonographic Murphy's sign is noted. If there is clinical concern for cholecystitis, HIDA scan may be performed for further evaluation. Fatty infiltration of the liver.  Procedures Dr. Donne Hazel (08/23/16) - Laparoscopic Cholecystectomy  Hospital Course:  Carrie [name] is a 70yo who presented to [facility] 08/23/16 with sudden onset RUQ abdominal pain with associated nausea.  Workup showed symptomatic cholelithiasis. Her LFT's and WBC were normal.  Patient was admitted and underwent procedure listed above.  Tolerated procedure well and was transferred to the floor.  She did have some issues with postoperative nausea, no vomiting. This improved with time and antiemetics. Diet was advanced as tolerated.  On POD2 the patient was voiding well, tolerating diet, ambulating well, pain well controlled, vital signs stable, incisions c/d/i and felt stable for discharge home.  Patient will follow up in our office in 3 weeks  and knows to call with questions or concerns.    Physical Exam: Gen:  Alert, NAD, pleasant Abd: Soft, ND, +BS, appropriately tender, multiple lap incisions covered with steri strips and with no concern for infection    Follow-up Information    WAKEFIELD,MATTHEW, MD. Go today.   Specialty:  General Surgery Why:  09/09/2016 at 11:00am. Please arrive 30 minutes prior to appointment to fill out necessary paperwork. Contact information: [address] [place] [address] [phone]           Signed: Jerrye Beavers, [facility] 08/24/2016, 1:50 PM Pager: [phone] Consults: [phone] Mon-Fri 7:00 am-4:30 pm Sat-Sun 7:00 am-11:30 am

## 2016-08-25 DIAGNOSIS — K8000 Calculus of gallbladder with acute cholecystitis without obstruction: Secondary | ICD-10-CM | POA: Diagnosis not present

## 2016-08-25 NOTE — Progress Notes (Signed)
2 Days Post-Op  Subjective: Doing much better, no more nausea, passing flatus  Objective: Vital signs in last 24 hours: Temp:  [98.5 F (36.9 C)-98.9 F (37.2 C)] 98.5 F (36.9 C) (09/21 0529) Pulse Rate:  [65-72] 65 (09/21 0529) Resp:  [16-18] 18 (09/21 0529) BP: (148-171)/(70-81) 171/72 (09/21 0529) SpO2:  [97 %-98 %] 97 % (09/21 0529) Last BM Date: 08/22/16  Intake/Output from previous day: 09/20 0701 - 09/21 0700 In: 960 [P.O.:360; I.V.:600] Out: -  Intake/Output this shift: No intake/output data recorded.  GI: soft approp tender incisions clean  Lab Results:   Recent Labs  08/22/16 0854  WBC 7.8  HGB 12.8  HCT 39.7  PLT 248   BMET  Recent Labs  08/22/16 0854  NA 142  K 4.4  CL 108  CO2 28  GLUCOSE 116*  BUN 18  CREATININE 0.89  CALCIUM 9.4   PT/INR No results for input(s): LABPROT, INR in the last 72 hours. ABG No results for input(s): PHART, HCO3 in the last 72 hours.  Invalid input(s): PCO2, PO2  Studies/Results: No results found.  Anti-infectives: Anti-infectives    Start     Dose/Rate Route Frequency Ordered Stop   08/22/16 1645  cefTRIAXone (ROCEPHIN) 2 g in dextrose 5 % 50 mL IVPB  Status:  Discontinued     2 g 100 mL/hr over 30 Minutes Intravenous Every 24 hours 08/22/16 1635 08/25/16 0739      Assessment/Plan: POD 2 lap chole  Much better, hopefully home today  [facility] 08/25/2016

## 2016-09-05 DIAGNOSIS — Z23 Encounter for immunization: Secondary | ICD-10-CM | POA: Diagnosis not present

## 2016-09-28 ENCOUNTER — Other Ambulatory Visit: Payer: Self-pay | Admitting: Family Medicine

## 2016-09-28 DIAGNOSIS — Z1231 Encounter for screening mammogram for malignant neoplasm of breast: Secondary | ICD-10-CM

## 2016-09-30 DIAGNOSIS — M4726 Other spondylosis with radiculopathy, lumbar region: Secondary | ICD-10-CM | POA: Diagnosis not present

## 2016-09-30 DIAGNOSIS — M5136 Other intervertebral disc degeneration, lumbar region: Secondary | ICD-10-CM | POA: Diagnosis not present

## 2016-09-30 DIAGNOSIS — M5440 Lumbago with sciatica, unspecified side: Secondary | ICD-10-CM | POA: Diagnosis not present

## 2016-09-30 DIAGNOSIS — Z6830 Body mass index (BMI) 30.0-30.9, adult: Secondary | ICD-10-CM | POA: Diagnosis not present

## 2016-10-20 DIAGNOSIS — J302 Other seasonal allergic rhinitis: Secondary | ICD-10-CM | POA: Diagnosis not present

## 2016-10-20 DIAGNOSIS — K219 Gastro-esophageal reflux disease without esophagitis: Secondary | ICD-10-CM | POA: Insufficient documentation

## 2016-10-20 DIAGNOSIS — J358 Other chronic diseases of tonsils and adenoids: Secondary | ICD-10-CM | POA: Diagnosis not present

## 2016-10-21 DIAGNOSIS — Z0000 Encounter for general adult medical examination without abnormal findings: Secondary | ICD-10-CM | POA: Diagnosis not present

## 2016-10-21 DIAGNOSIS — R7301 Impaired fasting glucose: Secondary | ICD-10-CM | POA: Insufficient documentation

## 2016-10-21 DIAGNOSIS — K219 Gastro-esophageal reflux disease without esophagitis: Secondary | ICD-10-CM | POA: Diagnosis not present

## 2016-11-15 ENCOUNTER — Ambulatory Visit
Admission: RE | Admit: 2016-11-15 | Discharge: 2016-11-15 | Disposition: A | Discharge status: Home / Self Care | Payer: Medicare Other | Source: Ambulatory Visit | Attending: Family Medicine | Admitting: Family Medicine

## 2016-11-15 DIAGNOSIS — Z1231 Encounter for screening mammogram for malignant neoplasm of breast: Secondary | ICD-10-CM | POA: Diagnosis not present

## 2016-12-13 DIAGNOSIS — L218 Other seborrheic dermatitis: Secondary | ICD-10-CM | POA: Diagnosis not present

## 2016-12-13 DIAGNOSIS — L650 Telogen effluvium: Secondary | ICD-10-CM | POA: Diagnosis not present

## 2016-12-13 DIAGNOSIS — Z85828 Personal history of other malignant neoplasm of skin: Secondary | ICD-10-CM | POA: Diagnosis not present

## 2016-12-13 DIAGNOSIS — Z79899 Other long term (current) drug therapy: Secondary | ICD-10-CM | POA: Diagnosis not present

## 2017-01-16 ENCOUNTER — Telehealth: Payer: Self-pay | Admitting: Internal Medicine

## 2017-01-16 DIAGNOSIS — Z0000 Encounter for general adult medical examination without abnormal findings: Secondary | ICD-10-CM

## 2017-01-16 DIAGNOSIS — I10 Essential (primary) hypertension: Secondary | ICD-10-CM

## 2017-01-16 NOTE — Telephone Encounter (Signed)
Left message for patient and would discuss with Dr Rayann Heman on Wed

## 2017-01-16 NOTE — Telephone Encounter (Signed)
New Message   Pt called wanting to know if she can schedule labs before her appt with Dr. Rayann Heman in April. No order in system.

## 2017-01-18 NOTE — Telephone Encounter (Signed)
Follow up    Pt returning [facility] call. Pt said April 4th would be good if she can do lab work on that day.

## 2017-01-18 NOTE — Telephone Encounter (Signed)
Discussed with Dr Rayann Heman will order a fasting lipid/bmp and cbc prior to her office visit.  I left a message for the patient to call me with a date she wants to come in for labs

## 2017-01-20 NOTE — Telephone Encounter (Signed)
Left message for patient to come fasting on 03/08/17 for labs

## 2017-01-23 DIAGNOSIS — J358 Other chronic diseases of tonsils and adenoids: Secondary | ICD-10-CM | POA: Diagnosis not present

## 2017-01-23 DIAGNOSIS — K219 Gastro-esophageal reflux disease without esophagitis: Secondary | ICD-10-CM | POA: Diagnosis not present

## 2017-02-20 DIAGNOSIS — L218 Other seborrheic dermatitis: Secondary | ICD-10-CM | POA: Diagnosis not present

## 2017-02-20 DIAGNOSIS — Z85828 Personal history of other malignant neoplasm of skin: Secondary | ICD-10-CM | POA: Diagnosis not present

## 2017-02-24 DIAGNOSIS — H04123 Dry eye syndrome of bilateral lacrimal glands: Secondary | ICD-10-CM | POA: Diagnosis not present

## 2017-02-24 DIAGNOSIS — H524 Presbyopia: Secondary | ICD-10-CM | POA: Diagnosis not present

## 2017-02-24 DIAGNOSIS — H2513 Age-related nuclear cataract, bilateral: Secondary | ICD-10-CM | POA: Diagnosis not present

## 2017-02-24 DIAGNOSIS — H40013 Open angle with borderline findings, low risk, bilateral: Secondary | ICD-10-CM | POA: Diagnosis not present

## 2017-02-24 DIAGNOSIS — H1851 Endothelial corneal dystrophy: Secondary | ICD-10-CM | POA: Diagnosis not present

## 2017-03-08 ENCOUNTER — Encounter (INDEPENDENT_AMBULATORY_CARE_PROVIDER_SITE_OTHER): Payer: Self-pay

## 2017-03-08 ENCOUNTER — Other Ambulatory Visit: Payer: Medicare Other | Admitting: *Deleted

## 2017-03-08 DIAGNOSIS — I10 Essential (primary) hypertension: Secondary | ICD-10-CM

## 2017-03-08 DIAGNOSIS — Z0000 Encounter for general adult medical examination without abnormal findings: Secondary | ICD-10-CM | POA: Diagnosis not present

## 2017-03-08 LAB — CBC WITH DIFFERENTIAL/PLATELET
Basophils Absolute: 0 10*3/uL (ref 0.0–0.2)
Basos: 0 %
EOS (ABSOLUTE): 0.2 10*3/uL (ref 0.0–0.4)
Eos: 3 %
Hematocrit: 37.6 % (ref 34.0–46.6)
Hemoglobin: 12.1 g/dL (ref 11.1–15.9)
Immature Grans (Abs): 0 10*3/uL (ref 0.0–0.1)
Immature Granulocytes: 0 %
Lymphocytes Absolute: 1.7 10*3/uL (ref 0.7–3.1)
Lymphs: 33 %
MCH: 27.8 pg (ref 26.6–33.0)
MCHC: 32.2 g/dL (ref 31.5–35.7)
MCV: 86 fL (ref 79–97)
Monocytes Absolute: 0.5 10*3/uL (ref 0.1–0.9)
Monocytes: 10 %
Neutrophils Absolute: 2.8 10*3/uL (ref 1.4–7.0)
Neutrophils: 54 %
Platelets: 226 10*3/uL (ref 150–379)
RBC: 4.36 x10E6/uL (ref 3.77–5.28)
RDW: 13.4 % (ref 12.3–15.4)
WBC: 5.3 10*3/uL (ref 3.4–10.8)

## 2017-03-08 LAB — LIPID PANEL
Chol/HDL Ratio: 2.6 ratio (ref 0.0–4.4)
Cholesterol, Total: 187 mg/dL (ref 100–199)
HDL: 72 mg/dL (ref >39)
LDL Calculated: 94 mg/dL (ref 0–99)
Triglycerides: 105 mg/dL (ref 0–149)
VLDL Cholesterol Cal: 21 mg/dL (ref 5–40)

## 2017-03-08 LAB — BASIC METABOLIC PANEL
BUN/Creatinine Ratio: 19 (ref 12–28)
BUN: 18 mg/dL (ref 8–27)
CO2: 26 mmol/L (ref 18–29)
Calcium: 9.2 mg/dL (ref 8.7–10.3)
Chloride: 101 mmol/L (ref 96–106)
Creatinine, Ser: 0.96 mg/dL (ref 0.57–1.00)
GFR calc Af Amer: 68 mL/min/{1.73_m2} (ref >59)
GFR calc non Af Amer: 59 mL/min/{1.73_m2} — ABNORMAL LOW (ref >59)
Glucose: 98 mg/dL (ref 65–99)
Potassium: 4.3 mmol/L (ref 3.5–5.2)
Sodium: 142 mmol/L (ref 134–144)

## 2017-03-13 ENCOUNTER — Ambulatory Visit (INDEPENDENT_AMBULATORY_CARE_PROVIDER_SITE_OTHER): Payer: Medicare Other | Admitting: Internal Medicine

## 2017-03-13 ENCOUNTER — Encounter: Payer: Self-pay | Admitting: Internal Medicine

## 2017-03-13 VITALS — BP 148/86 | HR 80 | Ht 63.5 in | Wt 180.4 lb

## 2017-03-13 DIAGNOSIS — R002 Palpitations: Secondary | ICD-10-CM | POA: Diagnosis not present

## 2017-03-13 DIAGNOSIS — I493 Ventricular premature depolarization: Secondary | ICD-10-CM

## 2017-03-13 NOTE — Patient Instructions (Addendum)
Medication Instructions:  Your physician recommends that you continue on your current medications as directed. Please refer to the Current Medication list given to you today.   Labwork: None ordered   Testing/Procedures: None ordered   Follow-Up: Your physician wants you to follow-up in: 12 months with Dr Rayann Heman Dennis Bast will receive a reminder letter in the mail two months in advance. If you don't receive a letter, please call our office to schedule the follow-up appointment.   Any Other Special Instructions Will Be Listed Below (If Applicable).   Low-Sodium Eating Plan Sodium, which is an element that makes up salt, helps you maintain a healthy balance of fluids in your body. Too much sodium can increase your blood pressure and cause fluid and waste to be held in your body. Your health care provider or dietitian may recommend following this plan if you have high blood pressure (hypertension), kidney disease, liver disease, or heart failure. Eating less sodium can help lower your blood pressure, reduce swelling, and protect your heart, liver, and kidneys. What are tips for following this plan? General guidelines   Most people on this plan should limit their sodium intake to 1,500-2,000 mg (milligrams) of sodium each day. Reading food labels   The Nutrition Facts label lists the amount of sodium in one serving of the food. If you eat more than one serving, you must multiply the listed amount of sodium by the number of servings.  Choose foods with less than 140 mg of sodium per serving.  Avoid foods with 300 mg of sodium or more per serving. Shopping   Look for lower-sodium products, often labeled as "low-sodium" or "no salt added."  Always check the sodium content even if foods are labeled as "unsalted" or "no salt added".  Buy fresh foods.  Avoid canned foods and premade or frozen meals.  Avoid canned, cured, or processed meats  Buy breads that have less than 80 mg of sodium  per slice. Cooking   Eat more home-cooked food and less restaurant, buffet, and fast food.  Avoid adding salt when cooking. Use salt-free seasonings or herbs instead of table salt or sea salt. Check with your health care provider or pharmacist before using salt substitutes.  Cook with plant-based oils, such as canola, sunflower, or olive oil. Meal planning   When eating at a restaurant, ask that your food be prepared with less salt or no salt, if possible.  Avoid foods that contain MSG (monosodium glutamate). MSG is sometimes added to [place] food, bouillon, and some canned foods. What foods are recommended? The items listed may not be a complete list. Talk with your dietitian about what dietary choices are best for you. Grains  Low-sodium cereals, including oats, puffed wheat and rice, and shredded wheat. Low-sodium crackers. Unsalted rice. Unsalted pasta. Low-sodium bread. Whole-grain breads and whole-grain pasta. Vegetables  Fresh or frozen vegetables. "No salt added" canned vegetables. "No salt added" tomato sauce and paste. Low-sodium or reduced-sodium tomato and vegetable juice. Fruits  Fresh, frozen, or canned fruit. Fruit juice. Meats and other protein foods  Fresh or frozen (no salt added) meat, poultry, seafood, and fish. Low-sodium canned tuna and salmon. Unsalted nuts. Dried peas, beans, and lentils without added salt. Unsalted canned beans. Eggs. Unsalted nut butters. Dairy  Milk. Soy milk. Cheese that is naturally low in sodium, such as ricotta cheese, fresh mozzarella, or Swiss cheese Low-sodium or reduced-sodium cheese. Cream cheese. Yogurt. Fats and oils  Unsalted butter. Unsalted margarine with no trans fat.  Vegetable oils such as canola or olive oils. Seasonings and other foods  Fresh and dried herbs and spices. Salt-free seasonings. Low-sodium mustard and ketchup. Sodium-free salad dressing. Sodium-free light mayonnaise. Fresh or refrigerated horseradish. Lemon juice.  Vinegar. Homemade, reduced-sodium, or low-sodium soups. Unsalted popcorn and pretzels. Low-salt or salt-free chips. What foods are not recommended? The items listed may not be a complete list. Talk with your dietitian about what dietary choices are best for you. Grains  Instant hot cereals. Bread stuffing, pancake, and biscuit mixes. Croutons. Seasoned rice or pasta mixes. Noodle soup cups. Boxed or frozen macaroni and cheese. Regular salted crackers. Self-rising flour. Vegetables  Sauerkraut, pickled vegetables, and relishes. Olives. [place] fries. Onion rings. Regular canned vegetables (not low-sodium or reduced-sodium). Regular canned tomato sauce and paste (not low-sodium or reduced-sodium). Regular tomato and vegetable juice (not low-sodium or reduced-sodium). Frozen vegetables in sauces. Meats and other protein foods  Meat or fish that is salted, canned, smoked, spiced, or pickled. Bacon, ham, sausage, hotdogs, corned beef, chipped beef, packaged lunch meats, salt pork, jerky, pickled herring, anchovies, regular canned tuna, sardines, salted nuts. Dairy  Processed cheese and cheese spreads. Cheese curds. Blue cheese. Feta cheese. String cheese. Regular cottage cheese. Buttermilk. Canned milk. Fats and oils  Salted butter. Regular margarine. Ghee. Bacon fat. Seasonings and other foods  Onion salt, garlic salt, seasoned salt, table salt, and sea salt. Canned and packaged gravies. Worcestershire sauce. Tartar sauce. Barbecue sauce. Teriyaki sauce. Soy sauce, including reduced-sodium. Steak sauce. Fish sauce. Oyster sauce. Cocktail sauce. Horseradish that you find on the shelf. Regular ketchup and mustard. Meat flavorings and tenderizers. Bouillon cubes. Hot sauce and Tabasco sauce. Premade or packaged marinades. Premade or packaged taco seasonings. Relishes. Regular salad dressings. Salsa. Potato and tortilla chips. Corn chips and puffs. Salted popcorn and pretzels. Canned or dried soups. Pizza.  Frozen entrees and pot pies. Summary  Eating less sodium can help lower your blood pressure, reduce swelling, and protect your heart, liver, and kidneys.  Most people on this plan should limit their sodium intake to 1,500-2,000 mg (milligrams) of sodium each day.  Canned, boxed, and frozen foods are high in sodium. Restaurant foods, fast foods, and pizza are also very high in sodium. You also get sodium by adding salt to food.  Try to cook at home, eat more fresh fruits and vegetables, and eat less fast food, canned, processed, or prepared foods. This information is not intended to replace advice given to you by your health care provider. Make sure you discuss any questions you have with your health care provider. Document Released: 05/13/2002 Document Revised: 11/14/2016 Document Reviewed: 11/14/2016 Elsevier Interactive Patient Education  2017 [employer].      If you need a refill on your cardiac medications before your next appointment, please call your pharmacy.

## 2017-03-13 NOTE — Progress Notes (Signed)
Electrophysiology Office Note   Date:  03/13/2017   ID:  Carrie [name], DOB [birth year], MRN [id number]  PCP:  Leamon Arnt, MD   Primary Electrophysiologist: Thompson Grayer, MD    Chief Complaint  Patient presents with  . Palpitations     History of Present Illness: Carrie [name] is a 71 y.o. female who presents today for electrophysiology evaluation.   She has done well over the past year.  Her PVCs are well controlled with norpace.  She has rare palpitations.  She had gall bladder surgery earlier this past year.   Today, she denies symptoms of chest pain, shortness of breath, orthopnea, PND, lower extremity edema, claudication, dizziness, presyncope, syncope, bleeding, or neurologic sequela. The patient is tolerating medications without difficulties and is otherwise without complaint today.    Past Medical History:  Diagnosis Date  . Colon polyps   . Esophageal spasm   . Fatigue   . Frequent PVCs   . GERD (gastroesophageal reflux disease)   . IBS (irritable bowel syndrome)   . Palpitations   . Recurrent UTI   . Seborrheic dermatitis of scalp 03/06/13  . Vitamin B 12 deficiency    Past Surgical History:  Procedure Laterality Date  . CHOLECYSTECTOMY N/A 08/23/2016   Procedure: LAPAROSCOPIC CHOLECYSTECTOMY;  Surgeon: Rolm Bookbinder, MD;  Location: [facility];  Service: General;  Laterality: N/A;  . COLECTOMY Right 1998   large colonic polyp  . ENDOSCOPIC VEIN LASER TREATMENT Bilateral 2010-2011  . [place]  2001  . SPINE SURGERY     3 since 11/2010, laminoectomy, lumbar fusion, diskectomy     Current Outpatient Prescriptions  Medication Sig Dispense Refill  . betamethasone dipropionate 4.09 % lotion 1 application topical daily    . calcium carbonate (TUMS CALCIUM FOR LIFE BONE) 750 MG chewable tablet Chew 1 tablet by mouth daily.     . Cholecalciferol (VITAMIN D3) 2000 units TABS Take 1 tablet by mouth daily.    . Ciclopirox 1 % shampoo Apply 1 application  topically daily as needed (itching).     Marland Kitchen diclofenac sodium (VOLTAREN) 1 % GEL Apply 2 g topically as directed. 100 Tube 11  . fexofenadine (ALLEGRA) 180 MG tablet Take 180 mg by mouth as needed for allergies or rhinitis.     . fluticasone (FLONASE) 50 MCG/ACT nasal spray Place 2 sprays into the nose daily as needed for allergies or rhinitis.     . hyoscyamine (LEVSIN/SL) 0.125 MG SL tablet Take 0.125 mg by mouth every 6 (six) hours as needed for cramping.     . Multiple Vitamin (MULTIVITAMIN WITH MINERALS) TABS tablet Take 1 tablet by mouth daily.    . NORPACE CR 150 MG 12 hr capsule Take 1 capsule (150 mg total) by mouth 2 (two) times daily. 180 capsule 3  . pantoprazole (PROTONIX) 40 MG tablet Take 40 mg by mouth daily.    Vladimir Faster Glycol-Propyl Glycol (SYSTANE OP) Apply 1 drop to eye 2 (two) times daily.    . Probiotic Product (PROBIOTIC PO) Take 1 capsule by mouth daily.    Marland Kitchen tretinoin (RETIN-A) 0.025 % cream Apply 1 application topically every other day.  0  . trimethoprim (TRIMPEX) 100 MG tablet as needed. One pill after sex may repeat in 12 hours and if symptoms are present may take one twice daily for three days    . HYDROcodone-acetaminophen (NORCO/VICODIN) 5-325 MG tablet Take 1 tablet by mouth every 4 (four) hours as needed for moderate  pain. (Patient not taking: Reported on 03/13/2017) 30 tablet 0  . ondansetron (ZOFRAN-ODT) 4 MG disintegrating tablet Take 1 tablet (4 mg total) by mouth every 6 (six) hours as needed for nausea. (Patient not taking: Reported on 03/13/2017) 20 tablet 0   No current facility-administered medications for this visit.     Allergies:   Phenergan [promethazine hcl]; Azithromycin; Epinephrine; Erythromycin; and Nitrofurantoin   Social History:  The patient  reports that she quit smoking about 4 years ago. She has never used smokeless tobacco. She reports that she drinks alcohol. She reports that she does not use drugs.   Family History:  The patient's   family history includes Cancer in her father and sister; Hypertension in her mother, sister, sister, sister, and sister.    ROS:  Please see the history of present illness.   All other systems are reviewed and negative.    PHYSICAL EXAM: VS:  BP (!) 148/86   Pulse 80   Ht 5' 3.5" (1.613 m)   Wt 180 lb 6.4 oz (81.8 kg)   SpO2 98%   BMI 31.46 kg/m  , BMI Body mass index is 31.46 kg/m. GEN: Well nourished, well developed, in no acute distress  HEENT: normal  Neck: no JVD  Cardiac: RRR; no murmurs, rubs, or gallops,no edema  Respiratory:  clear to auscultation bilaterally, normal work of breathing GI: soft, nontender, nondistended, + BS MS: no deformity or atrophy  Skin: warm and dry  Neuro:  Strength and sensation are intact Psych: euthymic mood, full affect  EKG:  EKG is ordered today. The ekg ordered today shows sinus rhythm 80 bpm, PR 170 msec, borderline incomplete LBBB which appears stable when compared to prior ekgs QRS 112 msec    Wt Readings from Last 3 Encounters:  03/13/17 180 lb 6.4 oz (81.8 kg)  08/22/16 176 lb 2.4 oz (79.9 kg)  03/02/16 172 lb 3.2 oz (78.1 kg)    Other studies Reviewed: Additional studies/ records that were reviewed today include: echo from 2/15 is reviewed   ASSESSMENT AND PLAN:  1. Palpitations/ pvcs Well controlled with norpace CR 150mg  daily (rarely takes bid).  No further workup planned at this time  2. htn Stable No change required today Journal from home is reviewed and reveals pretty good BP control off of medicine  Lipids, bmet, cbc reviewed with the patient today   Return to see me in 1 year    Current medicines are reviewed at length with the patient today.   The patient does not have concerns regarding her medicines.  The following changes were made today:  none   Signed, Thompson Grayer, MD  03/13/2017 12:33 PM     [address] [place] [id number] [phone]  (office) [phone] (fax)

## 2017-03-15 DIAGNOSIS — M170 Bilateral primary osteoarthritis of knee: Secondary | ICD-10-CM | POA: Diagnosis not present

## 2017-03-31 DIAGNOSIS — M170 Bilateral primary osteoarthritis of knee: Secondary | ICD-10-CM | POA: Diagnosis not present

## 2017-05-04 ENCOUNTER — Telehealth: Payer: Self-pay | Admitting: Internal Medicine

## 2017-05-04 NOTE — Telephone Encounter (Signed)
New message    Pt is calling stating she was having increasing heart irregularity. She said she has been out of the country for a while and she wasn't sure what to do so she began taking an extra Norpace each day. She is calling to find out if this is ok because it seems to be helping.

## 2017-05-04 NOTE — Telephone Encounter (Signed)
Returned call to the patient- starting the end of April she was having more irregularities.  Says her heart was skipping and beating fast.  At times almost felt faint.  She increased her Norpace to bid as they were going out of the country. This seemed to subside symptoms.  They returned home last night and she is calling today for recommendation.  Do you want her to continue twice daily?  She is feeling good now

## 2017-05-08 NOTE — Telephone Encounter (Signed)
Discussed with Dr Rayann Heman and he says this is okay for now but would like for her to decrease back down to once daily.  I have left a message for patient to return my call to discuss.

## 2017-05-08 NOTE — Telephone Encounter (Signed)
Follow up      Calling to let the nurse know that she "got the message".  You do not have to call her back

## 2017-05-26 DIAGNOSIS — L858 Other specified epidermal thickening: Secondary | ICD-10-CM | POA: Diagnosis not present

## 2017-05-26 DIAGNOSIS — Z85828 Personal history of other malignant neoplasm of skin: Secondary | ICD-10-CM | POA: Diagnosis not present

## 2017-06-22 ENCOUNTER — Other Ambulatory Visit: Payer: Self-pay | Admitting: Internal Medicine

## 2017-06-30 DIAGNOSIS — M1712 Unilateral primary osteoarthritis, left knee: Secondary | ICD-10-CM | POA: Diagnosis not present

## 2017-07-18 DIAGNOSIS — K219 Gastro-esophageal reflux disease without esophagitis: Secondary | ICD-10-CM | POA: Diagnosis not present

## 2017-07-18 DIAGNOSIS — J358 Other chronic diseases of tonsils and adenoids: Secondary | ICD-10-CM | POA: Diagnosis not present

## 2017-07-24 ENCOUNTER — Ambulatory Visit (INDEPENDENT_AMBULATORY_CARE_PROVIDER_SITE_OTHER): Payer: Medicare Other | Admitting: Internal Medicine

## 2017-07-24 ENCOUNTER — Encounter: Payer: Self-pay | Admitting: Internal Medicine

## 2017-07-24 VITALS — BP 142/80 | HR 87 | Temp 98.8°F | Resp 16 | Ht 63.5 in | Wt 188.9 lb

## 2017-07-24 DIAGNOSIS — I10 Essential (primary) hypertension: Secondary | ICD-10-CM | POA: Diagnosis not present

## 2017-07-24 DIAGNOSIS — E2839 Other primary ovarian failure: Secondary | ICD-10-CM | POA: Diagnosis not present

## 2017-07-24 NOTE — Progress Notes (Signed)
Subjective:  Patient ID: Carrie [name], female    DOB: 1946/04/13  Age: 71 y.o. MRN: [id number]  CC: Hypertension  NEW TO ME  HPI HODA HON presents for establishing as a new pt. She feels well and offers no complaints.  History Camrin has a past medical history of Colon polyps; Esophageal spasm; Fatigue; Frequent PVCs; GERD (gastroesophageal reflux disease); IBS (irritable bowel syndrome); Palpitations; Recurrent UTI; Seborrheic dermatitis of scalp (03/06/13); and Vitamin B 12 deficiency.   She has a past surgical history that includes Colectomy (Right, 1998); Rectocele repair (2001); Spine surgery; Endoscopic vein laser treatment (Bilateral, 2010-2011); and Cholecystectomy (N/A, 08/23/2016).   Her family history includes Cancer in her father and sister; Hypertension in her mother, sister, sister, and sister.She reports that she quit smoking about 4 years ago. She has never used smokeless tobacco. She reports that she drinks about 1.8 oz of alcohol per week . She reports that she does not use drugs.  Outpatient Medications Prior to Visit  Medication Sig Dispense Refill  . betamethasone dipropionate 9.52 % lotion 1 application topical daily    . calcium carbonate (TUMS CALCIUM FOR LIFE BONE) 750 MG chewable tablet Chew 1 tablet by mouth daily.     . Cholecalciferol (VITAMIN D3) 2000 units TABS Take 1 tablet by mouth daily.    . Ciclopirox 1 % shampoo Apply 1 application topically daily as needed (itching).     . fexofenadine (ALLEGRA) 180 MG tablet Take 180 mg by mouth as needed for allergies or rhinitis.     . fluticasone (FLONASE) 50 MCG/ACT nasal spray Place 2 sprays into the nose daily as needed for allergies or rhinitis.     . hyoscyamine (LEVSIN/SL) 0.125 MG SL tablet Take 0.125 mg by mouth every 6 (six) hours as needed for cramping.     . NORPACE CR 150 MG 12 hr capsule take 1 capsule by mouth twice a day 180 capsule 2  . pantoprazole (PROTONIX) 40 MG tablet Take 40 mg by mouth  daily.    Vladimir Faster Glycol-Propyl Glycol (SYSTANE OP) Apply 1 drop to eye 2 (two) times daily.    . Probiotic Product (PROBIOTIC PO) Take 1 capsule by mouth daily.    Marland Kitchen tretinoin (RETIN-A) 0.025 % cream Apply 1 application topically every other day.  0  . trimethoprim (TRIMPEX) 100 MG tablet as needed. One pill after sex may repeat in 12 hours and if symptoms are present may take one twice daily for three days    . Multiple Vitamin (MULTIVITAMIN WITH MINERALS) TABS tablet Take 1 tablet by mouth daily.    . diclofenac sodium (VOLTAREN) 1 % GEL Apply 2 g topically as directed. 100 Tube 11  . HYDROcodone-acetaminophen (NORCO/VICODIN) 5-325 MG tablet Take 1 tablet by mouth every 4 (four) hours as needed for moderate pain. (Patient not taking: Reported on 03/13/2017) 30 tablet 0  . ondansetron (ZOFRAN-ODT) 4 MG disintegrating tablet Take 1 tablet (4 mg total) by mouth every 6 (six) hours as needed for nausea. (Patient not taking: Reported on 03/13/2017) 20 tablet 0   No facility-administered medications prior to visit.     ROS Review of Systems  Constitutional: Negative.  Negative for activity change, appetite change, diaphoresis, fatigue and unexpected weight change.  HENT: Negative.  Negative for trouble swallowing.   Eyes: Negative for visual disturbance.  Respiratory: Negative for cough, chest tightness, shortness of breath and wheezing.   Cardiovascular: Negative.  Negative for chest pain, palpitations and leg  swelling.  Gastrointestinal: Negative for abdominal pain, constipation, diarrhea, nausea and vomiting.  Endocrine: Negative.   Genitourinary: Negative.   Musculoskeletal: Negative.   Skin: Negative.   Allergic/Immunologic: Negative.   Neurological: Negative.  Negative for dizziness and light-headedness.  Hematological: Negative.  Negative for adenopathy. Does not bruise/bleed easily.  Psychiatric/Behavioral: Negative.     Objective:  BP (!) 142/80 (BP Location: Left Arm, Patient  Position: Sitting, Cuff Size: Normal)   Pulse 87   Temp 98.8 F (37.1 C) (Oral)   Resp 16   Ht 5' 3.5" (1.613 m)   Wt 188 lb 14 oz (85.7 kg)   SpO2 100%   BMI 32.93 kg/m   Physical Exam  Constitutional: She is oriented to person, place, and time. No distress.  HENT:  Mouth/Throat: Oropharynx is clear and moist. No oropharyngeal exudate.  Eyes: Conjunctivae are normal. Right eye exhibits no discharge. Left eye exhibits no discharge. No scleral icterus.  Neck: Normal range of motion. Neck supple. No JVD present. No thyromegaly present.  Cardiovascular: Normal rate, regular rhythm and intact distal pulses.  Exam reveals no gallop and no friction rub.   No murmur heard. Pulmonary/Chest: Effort normal and breath sounds normal. No respiratory distress. She has no wheezes. She has no rales. She exhibits no tenderness.  Abdominal: Soft. Bowel sounds are normal. She exhibits no distension and no mass. There is no tenderness. There is no rebound and no guarding.  Musculoskeletal: Normal range of motion. She exhibits no edema, tenderness or deformity.  Lymphadenopathy:    She has no cervical adenopathy.  Neurological: She is alert and oriented to person, place, and time.  Skin: Skin is warm and dry. No rash noted. She is not diaphoretic. No erythema. No pallor.  Psychiatric: She has a normal mood and affect. Her behavior is normal. Judgment and thought content normal.  Vitals reviewed.   Lab Results  Component Value Date   WBC 5.3 03/08/2017   HGB 12.1 03/08/2017   HCT 37.6 03/08/2017   PLT 226 03/08/2017   GLUCOSE 98 03/08/2017   CHOL 187 03/08/2017   TRIG 105 03/08/2017   HDL 72 03/08/2017   LDLCALC 94 03/08/2017   ALT 22 08/22/2016   AST 23 08/22/2016   NA 142 03/08/2017   K 4.3 03/08/2017   CL 101 03/08/2017   CREATININE 0.96 03/08/2017   BUN 18 03/08/2017   CO2 26 03/08/2017   TSH 1.370 06/15/2015    Assessment & Plan:   Chandy was seen today for  hypertension.  Diagnoses and all orders for this visit:  Essential hypertension- her BP is adequately well controlled  Estrogen deficiency- she is due for a DEXA scan -     DG Bone Density; Future   I have discontinued Ms. Brands's diclofenac sodium, multivitamin with minerals, HYDROcodone-acetaminophen, and ondansetron. I am also having her maintain her fexofenadine, hyoscyamine, trimethoprim, calcium carbonate, Ciclopirox, fluticasone, betamethasone dipropionate, pantoprazole, tretinoin, Polyethyl Glycol-Propyl Glycol (SYSTANE OP), Vitamin D3, Probiotic Product (PROBIOTIC PO), NORPACE CR, and ranitidine.  Meds ordered this encounter  Medications  . ranitidine (ZANTAC) 150 MG tablet    Sig: Take by mouth.     Follow-up: No Follow-up on file.  Scarlette Calico, MD

## 2017-07-25 ENCOUNTER — Encounter: Payer: Self-pay | Admitting: Internal Medicine

## 2017-07-25 DIAGNOSIS — E2839 Other primary ovarian failure: Secondary | ICD-10-CM | POA: Insufficient documentation

## 2017-07-25 NOTE — Patient Instructions (Signed)

## 2017-08-11 DIAGNOSIS — M1712 Unilateral primary osteoarthritis, left knee: Secondary | ICD-10-CM | POA: Diagnosis not present

## 2017-08-15 DIAGNOSIS — D692 Other nonthrombocytopenic purpura: Secondary | ICD-10-CM | POA: Diagnosis not present

## 2017-08-15 DIAGNOSIS — Z85828 Personal history of other malignant neoplasm of skin: Secondary | ICD-10-CM | POA: Diagnosis not present

## 2017-08-15 DIAGNOSIS — L821 Other seborrheic keratosis: Secondary | ICD-10-CM | POA: Diagnosis not present

## 2017-08-15 DIAGNOSIS — L858 Other specified epidermal thickening: Secondary | ICD-10-CM | POA: Diagnosis not present

## 2017-08-15 DIAGNOSIS — D1801 Hemangioma of skin and subcutaneous tissue: Secondary | ICD-10-CM | POA: Diagnosis not present

## 2017-08-25 DIAGNOSIS — M1712 Unilateral primary osteoarthritis, left knee: Secondary | ICD-10-CM | POA: Diagnosis not present

## 2017-09-15 DIAGNOSIS — M1712 Unilateral primary osteoarthritis, left knee: Secondary | ICD-10-CM | POA: Diagnosis not present

## 2017-09-19 ENCOUNTER — Ambulatory Visit (INDEPENDENT_AMBULATORY_CARE_PROVIDER_SITE_OTHER)
Admission: RE | Admit: 2017-09-19 | Discharge: 2017-09-19 | Disposition: A | Discharge status: Home / Self Care | Payer: Medicare Other | Source: Ambulatory Visit | Attending: Internal Medicine | Admitting: Internal Medicine

## 2017-09-19 DIAGNOSIS — E2839 Other primary ovarian failure: Secondary | ICD-10-CM

## 2017-09-21 ENCOUNTER — Encounter: Payer: Self-pay | Admitting: Internal Medicine

## 2017-09-21 ENCOUNTER — Ambulatory Visit (INDEPENDENT_AMBULATORY_CARE_PROVIDER_SITE_OTHER): Payer: Medicare Other | Admitting: Internal Medicine

## 2017-09-21 ENCOUNTER — Other Ambulatory Visit (INDEPENDENT_AMBULATORY_CARE_PROVIDER_SITE_OTHER): Payer: Medicare Other

## 2017-09-21 VITALS — BP 144/84 | HR 81 | Temp 97.8°F | Resp 16 | Ht 63.5 in | Wt 182.0 lb

## 2017-09-21 DIAGNOSIS — Z0000 Encounter for general adult medical examination without abnormal findings: Secondary | ICD-10-CM | POA: Diagnosis not present

## 2017-09-21 DIAGNOSIS — Z23 Encounter for immunization: Secondary | ICD-10-CM

## 2017-09-21 DIAGNOSIS — K219 Gastro-esophageal reflux disease without esophagitis: Secondary | ICD-10-CM | POA: Diagnosis not present

## 2017-09-21 DIAGNOSIS — I493 Ventricular premature depolarization: Secondary | ICD-10-CM

## 2017-09-21 DIAGNOSIS — R8281 Pyuria: Secondary | ICD-10-CM

## 2017-09-21 DIAGNOSIS — N390 Urinary tract infection, site not specified: Secondary | ICD-10-CM | POA: Diagnosis not present

## 2017-09-21 DIAGNOSIS — I10 Essential (primary) hypertension: Secondary | ICD-10-CM

## 2017-09-21 DIAGNOSIS — N8110 Cystocele, unspecified: Secondary | ICD-10-CM | POA: Diagnosis not present

## 2017-09-21 LAB — THYROID PANEL WITH TSH
Free Thyroxine Index: 2.5 (ref 1.4–3.8)
T3 Uptake: 26 % (ref 22–35)
T4, Total: 9.5 ug/dL (ref 5.1–11.9)
TSH: 1.58 mIU/L (ref 0.40–4.50)

## 2017-09-21 LAB — BASIC METABOLIC PANEL
BUN: 19 mg/dL (ref 6–23)
CO2: 29 mEq/L (ref 19–32)
Calcium: 9.4 mg/dL (ref 8.4–10.5)
Chloride: 104 mEq/L (ref 96–112)
Creatinine, Ser: 1.05 mg/dL (ref 0.40–1.20)
GFR: 54.67 mL/min — ABNORMAL LOW (ref >60.00)
Glucose, Bld: 104 mg/dL — ABNORMAL HIGH (ref 70–99)
Potassium: 4.5 mEq/L (ref 3.5–5.1)
Sodium: 141 mEq/L (ref 135–145)

## 2017-09-21 LAB — URINALYSIS, ROUTINE W REFLEX MICROSCOPIC
Bilirubin Urine: NEGATIVE
Hgb urine dipstick: NEGATIVE
Ketones, ur: NEGATIVE
Nitrite: NEGATIVE
RBC / HPF: NONE SEEN (ref 0–?)
Specific Gravity, Urine: 1.02 (ref 1.000–1.030)
Total Protein, Urine: NEGATIVE
Urine Glucose: NEGATIVE
Urobilinogen, UA: 0.2 (ref 0.0–1.0)
pH: 6.5 (ref 5.0–8.0)

## 2017-09-21 MED ORDER — ZOSTER VAC RECOMB ADJUVANTED 50 MCG/0.5ML IM SUSR: 0.5000 mL | Freq: Once | INTRAMUSCULAR | 1 refills | Status: AC

## 2017-09-21 NOTE — Progress Notes (Signed)
Subjective:  Patient ID: Carrie [name], female    DOB: 11/11/1946  Age: 71 y.o. MRN: [id number]  CC: Annual Exam; Hypertension; and Gastroesophageal Reflux   HPI Carrie [name] presents for a CPX.  She has a hx of prolapsed bladder and wants to see a GYN for f/up. She complains of urinary frequency, wt gain, and feeling cold but offers no other complaints.  Past Medical History:  Diagnosis Date  . Colon polyps   . Esophageal spasm   . Fatigue   . Frequent PVCs   . GERD (gastroesophageal reflux disease)   . IBS (irritable bowel syndrome)   . Palpitations   . Recurrent UTI   . Seborrheic dermatitis of scalp 03/06/13  . Vitamin B 12 deficiency    Past Surgical History:  Procedure Laterality Date  . CHOLECYSTECTOMY N/A 08/23/2016   Procedure: LAPAROSCOPIC CHOLECYSTECTOMY;  Surgeon: Rolm Bookbinder, MD;  Location: [facility];  Service: General;  Laterality: N/A;  . COLECTOMY Right 1998   large colonic polyp  . ENDOSCOPIC VEIN LASER TREATMENT Bilateral 2010-2011  . [place]  2001  . SPINE SURGERY     3 since 11/2010, laminoectomy, lumbar fusion, diskectomy    reports that she quit smoking about 4 years ago. She has never used smokeless tobacco. She reports that she drinks about 1.8 oz of alcohol per week . She reports that she does not use drugs. family history includes Cancer in her father and sister; Hypertension in her mother, sister, sister, and sister. Allergies  Allergen Reactions  . Phenergan [Promethazine Hcl] Other (See Comments)    Pt states she got to where she could not breath when she got iv phenergan after surgery  . Azithromycin Other (See Comments)    Including macrobid  (weakness)  . Epinephrine Other (See Comments)    Heart arrythmias  . Erythromycin Diarrhea and Nausea And Vomiting  . Nitrofurantoin Nausea Only and Other (See Comments)    Weakness    Outpatient Medications Prior to Visit  Medication Sig Dispense Refill  . betamethasone dipropionate  1.51 % lotion 1 application topical daily    . calcium carbonate (TUMS CALCIUM FOR LIFE BONE) 750 MG chewable tablet Chew 1 tablet by mouth daily.     . Cholecalciferol (VITAMIN D3) 2000 units TABS Take 1 tablet by mouth daily.    . Ciclopirox 1 % shampoo Apply 1 application topically daily as needed (itching).     . fexofenadine (ALLEGRA) 180 MG tablet Take 180 mg by mouth as needed for allergies or rhinitis.     . fluticasone (FLONASE) 50 MCG/ACT nasal spray Place 2 sprays into the nose daily as needed for allergies or rhinitis.     . NORPACE CR 150 MG 12 hr capsule take 1 capsule by mouth twice a day 180 capsule 2  . pantoprazole (PROTONIX) 40 MG tablet Take 40 mg by mouth daily.    . ranitidine (ZANTAC) 150 MG tablet Take by mouth.    . tretinoin (RETIN-A) 0.025 % cream Apply 1 application topically every other day.  0  . hyoscyamine (LEVSIN/SL) 0.125 MG SL tablet Take 0.125 mg by mouth every 6 (six) hours as needed for cramping.     Vladimir Faster Glycol-Propyl Glycol (SYSTANE OP) Apply 1 drop to eye 2 (two) times daily.    . Probiotic Product (PROBIOTIC PO) Take 1 capsule by mouth daily.    Marland Kitchen trimethoprim (TRIMPEX) 100 MG tablet as needed. One pill after sex may repeat in  12 hours and if symptoms are present may take one twice daily for three days     No facility-administered medications prior to visit.     ROS Review of Systems  Constitutional: Positive for unexpected weight change. Negative for chills, diaphoresis and fatigue.  HENT: Negative.   Eyes: Negative.   Respiratory: Negative.  Negative for cough, chest tightness, shortness of breath, wheezing and stridor.   Cardiovascular: Negative for chest pain, palpitations and leg swelling.  Gastrointestinal: Negative.  Negative for abdominal pain, blood in stool, diarrhea, nausea and vomiting.  Endocrine: Positive for cold intolerance.  Genitourinary: Positive for frequency. Negative for difficulty urinating, dysuria, flank pain,  hematuria, urgency, vaginal bleeding and vaginal pain.  Musculoskeletal: Negative.   Skin: Negative.  Negative for color change and rash.  Allergic/Immunologic: Negative.   Neurological: Negative.  Negative for dizziness, weakness and light-headedness.  Hematological: Negative.   Psychiatric/Behavioral: Negative.     Objective:  BP (!) 144/84 (BP Location: Left Arm, Patient Position: Sitting, Cuff Size: Normal)   Pulse 81   Temp 97.8 F (36.6 C) (Oral)   Resp 16   Ht 5' 3.5" (1.613 m)   Wt 182 lb (82.6 kg)   SpO2 98%   BMI 31.73 kg/m   BP Readings from Last 3 Encounters:  09/21/17 (!) 144/84  07/24/17 (!) 142/80  03/13/17 (!) 148/86    Wt Readings from Last 3 Encounters:  09/21/17 182 lb (82.6 kg)  07/24/17 188 lb 14 oz (85.7 kg)  03/13/17 180 lb 6.4 oz (81.8 kg)    Physical Exam  Constitutional: She is oriented to person, place, and time. No distress.  HENT:  Mouth/Throat: Oropharynx is clear and moist. No oropharyngeal exudate.  Eyes: Conjunctivae are normal. Right eye exhibits no discharge. Left eye exhibits no discharge. No scleral icterus.  Neck: Normal range of motion. Neck supple. No JVD present. No thyromegaly present.  Cardiovascular: Normal rate, regular rhythm and intact distal pulses.   Extrasystoles are present. Exam reveals no gallop.   No murmur heard. Pulmonary/Chest: Effort normal and breath sounds normal. No respiratory distress. She has no wheezes. She has no rales. She exhibits no tenderness.  Abdominal: Soft. Bowel sounds are normal. She exhibits no distension and no mass. There is no tenderness. There is no rebound and no guarding.  Musculoskeletal: Normal range of motion. She exhibits no edema, tenderness or deformity.  Lymphadenopathy:    She has no cervical adenopathy.  Neurological: She is alert and oriented to person, place, and time.  Skin: Skin is warm and dry. No rash noted. She is not diaphoretic. No erythema. No pallor.  Vitals  reviewed.   Lab Results  Component Value Date   WBC 5.3 03/08/2017   HGB 12.1 03/08/2017   HCT 37.6 03/08/2017   PLT 226 03/08/2017   GLUCOSE 104 (H) 09/21/2017   CHOL 187 03/08/2017   TRIG 105 03/08/2017   HDL 72 03/08/2017   LDLCALC 94 03/08/2017   ALT 22 08/22/2016   AST 23 08/22/2016   NA 141 09/21/2017   K 4.5 09/21/2017   CL 104 09/21/2017   CREATININE 1.05 09/21/2017   BUN 19 09/21/2017   CO2 29 09/21/2017   TSH 1.58 09/21/2017    No results found.  Assessment & Plan:   Elonda was seen today for annual exam, hypertension and gastroesophageal reflux.  Diagnoses and all orders for this visit:  Essential hypertension- her BP is well controlled -     Basic metabolic panel;  Future -     Urinalysis, Routine w reflex microscopic; Future -     Thyroid Panel With TSH; Future  Premature ventricular contractions- PVC burden is well controlled and she is asx, will cont norpace -     Thyroid Panel With TSH; Future  Gastroesophageal reflux disease without esophagitis  Routine general medical examination at health care facility -     Zoster Vaccine Adjuvanted [facility]) injection; Inject 0.5 mLs into the muscle once.  Bladder prolapse, female, acquired -     Ambulatory referral to Gynecology  Need for influenza vaccination -     Flu vaccine HIGH DOSE PF (Fluzone High dose)   I have discontinued Ms. Pogue's hyoscyamine, trimethoprim, Polyethyl Glycol-Propyl Glycol (SYSTANE OP), and Probiotic Product (PROBIOTIC PO). I am also having her start on Zoster Vaccine Adjuvanted. Additionally, I am having her maintain her fexofenadine, calcium carbonate, Ciclopirox, fluticasone, betamethasone dipropionate, pantoprazole, tretinoin, Vitamin D3, NORPACE CR, and ranitidine.  Meds ordered this encounter  Medications  . Zoster Vaccine Adjuvanted [facility]) injection    Sig: Inject 0.5 mLs into the muscle once.    Dispense:  0.5 mL    Refill:  1   See AVS for instructions about  healthy living and anticipatory guidance.  Follow-up: Return in about 1 year (around 09/21/2018).  Scarlette Calico, MD

## 2017-09-21 NOTE — Patient Instructions (Signed)

## 2017-09-22 ENCOUNTER — Other Ambulatory Visit: Payer: Medicare Other

## 2017-09-22 ENCOUNTER — Other Ambulatory Visit: Payer: Self-pay | Admitting: Internal Medicine

## 2017-09-22 DIAGNOSIS — R8281 Pyuria: Secondary | ICD-10-CM

## 2017-09-22 DIAGNOSIS — M228X2 Other disorders of patella, left knee: Secondary | ICD-10-CM | POA: Diagnosis not present

## 2017-09-22 DIAGNOSIS — N390 Urinary tract infection, site not specified: Secondary | ICD-10-CM | POA: Diagnosis not present

## 2017-09-24 LAB — CULTURE, URINE COMPREHENSIVE
MICRO NUMBER:: 81171266
SPECIMEN QUALITY:: ADEQUATE

## 2017-09-25 ENCOUNTER — Encounter: Payer: Self-pay | Admitting: Internal Medicine

## 2017-09-25 LAB — HM DEXA SCAN: HM Dexa Scan: -1.5

## 2017-09-26 DIAGNOSIS — M228X2 Other disorders of patella, left knee: Secondary | ICD-10-CM | POA: Diagnosis not present

## 2017-09-26 NOTE — Assessment & Plan Note (Signed)

## 2017-09-26 NOTE — Assessment & Plan Note (Signed)
Her urine clx is neg for significant infection The staph is a contaminant

## 2017-09-27 NOTE — Telephone Encounter (Signed)
Pt called and would like these samples, will be coming tomorrow or Friday to come pick them up

## 2017-09-28 DIAGNOSIS — M228X2 Other disorders of patella, left knee: Secondary | ICD-10-CM | POA: Diagnosis not present

## 2017-09-29 NOTE — Telephone Encounter (Signed)
Pt called and will be coming to get these today

## 2017-09-29 NOTE — Telephone Encounter (Signed)
Pt came in for samples. We were out of them so she would like a call when some more come in. Thanks.

## 2017-10-03 ENCOUNTER — Other Ambulatory Visit: Payer: Self-pay | Admitting: Internal Medicine

## 2017-10-03 DIAGNOSIS — M228X2 Other disorders of patella, left knee: Secondary | ICD-10-CM | POA: Diagnosis not present

## 2017-10-03 DIAGNOSIS — Z139 Encounter for screening, unspecified: Secondary | ICD-10-CM

## 2017-10-07 ENCOUNTER — Ambulatory Visit (INDEPENDENT_AMBULATORY_CARE_PROVIDER_SITE_OTHER): Payer: Medicare Other | Admitting: Family Medicine

## 2017-10-07 ENCOUNTER — Encounter: Payer: Self-pay | Admitting: Family Medicine

## 2017-10-07 VITALS — BP 118/72 | HR 90 | Temp 97.6°F | Ht 63.5 in | Wt 186.0 lb

## 2017-10-07 DIAGNOSIS — J0110 Acute frontal sinusitis, unspecified: Secondary | ICD-10-CM

## 2017-10-07 DIAGNOSIS — R0982 Postnasal drip: Secondary | ICD-10-CM

## 2017-10-07 MED ORDER — IPRATROPIUM BROMIDE 0.03 % NA SOLN: 2.0000 | Freq: Four times a day (QID) | NASAL | 6 refills | Status: DC

## 2017-10-07 MED ORDER — DOXYCYCLINE HYCLATE 100 MG PO CAPS: 100.0000 mg | ORAL_CAPSULE | Freq: Two times a day (BID) | ORAL | 0 refills | Status: DC

## 2017-10-07 NOTE — Progress Notes (Signed)
[place] [facility] [address] [place], [place] [address] [phone]  Date:  10/07/2017   Name:  Carrie [name]   DOB:  1946/07/26   MRN:  [id number]  PCP:  Janith Lima, MD    Chief Complaint: URI (Cough at night, drainage. Symptoms for two weeks. )   History of Present Illness:  Carrie [name] is a 71 y.o. very pleasant female patient who presents with the following:  Here today with illness for about 10 days now Former smoker, history of PVCs controlled with Norpace Her illness started with a bad ST for 4 days. Then developed into sinus congestion, pressure, and PND She feels like the sx are more in her head than in her chest, but she is coughing quite a bit She is using an herbal honey type cough syrup  She did have a fever of 99.4- this was about 3 days ago. No other fevers noted No vomiting or diarrhea No real chills or body aches Her ST is now better The cough can be productive, more so at night She has more PND during the night  Her musuc started changing color the last few days   Patient Active Problem List   Diagnosis Date Noted  . Pyuria 09/22/2017  . Routine general medical examination at health care facility 09/21/2017  . Bladder prolapse, female, acquired 09/21/2017  . Estrogen deficiency 07/25/2017  . Colon polyp 11/24/2015  . Acid reflux 11/24/2015  . Osteoarthritis of spine with radiculopathy, lumbar region 11/24/2015  . Hereditary and idiopathic peripheral neuropathy 06/19/2015  . Osteopenia 05/28/2014  . Essential hypertension 03/05/2014  . Premature ventricular contractions 12/26/2013  . Ex-smoker 03/06/2013  . Allergic rhinitis 01/03/2012    Past Medical History:  Diagnosis Date  . Colon polyps   . Esophageal spasm   . Fatigue   . Frequent PVCs   . GERD (gastroesophageal reflux disease)   . IBS (irritable bowel syndrome)   . Palpitations   . Recurrent UTI   . Seborrheic  dermatitis of scalp 03/06/13  . Vitamin B 12 deficiency     Past Surgical History:  Procedure Laterality Date  . CHOLECYSTECTOMY N/A 08/23/2016   Procedure: LAPAROSCOPIC CHOLECYSTECTOMY;  Surgeon: Rolm Bookbinder, MD;  Location: [facility];  Service: General;  Laterality: N/A;  . COLECTOMY Right 1998   large colonic polyp  . ENDOSCOPIC VEIN LASER TREATMENT Bilateral 2010-2011  . [place]  2001  . SPINE SURGERY     3 since 11/2010, laminoectomy, lumbar fusion, diskectomy    Social History  Substance Use Topics  . Smoking status: Former Smoker    Quit date: 02/02/2013  . Smokeless tobacco: Never Used  . Alcohol use 1.8 oz/week    3 Glasses of wine per week     Comment: 1-2 glass of wine per week    Family History  Problem Relation Age of Onset  . Hypertension Mother   . Cancer Father   . Hypertension Sister   . Hypertension Sister   . Hypertension Sister   . Cancer Sister        bladder  . Early death Neg Hx   . Diabetes Neg Hx   . Heart disease Neg Hx   . Kidney disease Neg Hx   . Stroke Neg Hx     Allergies  Allergen Reactions  . Phenergan [Promethazine Hcl] Other (See Comments)    Pt states she got to  where she could not breath when she got iv phenergan after surgery  . Azithromycin Other (See Comments)    Including macrobid  (weakness)  . Epinephrine Other (See Comments)    Heart arrythmias  . Erythromycin Diarrhea and Nausea And Vomiting  . Nitrofurantoin Nausea Only and Other (See Comments)    Weakness    Medication list has been reviewed and updated.  Current Outpatient Prescriptions on File Prior to Visit  Medication Sig Dispense Refill  . betamethasone dipropionate 1.02 % lotion 1 application topical daily    . calcium carbonate (TUMS CALCIUM FOR LIFE BONE) 750 MG chewable tablet Chew 1 tablet by mouth daily.     . Cholecalciferol (VITAMIN D3) 2000 units TABS Take 1 tablet by mouth daily.    . Ciclopirox 1 % shampoo Apply 1 application topically  daily as needed (itching).     . fexofenadine (ALLEGRA) 180 MG tablet Take 180 mg by mouth as needed for allergies or rhinitis.     . fluticasone (FLONASE) 50 MCG/ACT nasal spray Place 2 sprays into the nose daily as needed for allergies or rhinitis.     . NORPACE CR 150 MG 12 hr capsule take 1 capsule by mouth twice a day 180 capsule 2  . pantoprazole (PROTONIX) 40 MG tablet Take 40 mg by mouth daily.    . ranitidine (ZANTAC) 150 MG tablet Take by mouth.    . tretinoin (RETIN-A) 0.025 % cream Apply 1 application topically every other day.  0   No current facility-administered medications on file prior to visit.     Review of Systems:  As per HPI- otherwise negative. No rash No tooth pain No fever No vomiting No diarrhea    Physical Examination: Vitals:   10/07/17 0900  BP: 118/72  Pulse: 90  Temp: 97.6 F (36.4 C)  SpO2: 99%   Vitals:   10/07/17 0900  Weight: 186 lb (84.4 kg)  Height: 5' 3.5" (1.613 m)   Body mass index is 32.43 kg/m. Ideal Body Weight: Weight in (lb) to have BMI = 25: 143.1  .GEN: WDWN, NAD, Non-toxic, A & O x 3, looks well, overweight HEENT: Atraumatic, Normocephalic. Neck supple. No masses, No LAD.  Bilateral TM wnl, oropharynx normal.  PEERL,EOMI.   Frontal sinus tenderness to percussion Ears and Nose: No external deformity. CV: RRR, No M/G/R. No JVD. No thrill. No extra heart sounds. PULM: CTA B, no wheezes, crackles, rhonchi. No retractions. No resp. distress. No accessory muscle use. EXTR: No c/c/e NEURO Normal gait.  PSYCH: Normally interactive. Conversant. Not depressed or anxious appearing.  Calm demeanor.    Assessment and Plan: Acute non-recurrent frontal sinusitis - Plan: doxycycline (VIBRAMYCIN) 100 MG capsule  Post-nasal drip - Plan: ipratropium (ATROVENT) 0.03 % nasal spray  Acute frontal sinusitis- perhaps developing bronchitis as well Treat with doxycycline atrovent nasal prn She will follow-up if not better Continue her  cough suppressant and nasal saline as needed   Signed Lamar Blinks, MD

## 2017-10-07 NOTE — Patient Instructions (Signed)
I hope that you feel much better soon! Use the doxycycline twice a day for 10 days- take with food and water.  You can use the nasal spray as needed for nasal drainage/ post nasal drip.  Use this up to 4x a day as needed Please let us know if you don't feel better in the next few days- Sooner if worse.

## 2017-10-10 DIAGNOSIS — M228X2 Other disorders of patella, left knee: Secondary | ICD-10-CM | POA: Diagnosis not present

## 2017-10-12 DIAGNOSIS — M228X2 Other disorders of patella, left knee: Secondary | ICD-10-CM | POA: Diagnosis not present

## 2017-10-17 DIAGNOSIS — M228X2 Other disorders of patella, left knee: Secondary | ICD-10-CM | POA: Diagnosis not present

## 2017-10-19 DIAGNOSIS — M228X2 Other disorders of patella, left knee: Secondary | ICD-10-CM | POA: Diagnosis not present

## 2017-10-19 DIAGNOSIS — S67192A Crushing injury of right middle finger, initial encounter: Secondary | ICD-10-CM | POA: Diagnosis not present

## 2017-10-23 DIAGNOSIS — M228X2 Other disorders of patella, left knee: Secondary | ICD-10-CM | POA: Diagnosis not present

## 2017-10-25 ENCOUNTER — Other Ambulatory Visit: Payer: Self-pay | Admitting: Internal Medicine

## 2017-10-31 DIAGNOSIS — M228X2 Other disorders of patella, left knee: Secondary | ICD-10-CM | POA: Diagnosis not present

## 2017-11-02 DIAGNOSIS — M228X2 Other disorders of patella, left knee: Secondary | ICD-10-CM | POA: Diagnosis not present

## 2017-11-07 DIAGNOSIS — M228X2 Other disorders of patella, left knee: Secondary | ICD-10-CM | POA: Diagnosis not present

## 2017-11-09 DIAGNOSIS — M228X2 Other disorders of patella, left knee: Secondary | ICD-10-CM | POA: Diagnosis not present

## 2017-11-10 DIAGNOSIS — M2242 Chondromalacia patellae, left knee: Secondary | ICD-10-CM | POA: Diagnosis not present

## 2017-11-10 DIAGNOSIS — M1712 Unilateral primary osteoarthritis, left knee: Secondary | ICD-10-CM | POA: Diagnosis not present

## 2017-11-15 ENCOUNTER — Encounter: Payer: Self-pay | Admitting: Obstetrics & Gynecology

## 2017-11-15 ENCOUNTER — Ambulatory Visit (INDEPENDENT_AMBULATORY_CARE_PROVIDER_SITE_OTHER): Payer: Medicare Other | Admitting: Obstetrics & Gynecology

## 2017-11-15 VITALS — BP 150/88 | Ht 63.5 in | Wt 185.0 lb

## 2017-11-15 DIAGNOSIS — M8580 Other specified disorders of bone density and structure, unspecified site: Secondary | ICD-10-CM

## 2017-11-15 DIAGNOSIS — Z01411 Encounter for gynecological examination (general) (routine) with abnormal findings: Secondary | ICD-10-CM | POA: Diagnosis not present

## 2017-11-15 DIAGNOSIS — N952 Postmenopausal atrophic vaginitis: Secondary | ICD-10-CM

## 2017-11-15 DIAGNOSIS — Z780 Asymptomatic menopausal state: Secondary | ICD-10-CM

## 2017-11-15 DIAGNOSIS — N361 Urethral diverticulum: Secondary | ICD-10-CM

## 2017-11-15 DIAGNOSIS — M8588 Other specified disorders of bone density and structure, other site: Secondary | ICD-10-CM

## 2017-11-15 LAB — HM PAP SMEAR

## 2017-11-15 NOTE — Progress Notes (Signed)
Carrie [name] 01-Sep-1946 [id number]   History:    71 y.o. G2P2L2  Married x 51 yrs  RP:  New patient presenting for annual gyn exam   HPI: Menopause well on no hormone replacement therapy.  No postmenopausal bleeding.  No pelvic pain.  Occasionally sexually active.  Complains of mild discomfort vaginally.  History of rectocele repair in the past.  Tendency towards constipation.  Does not have to push vaginally to pass her bowel movements.  Urine normal.  Breasts normal.  Health labs with family physician October 2018.  Body mass index 32.26.  Taking vitamin D supplements, calcium in nutrition and doing weightbearing physical activity regularly.  Past medical history,surgical history, family history and social history were all reviewed and documented in the [facility] chart.  Gynecologic History No LMP recorded. Patient is postmenopausal. Contraception: post menopausal status Last Pap: Years ago. Results were: normal Last mammogram: Scheduled for 11/17/2017 [place] due next year Bone density Osteopenia 09/2017  Obstetric History OB History  Gravida Para Term Preterm AB Living  2 2       2   SAB TAB Ectopic Multiple Live Births               # Outcome Date GA Lbr Len/2nd Weight Sex Delivery Anes PTL Lv  2 Para           1 Para                ROS: A ROS was performed and pertinent positives and negatives are included in the history.  GENERAL: No fevers or chills. HEENT: No change in vision, no earache, sore throat or sinus congestion. NECK: No pain or stiffness. CARDIOVASCULAR: No chest pain or pressure. No palpitations. PULMONARY: No shortness of breath, cough or wheeze. GASTROINTESTINAL: No abdominal pain, nausea, vomiting or diarrhea, melena or bright red blood per rectum. GENITOURINARY: No urinary frequency, urgency, hesitancy or dysuria. MUSCULOSKELETAL: No joint or muscle pain, no back pain, no recent trauma. DERMATOLOGIC: No rash, no itching, no lesions. ENDOCRINE: No polyuria,  polydipsia, no heat or cold intolerance. No recent change in weight. HEMATOLOGICAL: No anemia or easy bruising or bleeding. NEUROLOGIC: No headache, seizures, numbness, tingling or weakness. PSYCHIATRIC: No depression, no loss of interest in normal activity or change in sleep pattern.     Exam:   BP (!) 150/88   Ht 5' 3.5" (1.613 m)   Wt 185 lb (83.9 kg)   BMI 32.26 kg/m   Body mass index is 32.26 kg/m.  General appearance : Well developed well nourished female. No acute distress HEENT: Eyes: no retinal hemorrhage or exudates,  Neck supple, trachea midline, no carotid bruits, no thyroidmegaly Lungs: Clear to auscultation, no rhonchi or wheezes, or rib retractions  Heart: Regular rate and rhythm, no murmurs or gallops Breast:Examined in sitting and supine position were symmetrical in appearance, no palpable masses or tenderness,  no skin retraction, no nipple inversion, no nipple discharge, no skin discoloration, no axillary or supraclavicular lymphadenopathy Abdomen: no palpable masses or tenderness, no rebound or guarding Extremities: no edema or skin discoloration or tenderness  Pelvic: vulva normal  Bartholin, Skene Glands: Within normal limits.  Small cyst anteriorly, possible urethral diverticulum.  Non-tender.             Vagina: No gross lesions or discharge  Cervix: No gross lesions or discharge.  Pap reflex done.  Uterus  AV, normal size, shape and consistency, non-tender and mobile  Adnexa  Without masses or  tenderness  Anus and perineum  normal     Assessment/Plan:  71 y.o. female for annual exam   1. Encounter for gynecological examination with abnormal finding Gynecologic exam normal except for a small anterior vaginal cyst, possible urethral diverticulum.  Reflex done.  Breast exam normal.  Mammography scheduled for November 17, 2017.  Colonoscopy due next year.  Health labs with family physician.  2. Menopause present Menopause well-tolerated on no hormone  replacement therapy.  No postmenopausal bleeding.  3. Post-menopausal atrophic vaginitis Mildly symptomatic atrophic vaginitis.  Declines local estrogen therapy.  Recommend Astroglide or coconut oil when sexually active.  4. Osteopenia, unspecified location Osteopenia on recent bone density.  Vitamin D supplements, calcium rich nutrition in regular weightbearing physical activity recommended.  5. Urethral diverticulum Observation recommended.  Will refer to urology if becomes symptomatic.  Counseling on above issues more than 50% for 20 minutes.  Princess Bruins MD, 12:12 PM 11/15/2017

## 2017-11-16 LAB — PAP IG W/ RFLX HPV ASCU

## 2017-11-17 ENCOUNTER — Ambulatory Visit
Admission: RE | Admit: 2017-11-17 | Discharge: 2017-11-17 | Disposition: A | Discharge status: Home / Self Care | Payer: Medicare Other | Source: Ambulatory Visit | Attending: Internal Medicine | Admitting: Internal Medicine

## 2017-11-17 DIAGNOSIS — Z139 Encounter for screening, unspecified: Secondary | ICD-10-CM

## 2017-11-17 DIAGNOSIS — Z1231 Encounter for screening mammogram for malignant neoplasm of breast: Secondary | ICD-10-CM | POA: Diagnosis not present

## 2017-11-17 LAB — HM MAMMOGRAPHY

## 2017-11-18 ENCOUNTER — Encounter: Payer: Self-pay | Admitting: Obstetrics & Gynecology

## 2017-11-18 NOTE — Patient Instructions (Signed)
1. Encounter for gynecological examination with abnormal finding Gynecologic exam normal except for a small anterior vaginal cyst, possible urethral diverticulum.  Reflex done.  Breast exam normal.  Mammography scheduled for November 17, 2017.  Colonoscopy due next year.  Health labs with family physician.  2. Menopause present Menopause well-tolerated on no hormone replacement therapy.  No postmenopausal bleeding.  3. Post-menopausal atrophic vaginitis Mildly symptomatic atrophic vaginitis.  Declines local estrogen therapy.  Recommend Astroglide or coconut oil when sexually active.  4. Osteopenia, unspecified location Osteopenia on recent bone density.  Vitamin D supplements, calcium rich nutrition in regular weightbearing physical activity recommended.  5. Urethral diverticulum Observation recommended.  Will refer to urology if becomes symptomatic.  Carrie [name], it was a pleasure meeting you today!  I will inform you of your results as soon as available.   Health Maintenance for Postmenopausal Women Menopause is a normal process in which your reproductive ability comes to an end. This process happens gradually over a span of months to years, usually between the ages of 68 and 32. Menopause is complete when you have missed 12 consecutive menstrual periods. It is important to talk with your health care provider about some of the most common conditions that affect postmenopausal women, such as heart disease, cancer, and bone loss (osteoporosis). Adopting a healthy lifestyle and getting preventive care can help to promote your health and wellness. Those actions can also lower your chances of developing some of these common conditions. What should I know about menopause? During menopause, you may experience a number of symptoms, such as:  Moderate-to-severe hot flashes.  Night sweats.  Decrease in sex drive.  Mood swings.  Headaches.  Tiredness.  Irritability.  Memory  problems.  Insomnia.  Choosing to treat or not to treat menopausal changes is an individual decision that you make with your health care provider. What should I know about hormone replacement therapy and supplements? Hormone therapy products are effective for treating symptoms that are associated with menopause, such as hot flashes and night sweats. Hormone replacement carries certain risks, especially as you become older. If you are thinking about using estrogen or estrogen with progestin treatments, discuss the benefits and risks with your health care provider. What should I know about heart disease and stroke? Heart disease, heart attack, and stroke become more likely as you age. This may be due, in part, to the hormonal changes that your body experiences during menopause. These can affect how your body processes dietary fats, triglycerides, and cholesterol. Heart attack and stroke are both medical emergencies. There are many things that you can do to help prevent heart disease and stroke:  Have your blood pressure checked at least every 1-2 years. High blood pressure causes heart disease and increases the risk of stroke.  If you are 37-65 years old, ask your health care provider if you should take aspirin to prevent a heart attack or a stroke.  Do not use any tobacco products, including cigarettes, chewing tobacco, or electronic cigarettes. If you need help quitting, ask your health care provider.  It is important to eat a healthy diet and maintain a healthy weight. ? Be sure to include plenty of vegetables, fruits, low-fat dairy products, and lean protein. ? Avoid eating foods that are high in solid fats, added sugars, or salt (sodium).  Get regular exercise. This is one of the most important things that you can do for your health. ? Try to exercise for at least 150 minutes each week. The  type of exercise that you do should increase your heart rate and make you sweat. This is known as  moderate-intensity exercise. ? Try to do strengthening exercises at least twice each week. Do these in addition to the moderate-intensity exercise.  Know your numbers.Ask your health care provider to check your cholesterol and your blood glucose. Continue to have your blood tested as directed by your health care provider.  What should I know about cancer screening? There are several types of cancer. Take the following steps to reduce your risk and to catch any cancer development as early as possible. Breast Cancer  Practice breast self-awareness. ? This means understanding how your breasts normally appear and feel. ? It also means doing regular breast self-exams. Let your health care provider know about any changes, no matter how small.  If you are 30 or older, have a clinician do a breast exam (clinical breast exam or CBE) every year. Depending on your age, family history, and medical history, it may be recommended that you also have a yearly breast X-ray (mammogram).  If you have a family history of breast cancer, talk with your health care provider about genetic screening.  If you are at high risk for breast cancer, talk with your health care provider about having an MRI and a mammogram every year.  Breast cancer (BRCA) gene test is recommended for women who have family members with BRCA-related cancers. Results of the assessment will determine the need for genetic counseling and BRCA1 and for BRCA2 testing. BRCA-related cancers include these types: ? Breast. This occurs in males or females. ? Ovarian. ? Tubal. This may also be called fallopian tube cancer. ? Cancer of the abdominal or pelvic lining (peritoneal cancer). ? Prostate. ? Pancreatic.  Cervical, Uterine, and Ovarian Cancer Your health care provider may recommend that you be screened regularly for cancer of the pelvic organs. These include your ovaries, uterus, and vagina. This screening involves a pelvic exam, which  includes checking for microscopic changes to the surface of your cervix (Pap test).  For women ages 21-65, health care providers may recommend a pelvic exam and a Pap test every three years. For women ages 31-65, they may recommend the Pap test and pelvic exam, combined with testing for human papilloma virus (HPV), every five years. Some types of HPV increase your risk of cervical cancer. Testing for HPV may also be done on women of any age who have unclear Pap test results.  Other health care providers may not recommend any screening for nonpregnant women who are considered low risk for pelvic cancer and have no symptoms. Ask your health care provider if a screening pelvic exam is right for you.  If you have had past treatment for cervical cancer or a condition that could lead to cancer, you need Pap tests and screening for cancer for at least 20 years after your treatment. If Pap tests have been discontinued for you, your risk factors (such as having a new sexual partner) need to be reassessed to determine if you should start having screenings again. Some women have medical problems that increase the chance of getting cervical cancer. In these cases, your health care provider may recommend that you have screening and Pap tests more often.  If you have a family history of uterine cancer or ovarian cancer, talk with your health care provider about genetic screening.  If you have vaginal bleeding after reaching menopause, tell your health care provider.  There are currently no reliable  tests available to screen for ovarian cancer.  Lung Cancer Lung cancer screening is recommended for adults 21-6 years old who are at high risk for lung cancer because of a history of smoking. A yearly low-dose CT scan of the lungs is recommended if you:  Currently smoke.  Have a history of at least 30 pack-years of smoking and you currently smoke or have quit within the past 15 years. A pack-year is smoking an  average of one pack of cigarettes per day for one year.  Yearly screening should:  Continue until it has been 15 years since you quit.  Stop if you develop a health problem that would prevent you from having lung cancer treatment.  Colorectal Cancer  This type of cancer can be detected and can often be prevented.  Routine colorectal cancer screening usually begins at age 107 and continues through age 36.  If you have risk factors for colon cancer, your health care provider may recommend that you be screened at an earlier age.  If you have a family history of colorectal cancer, talk with your health care provider about genetic screening.  Your health care provider may also recommend using home test kits to check for hidden blood in your stool.  A small camera at the end of a tube can be used to examine your colon directly (sigmoidoscopy or colonoscopy). This is done to check for the earliest forms of colorectal cancer.  Direct examination of the colon should be repeated every 5-10 years until age 72. However, if early forms of precancerous polyps or small growths are found or if you have a family history or genetic risk for colorectal cancer, you may need to be screened more often.  Skin Cancer  Check your skin from head to toe regularly.  Monitor any moles. Be sure to tell your health care provider: ? About any new moles or changes in moles, especially if there is a change in a mole's shape or color. ? If you have a mole that is larger than the size of a pencil eraser.  If any of your family members has a history of skin cancer, especially at a young age, talk with your health care provider about genetic screening.  Always use sunscreen. Apply sunscreen liberally and repeatedly throughout the day.  Whenever you are outside, protect yourself by wearing long sleeves, pants, a wide-brimmed hat, and sunglasses.  What should I know about osteoporosis? Osteoporosis is a condition in  which bone destruction happens more quickly than new bone creation. After menopause, you may be at an increased risk for osteoporosis. To help prevent osteoporosis or the bone fractures that can happen because of osteoporosis, the following is recommended:  If you are 30-31 years old, get at least 1,000 mg of calcium and at least 600 mg of vitamin D per day.  If you are older than age 6 but younger than age 19, get at least 1,200 mg of calcium and at least 600 mg of vitamin D per day.  If you are older than age 21, get at least 1,200 mg of calcium and at least 800 mg of vitamin D per day.  Smoking and excessive alcohol intake increase the risk of osteoporosis. Eat foods that are rich in calcium and vitamin D, and do weight-bearing exercises several times each week as directed by your health care provider. What should I know about how menopause affects my mental health? Depression may occur at any age, but it is more  common as you become older. Common symptoms of depression include:  Low or sad mood.  Changes in sleep patterns.  Changes in appetite or eating patterns.  Feeling an overall lack of motivation or enjoyment of activities that you previously enjoyed.  Frequent crying spells.  Talk with your health care provider if you think that you are experiencing depression. What should I know about immunizations? It is important that you get and maintain your immunizations. These include:  Tetanus, diphtheria, and pertussis (Tdap) booster vaccine.  Influenza every year before the flu season begins.  Pneumonia vaccine.  Shingles vaccine.  Your health care provider may also recommend other immunizations. This information is not intended to replace advice given to you by your health care provider. Make sure you discuss any questions you have with your health care provider. Document Released: 01/13/2006 Document Revised: 06/10/2016 Document Reviewed: 08/25/2015 Elsevier Interactive  Patient Education  2018 [employer].

## 2018-01-19 DIAGNOSIS — M25551 Pain in right hip: Secondary | ICD-10-CM | POA: Diagnosis not present

## 2018-02-27 DIAGNOSIS — H52222 Regular astigmatism, left eye: Secondary | ICD-10-CM | POA: Diagnosis not present

## 2018-02-27 DIAGNOSIS — H2513 Age-related nuclear cataract, bilateral: Secondary | ICD-10-CM | POA: Diagnosis not present

## 2018-02-27 DIAGNOSIS — H04123 Dry eye syndrome of bilateral lacrimal glands: Secondary | ICD-10-CM | POA: Diagnosis not present

## 2018-02-27 DIAGNOSIS — H52221 Regular astigmatism, right eye: Secondary | ICD-10-CM | POA: Diagnosis not present

## 2018-02-27 DIAGNOSIS — H1851 Endothelial corneal dystrophy: Secondary | ICD-10-CM | POA: Diagnosis not present

## 2018-02-27 DIAGNOSIS — H524 Presbyopia: Secondary | ICD-10-CM | POA: Diagnosis not present

## 2018-02-27 DIAGNOSIS — H5212 Myopia, left eye: Secondary | ICD-10-CM | POA: Diagnosis not present

## 2018-02-28 DIAGNOSIS — M7061 Trochanteric bursitis, right hip: Secondary | ICD-10-CM | POA: Diagnosis not present

## 2018-02-28 DIAGNOSIS — M25551 Pain in right hip: Secondary | ICD-10-CM | POA: Diagnosis not present

## 2018-03-05 DIAGNOSIS — M25551 Pain in right hip: Secondary | ICD-10-CM | POA: Diagnosis not present

## 2018-03-07 DIAGNOSIS — M25551 Pain in right hip: Secondary | ICD-10-CM | POA: Diagnosis not present

## 2018-03-12 ENCOUNTER — Telehealth: Payer: Self-pay | Admitting: Internal Medicine

## 2018-03-12 DIAGNOSIS — M25551 Pain in right hip: Secondary | ICD-10-CM | POA: Diagnosis not present

## 2018-03-12 NOTE — Telephone Encounter (Signed)
I could probably work her in this Wednesday 03/14/18 at 11:30

## 2018-03-12 NOTE — Telephone Encounter (Signed)
Spoke with patient who states she has had 2 episodes recently of fast HR which makes her feel bad. Last week, she took a shower, felt weak and pulse felt fast but she does not know HR. States Today, she also had an episode when she got out of the shower and HR > 120 bpm. She states she is taking Norpace 150 mg in the morning but not long before these episodes occurred. She takes her pills with a glass of water before shower; denies breakfast intake. I asked if she feels that BP is low during these times and she states she monitors BP and it is usually between 023-343 mmHg systolic and 56-86'H mmHg diastolic. Light headed on occasion, denies syncope, denies that showers are extremely hot. Denies recent life stressors or changes such as weight loss, surgery, or illness. Denies recent lab work. States she is leaving May 15 for river cruise and wants to feel well on that cruise. She has an appointment in June with Dr. Rayann Heman. I advised that I will send a message to Dr. Jackalyn Lombard scheduler to see if a sooner appointment is available and that I will also send the message to Dr. Rayann Heman for advice. I advised our office will call her back to report changes in therapy and/or appointment. She verbalized understanding and agreement with plan and thanked me for the call.

## 2018-03-12 NOTE — Telephone Encounter (Signed)
New Message:   FYI:  Pt states she will be out of town 4/24-4/27

## 2018-03-12 NOTE — Telephone Encounter (Signed)
Noted. Dates sent to Dr. Jackalyn Lombard scheduler, Lorenda Hatchet.

## 2018-03-12 NOTE — Telephone Encounter (Signed)
STAT if HR is under 50 or over 120 (normal HR is 60-100 beats per minute)  1) What is your heart rate? 120  Do you have a log of your heart rate readings (document readings)?  80-120 Do you have any other symptoms?  Pt verbalized that she felt sweaty and weak   Now it is 89-92

## 2018-03-13 ENCOUNTER — Encounter: Payer: Self-pay | Admitting: Internal Medicine

## 2018-03-13 NOTE — Telephone Encounter (Signed)
Appt made for 03/14/2018.  No further action needed.

## 2018-03-14 ENCOUNTER — Encounter: Payer: Self-pay | Admitting: Internal Medicine

## 2018-03-14 ENCOUNTER — Ambulatory Visit (INDEPENDENT_AMBULATORY_CARE_PROVIDER_SITE_OTHER): Payer: Medicare Other | Admitting: Internal Medicine

## 2018-03-14 VITALS — BP 132/72 | HR 87 | Ht 63.5 in | Wt 185.0 lb

## 2018-03-14 DIAGNOSIS — R0602 Shortness of breath: Secondary | ICD-10-CM | POA: Diagnosis not present

## 2018-03-14 DIAGNOSIS — I493 Ventricular premature depolarization: Secondary | ICD-10-CM

## 2018-03-14 DIAGNOSIS — I10 Essential (primary) hypertension: Secondary | ICD-10-CM

## 2018-03-14 DIAGNOSIS — R002 Palpitations: Secondary | ICD-10-CM

## 2018-03-14 NOTE — Progress Notes (Signed)
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 female who presents today for routine electrophysiology followup.  Since last being seen in our clinic, the patient reports doing reasonably well.  She has not been exercising due to DJD.  Her weight is also up.  With this, she finds that she has SOB with moderate activity or on stairs.  She also notices sinus tachycardia at times which is alarming to her.  Today, she denies symptoms of  chest pain,  lower extremity edema, dizziness, presyncope, or syncope.  The patient is otherwise without complaint today.   Past Medical History:  Diagnosis Date  . Colon polyps   . Esophageal spasm   . Fatigue   . Frequent PVCs   . GERD (gastroesophageal reflux disease)   . IBS (irritable bowel syndrome)   . Palpitations   . Recurrent UTI   . Seborrheic dermatitis of scalp 03/06/13  . Vitamin B 12 deficiency    Past Surgical History:  Procedure Laterality Date  . CHOLECYSTECTOMY N/A 08/23/2016   Procedure: LAPAROSCOPIC CHOLECYSTECTOMY;  Surgeon: Rolm Bookbinder, MD;  Location: [facility];  Service: General;  Laterality: N/A;  . COLECTOMY Right 1998   large colonic polyp  . ENDOSCOPIC VEIN LASER TREATMENT Bilateral 2010-2011  . [place]  2001  . SPINE SURGERY     3 since 11/2010, laminoectomy, lumbar fusion, diskectomy    ROS- all systems are reviewed and negatives except as per HPI above  Current Outpatient Medications  Medication Sig Dispense Refill  . betamethasone dipropionate 5.28 % lotion 1 application topical daily    . calcium carbonate (TUMS CALCIUM FOR LIFE BONE) 750 MG chewable tablet Chew 1 tablet by mouth daily.     . Cholecalciferol (VITAMIN D3) 2000 units TABS Take 1 tablet by mouth daily.    . Ciclopirox 1 % shampoo Apply 1 application topically daily as needed (itching).     . fexofenadine (ALLEGRA) 180 MG tablet Take 180 mg by mouth as needed for allergies or rhinitis.     . fluticasone  (FLONASE) 50 MCG/ACT nasal spray Place 2 sprays into the nose daily as needed for allergies or rhinitis.     . Glucosamine-Chondroitin (COSAMIN DS PO) Take 2 capsules by mouth daily.    . hyoscyamine (LEVSIN) 0.125 MG/5ML ELIX Take 0.125 mg by mouth every 30 (thirty) days.    . NORPACE CR 150 MG 12 hr capsule take 1 capsule by mouth twice a day (Patient taking differently: take 1 capsule by mouth once a day) 180 capsule 2  . Polyvinyl Alcohol-Povidone (FRESHKOTE OP) Apply to eye daily.    . ranitidine (ZANTAC) 150 MG tablet Take 150 mg by mouth daily.  3  . Sodium Chloride, Hypertonic, (MURO 128 OP) Apply to eye daily.    Marland Kitchen tretinoin (RETIN-A) 0.025 % cream Apply 1 application topically every other day.  0   No current facility-administered medications for this visit.     Physical Exam: Vitals:   03/14/18 1141  BP: 132/72  Pulse: 87  Weight: 185 lb (83.9 kg)  Height: 5' 3.5" (1.613 m)    GEN- The patient is well appearing, alert and oriented x 3 today.   Head- normocephalic, atraumatic Eyes-  Sclera clear, conjunctiva pink Ears- hearing intact Oropharynx- clear Lungs- Clear to ausculation bilaterally, normal work of breathing Heart- Regular rate and rhythm, no murmurs, rubs or gallops, PMI not laterally displaced GI- soft, NT,  ND, + BS Extremities- no clubbing, cyanosis, or edema  EKG tracing ordered today is personally reviewed and shows sinus rhythm 87 bpm, poor r wave progression  Assessment and Plan:  1. HTN Stable No change required today  2. Palpitations, PVCs Well controlled  3. SOB Possibly ischmia Will order exercise myoview and echo If normal, regular exercise and weight loss are advised  4. Sinus tachycardia Adequate hydration and regular exercise are advisd Could consider nadolol 10mg  daily if not improved  Return to see me in 4 months  Thompson Grayer MD, [facility] 03/14/2018 12:03 PM

## 2018-03-14 NOTE — Patient Instructions (Addendum)
Medication Instructions:  Your physician recommends that you continue on your current medications as directed. Please refer to the Current Medication list given to you today.  Labwork: None ordered.  Testing/Procedures: Your physician has requested that you have an echocardiogram. Echocardiography is a painless test that uses sound waves to create images of your heart. It provides your doctor with information about the size and shape of your heart and how well your heart's chambers and valves are working. This procedure takes approximately one hour. There are no restrictions for this procedure.  Please schedule for an ECHO.  Your physician has requested that you have en exercise stress myoview. For further information please visit [email or web address]. Please follow instruction sheet, as given.  Please schedule for an exercise myoview.  Follow-Up: Your physician wants you to follow-up in: 4 months with Dr. Rayann Heman.     Any Other Special Instructions Will Be Listed Below (If Applicable).  If you need a refill on your cardiac medications before your next appointment, please call your pharmacy.

## 2018-03-15 DIAGNOSIS — M25551 Pain in right hip: Secondary | ICD-10-CM | POA: Diagnosis not present

## 2018-03-16 NOTE — Addendum Note (Signed)
Addended by: Marlis Edelson C on: 03/16/2018 10:58 AM   Modules accepted: Orders

## 2018-03-19 ENCOUNTER — Telehealth (HOSPITAL_COMMUNITY): Payer: Self-pay | Admitting: *Deleted

## 2018-03-19 DIAGNOSIS — M25551 Pain in right hip: Secondary | ICD-10-CM | POA: Diagnosis not present

## 2018-03-19 NOTE — Telephone Encounter (Signed)
Patient given detailed instructions per Myocardial Perfusion Study Information Sheet for the test on 03/22/18 at 0945. Patient notified to arrive 15 minutes early and that it is imperative to arrive on time for appointment to keep from having the test rescheduled.  If you need to cancel or reschedule your appointment, please call the office within 24 hours of your appointment. . Patient verbalized understanding.Schylar Allard, Ranae Palms

## 2018-03-22 ENCOUNTER — Ambulatory Visit (HOSPITAL_COMMUNITY): Payer: Medicare Other | Attending: Cardiology

## 2018-03-22 ENCOUNTER — Other Ambulatory Visit: Payer: Self-pay

## 2018-03-22 ENCOUNTER — Ambulatory Visit (HOSPITAL_BASED_OUTPATIENT_CLINIC_OR_DEPARTMENT_OTHER): Payer: Medicare Other

## 2018-03-22 DIAGNOSIS — Z87891 Personal history of nicotine dependence: Secondary | ICD-10-CM | POA: Insufficient documentation

## 2018-03-22 DIAGNOSIS — I272 Pulmonary hypertension, unspecified: Secondary | ICD-10-CM | POA: Diagnosis not present

## 2018-03-22 DIAGNOSIS — R002 Palpitations: Secondary | ICD-10-CM | POA: Diagnosis not present

## 2018-03-22 DIAGNOSIS — R0602 Shortness of breath: Secondary | ICD-10-CM | POA: Insufficient documentation

## 2018-03-22 DIAGNOSIS — R9439 Abnormal result of other cardiovascular function study: Secondary | ICD-10-CM | POA: Insufficient documentation

## 2018-03-22 DIAGNOSIS — I10 Essential (primary) hypertension: Secondary | ICD-10-CM | POA: Diagnosis not present

## 2018-03-22 LAB — MYOCARDIAL PERFUSION IMAGING
Estimated workload: 5.8 METS
Exercise duration (min): 4 min
LV dias vol: 111 mL (ref 46–106)
LV sys vol: 58 mL
MPHR: 147 {beats}/min
Peak HR: 133 {beats}/min
Percent HR: 90 %
RATE: 0.42
RPE: 18
Rest HR: 65 {beats}/min
SDS: 2
SRS: 2
SSS: 4
TID: 0.94

## 2018-03-22 MED ORDER — TECHNETIUM TC 99M TETROFOSMIN IV KIT
32.1000 | PACK | Freq: Once | INTRAVENOUS | Status: AC | PRN
  Administered 2018-03-22: 32.1 via INTRAVENOUS
  Filled 2018-03-22: qty 33

## 2018-03-22 MED ORDER — TECHNETIUM TC 99M TETROFOSMIN IV KIT
10.2000 | PACK | Freq: Once | INTRAVENOUS | Status: AC | PRN
  Administered 2018-03-22: 10.2 via INTRAVENOUS
  Filled 2018-03-22: qty 11

## 2018-03-26 DIAGNOSIS — M25551 Pain in right hip: Secondary | ICD-10-CM | POA: Diagnosis not present

## 2018-04-02 DIAGNOSIS — M25551 Pain in right hip: Secondary | ICD-10-CM | POA: Diagnosis not present

## 2018-04-03 DIAGNOSIS — M25551 Pain in right hip: Secondary | ICD-10-CM | POA: Diagnosis not present

## 2018-04-06 DIAGNOSIS — M25551 Pain in right hip: Secondary | ICD-10-CM | POA: Diagnosis not present

## 2018-05-07 ENCOUNTER — Ambulatory Visit: Payer: Medicare Other | Admitting: Internal Medicine

## 2018-05-08 DIAGNOSIS — M25551 Pain in right hip: Secondary | ICD-10-CM | POA: Diagnosis not present

## 2018-06-18 ENCOUNTER — Other Ambulatory Visit: Payer: Self-pay | Admitting: Internal Medicine

## 2018-07-05 DIAGNOSIS — J069 Acute upper respiratory infection, unspecified: Secondary | ICD-10-CM | POA: Diagnosis not present

## 2018-07-05 DIAGNOSIS — H8120 Vestibular neuronitis, unspecified ear: Secondary | ICD-10-CM | POA: Diagnosis not present

## 2018-07-17 DIAGNOSIS — Z980 Intestinal bypass and anastomosis status: Secondary | ICD-10-CM | POA: Diagnosis not present

## 2018-07-17 DIAGNOSIS — Z933 Colostomy status: Secondary | ICD-10-CM | POA: Diagnosis not present

## 2018-07-17 DIAGNOSIS — Z1211 Encounter for screening for malignant neoplasm of colon: Secondary | ICD-10-CM | POA: Diagnosis not present

## 2018-07-17 DIAGNOSIS — Z9049 Acquired absence of other specified parts of digestive tract: Secondary | ICD-10-CM | POA: Diagnosis not present

## 2018-07-17 DIAGNOSIS — K635 Polyp of colon: Secondary | ICD-10-CM | POA: Diagnosis not present

## 2018-07-17 DIAGNOSIS — Z86010 Personal history of colonic polyps: Secondary | ICD-10-CM | POA: Diagnosis not present

## 2018-07-17 DIAGNOSIS — D125 Benign neoplasm of sigmoid colon: Secondary | ICD-10-CM | POA: Diagnosis not present

## 2018-07-17 DIAGNOSIS — K589 Irritable bowel syndrome without diarrhea: Secondary | ICD-10-CM | POA: Diagnosis not present

## 2018-07-23 ENCOUNTER — Other Ambulatory Visit: Payer: Self-pay | Admitting: Internal Medicine

## 2018-07-25 ENCOUNTER — Ambulatory Visit (INDEPENDENT_AMBULATORY_CARE_PROVIDER_SITE_OTHER): Payer: Medicare Other | Admitting: Internal Medicine

## 2018-07-25 ENCOUNTER — Encounter: Payer: Self-pay | Admitting: Internal Medicine

## 2018-07-25 VITALS — BP 152/86 | HR 75 | Ht 63.0 in | Wt 188.6 lb

## 2018-07-25 DIAGNOSIS — E785 Hyperlipidemia, unspecified: Secondary | ICD-10-CM

## 2018-07-25 DIAGNOSIS — I493 Ventricular premature depolarization: Secondary | ICD-10-CM

## 2018-07-25 DIAGNOSIS — R002 Palpitations: Secondary | ICD-10-CM

## 2018-07-25 DIAGNOSIS — R000 Tachycardia, unspecified: Secondary | ICD-10-CM

## 2018-07-25 LAB — MAGNESIUM: Magnesium: 2.2 mg/dL (ref 1.6–2.3)

## 2018-07-25 LAB — BASIC METABOLIC PANEL
BUN/Creatinine Ratio: 16 (ref 12–28)
BUN: 16 mg/dL (ref 8–27)
CO2: 23 mmol/L (ref 20–29)
Calcium: 9.4 mg/dL (ref 8.7–10.3)
Chloride: 102 mmol/L (ref 96–106)
Creatinine, Ser: 1 mg/dL (ref 0.57–1.00)
GFR calc Af Amer: 65 mL/min/{1.73_m2} (ref >59)
GFR calc non Af Amer: 56 mL/min/{1.73_m2} — ABNORMAL LOW (ref >59)
Glucose: 90 mg/dL (ref 65–99)
Potassium: 4.7 mmol/L (ref 3.5–5.2)
Sodium: 141 mmol/L (ref 134–144)

## 2018-07-25 LAB — CBC
Hematocrit: 39.4 % (ref 34.0–46.6)
Hemoglobin: 12.9 g/dL (ref 11.1–15.9)
MCH: 29.1 pg (ref 26.6–33.0)
MCHC: 32.7 g/dL (ref 31.5–35.7)
MCV: 89 fL (ref 79–97)
Platelets: 263 10*3/uL (ref 150–450)
RBC: 4.43 x10E6/uL (ref 3.77–5.28)
RDW: 13.6 % (ref 12.3–15.4)
WBC: 6.4 10*3/uL (ref 3.4–10.8)

## 2018-07-25 LAB — LIPID PANEL
Chol/HDL Ratio: 2.7 ratio (ref 0.0–4.4)
Cholesterol, Total: 201 mg/dL — ABNORMAL HIGH (ref 100–199)
HDL: 75 mg/dL (ref >39)
LDL Calculated: 109 mg/dL — ABNORMAL HIGH (ref 0–99)
Triglycerides: 83 mg/dL (ref 0–149)
VLDL Cholesterol Cal: 17 mg/dL (ref 5–40)

## 2018-07-25 NOTE — Patient Instructions (Signed)
Medication Instructions:  Your physician recommends that you continue on your current medications as directed. Please refer to the Current Medication list given to you today.  Labwork: You will have labs drawn today: CBC, BMP, Fasting Lipids, Mg   Testing/Procedures: Your physician has requested that you have an echocardiogram. Echocardiography is a painless test that uses sound waves to create images of your heart. It provides your doctor with information about the size and shape of your heart and how well your heart's chambers and valves are working. This procedure takes approximately one hour. There are no restrictions for this procedure.  Please schedule this for 1 week prior to your appointment with Tommye Standard, PA  Follow-Up: Your physician recommends that you schedule a follow-up appointment in:  6 months with Tommye Standard  Any Other Special Instructions Will Be Listed Below (If Applicable).     If you need a refill on your cardiac medications before your next appointment, please call your pharmacy.

## 2018-07-25 NOTE — Progress Notes (Signed)
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 female who presents today for routine electrophysiology followup.  Since last being seen in our clinic, the patient reports doing very well.  Her primary concern is with back and hip pain.  She has been less active.  Continues to have SOB with moderate activity. + venous insufficiency/ ankle swelling. Today, she denies symptoms of palpitations, chest pain,   dizziness, presyncope, or syncope.  The patient is otherwise without complaint today.   Past Medical History:  Diagnosis Date  . Colon polyps   . Esophageal spasm   . Fatigue   . Frequent PVCs   . GERD (gastroesophageal reflux disease)   . IBS (irritable bowel syndrome)   . Palpitations   . Recurrent UTI   . Seborrheic dermatitis of scalp 03/06/13  . Vitamin B 12 deficiency    Past Surgical History:  Procedure Laterality Date  . CHOLECYSTECTOMY N/A 08/23/2016   Procedure: LAPAROSCOPIC CHOLECYSTECTOMY;  Surgeon: Rolm Bookbinder, MD;  Location: [facility];  Service: General;  Laterality: N/A;  . COLECTOMY Right 1998   large colonic polyp  . ENDOSCOPIC VEIN LASER TREATMENT Bilateral 2010-2011  . [place]  2001  . SPINE SURGERY     3 since 11/2010, laminoectomy, lumbar fusion, diskectomy    ROS- all systems are reviewed and negatives except as per HPI above  Current Outpatient Medications  Medication Sig Dispense Refill  . betamethasone dipropionate 8.85 % lotion 1 application topical daily    . calcium carbonate (TUMS CALCIUM FOR LIFE BONE) 750 MG chewable tablet Chew 1 tablet by mouth daily.     . Cholecalciferol (VITAMIN D3) 2000 units TABS Take 1 tablet by mouth daily.    . Ciclopirox 1 % shampoo Apply 1 application topically daily as needed (itching).     . disopyramide (NORPACE) 150 MG capsule Take 150 mg by mouth daily.    . fexofenadine (ALLEGRA) 60 MG tablet Take 60 mg by mouth daily.    . fluticasone (FLONASE) 50 MCG/ACT nasal spray  Place 2 sprays into the nose daily as needed for allergies or rhinitis.     . Glucosamine-Chondroitin (COSAMIN DS PO) Take 2 capsules by mouth daily.    . hyoscyamine (LEVSIN) 0.125 MG/5ML ELIX Take 0.125 mg by mouth every 30 (thirty) days.    . pantoprazole (PROTONIX) 40 MG tablet TAKE 1 TABLET(40 MG) BY MOUTH DAILY 90 tablet 1  . Polyvinyl Alcohol-Povidone (FRESHKOTE OP) Apply to eye daily.    . ranitidine (ZANTAC) 150 MG tablet TAKE 1 TABLET BY MOUTH TWICE DAILY 180 tablet 1  . Sodium Chloride, Hypertonic, (MURO 128 OP) Apply to eye daily.    Marland Kitchen tretinoin (RETIN-A) 0.025 % cream Apply 1 application topically every other day.  0   No current facility-administered medications for this visit.     Physical Exam: Vitals:   07/25/18 0949  BP: (!) 152/86  Pulse: 75  SpO2: 99%  Weight: 188 lb 9.6 oz (85.5 kg)  Height: 5\' 3"  (1.6 m)    GEN- The patient is well appearing, alert and oriented x 3 today.   Head- normocephalic, atraumatic Eyes-  Sclera clear, conjunctiva pink Ears- hearing intact Oropharynx- clear Lungs- Clear to ausculation bilaterally, normal work of breathing Heart- Regular rate and rhythm, no murmurs, rubs or gallops, PMI not laterally displaced GI- soft, NT, ND, + BS Extremities- no clubbing, cyanosis, trace edema  Wt Readings from Last  3 Encounters:  07/25/18 188 lb 9.6 oz (85.5 kg)  03/22/18 185 lb (83.9 kg)  03/14/18 185 lb (83.9 kg)    EKG tracing ordered today is personally reviewed and shows sinus rhythm, IVCD (QRS 114 msec)  Assessment and Plan:  1. PVCs Well controlled with norpace daily  2. HTN Elevated today,  Records from home reveal good bp control Bmet, lipids today No change required today  3. SOB/ pulmonary hypertension Unclear etiology Echo 4/19 reviewed with patient Low risk myoview 4/19 also reviewed I have advised weight loss, regular exercise as well as PFTs, sleep study to evaluate pulmonary htn.  She prefers to try weight loss  without additional testing currently.  Repeat echo in 6 months.  If she still has pulmonary htn, she may be more willing to consider sleep study/ pfts then  Return to see EP PA in 6 months  Thompson Grayer MD, [facility] 07/25/2018 10:09 AM

## 2018-07-26 DIAGNOSIS — L821 Other seborrheic keratosis: Secondary | ICD-10-CM | POA: Diagnosis not present

## 2018-07-26 DIAGNOSIS — D1801 Hemangioma of skin and subcutaneous tissue: Secondary | ICD-10-CM | POA: Diagnosis not present

## 2018-07-26 DIAGNOSIS — Z85828 Personal history of other malignant neoplasm of skin: Secondary | ICD-10-CM | POA: Diagnosis not present

## 2018-07-26 DIAGNOSIS — L218 Other seborrheic dermatitis: Secondary | ICD-10-CM | POA: Diagnosis not present

## 2018-08-14 DIAGNOSIS — M4726 Other spondylosis with radiculopathy, lumbar region: Secondary | ICD-10-CM | POA: Diagnosis not present

## 2018-08-14 DIAGNOSIS — M5136 Other intervertebral disc degeneration, lumbar region: Secondary | ICD-10-CM | POA: Diagnosis not present

## 2018-08-14 DIAGNOSIS — M545 Low back pain: Secondary | ICD-10-CM | POA: Diagnosis not present

## 2018-08-14 DIAGNOSIS — Z6832 Body mass index (BMI) 32.0-32.9, adult: Secondary | ICD-10-CM | POA: Diagnosis not present

## 2018-08-14 DIAGNOSIS — M546 Pain in thoracic spine: Secondary | ICD-10-CM | POA: Diagnosis not present

## 2018-08-14 DIAGNOSIS — G8929 Other chronic pain: Secondary | ICD-10-CM | POA: Diagnosis not present

## 2018-08-14 DIAGNOSIS — R030 Elevated blood-pressure reading, without diagnosis of hypertension: Secondary | ICD-10-CM | POA: Diagnosis not present

## 2018-08-14 DIAGNOSIS — M47816 Spondylosis without myelopathy or radiculopathy, lumbar region: Secondary | ICD-10-CM | POA: Diagnosis not present

## 2018-08-22 DIAGNOSIS — M25551 Pain in right hip: Secondary | ICD-10-CM | POA: Diagnosis not present

## 2018-09-22 DIAGNOSIS — Z23 Encounter for immunization: Secondary | ICD-10-CM | POA: Diagnosis not present

## 2018-10-04 ENCOUNTER — Other Ambulatory Visit: Payer: Self-pay | Admitting: Internal Medicine

## 2018-10-04 DIAGNOSIS — Z1231 Encounter for screening mammogram for malignant neoplasm of breast: Secondary | ICD-10-CM

## 2018-11-07 DIAGNOSIS — M25551 Pain in right hip: Secondary | ICD-10-CM | POA: Diagnosis not present

## 2018-11-07 DIAGNOSIS — M1712 Unilateral primary osteoarthritis, left knee: Secondary | ICD-10-CM | POA: Diagnosis not present

## 2018-11-07 DIAGNOSIS — M1611 Unilateral primary osteoarthritis, right hip: Secondary | ICD-10-CM | POA: Diagnosis not present

## 2018-11-07 DIAGNOSIS — M25562 Pain in left knee: Secondary | ICD-10-CM | POA: Diagnosis not present

## 2018-11-19 ENCOUNTER — Ambulatory Visit
Admission: RE | Admit: 2018-11-19 | Discharge: 2018-11-19 | Disposition: A | Discharge status: Home / Self Care | Payer: Medicare Other | Source: Ambulatory Visit | Attending: Internal Medicine | Admitting: Internal Medicine

## 2018-11-19 DIAGNOSIS — Z1231 Encounter for screening mammogram for malignant neoplasm of breast: Secondary | ICD-10-CM

## 2018-11-19 LAB — HM MAMMOGRAPHY

## 2018-11-20 DIAGNOSIS — M25562 Pain in left knee: Secondary | ICD-10-CM | POA: Diagnosis not present

## 2018-11-25 DIAGNOSIS — J069 Acute upper respiratory infection, unspecified: Secondary | ICD-10-CM | POA: Diagnosis not present

## 2018-11-29 DIAGNOSIS — M25562 Pain in left knee: Secondary | ICD-10-CM | POA: Diagnosis not present

## 2018-12-06 DIAGNOSIS — S83242D Other tear of medial meniscus, current injury, left knee, subsequent encounter: Secondary | ICD-10-CM | POA: Diagnosis not present

## 2018-12-06 DIAGNOSIS — M25562 Pain in left knee: Secondary | ICD-10-CM | POA: Diagnosis not present

## 2018-12-18 DIAGNOSIS — M23322 Other meniscus derangements, posterior horn of medial meniscus, left knee: Secondary | ICD-10-CM | POA: Diagnosis not present

## 2018-12-18 DIAGNOSIS — M948X6 Other specified disorders of cartilage, lower leg: Secondary | ICD-10-CM | POA: Diagnosis not present

## 2018-12-18 DIAGNOSIS — G8918 Other acute postprocedural pain: Secondary | ICD-10-CM | POA: Diagnosis not present

## 2019-01-19 NOTE — Progress Notes (Signed)
Electrophysiology Office Note Date: 01/25/2019  ID:  Carrie [name], DOB 12/20/1945, MRN [id number]  PCP: Janith Lima, MD Electrophysiologist: Rayann Heman  CC: palpitations follow up  Carrie [name] is a 73 y.o. female seen today for Dr Rayann Heman.  She presents today for routine electrophysiology followup.  Since last being seen in our clinic, the patient reports doing relatively well.  She had knee surgery and is having inflammation post surgery which is requiring that she be non weight bearing for 3 weeks. Her knee troubles have limited her ability to be active. She notices PVC's at night but the Norpace controls them during the day. She has occasional LE edema.  She denies chest pain, dyspnea, PND, orthopnea, nausea, vomiting, dizziness, syncope, weight gain, or early satiety.  Past Medical History:  Diagnosis Date  . Colon polyps   . Esophageal spasm   . Fatigue   . Frequent PVCs   . GERD (gastroesophageal reflux disease)   . IBS (irritable bowel syndrome)   . Palpitations   . Recurrent UTI   . Seborrheic dermatitis of scalp 03/06/13  . Vitamin B 12 deficiency    Past Surgical History:  Procedure Laterality Date  . CHOLECYSTECTOMY N/A 08/23/2016   Procedure: LAPAROSCOPIC CHOLECYSTECTOMY;  Surgeon: Rolm Bookbinder, MD;  Location: [facility];  Service: General;  Laterality: N/A;  . COLECTOMY Right 1998   large colonic polyp  . ENDOSCOPIC VEIN LASER TREATMENT Bilateral 2010-2011  . [place]  2001  . SPINE SURGERY     3 since 11/2010, laminoectomy, lumbar fusion, diskectomy    Current Outpatient Medications  Medication Sig Dispense Refill  . betamethasone dipropionate 3.29 % lotion 1 application topical daily    . calcium carbonate (TUMS CALCIUM FOR LIFE BONE) 750 MG chewable tablet Chew 1 tablet by mouth daily.     . Cholecalciferol (VITAMIN D3) 2000 units TABS Take 1 tablet by mouth daily.    . Ciclopirox 1 % shampoo Apply 1 application topically daily as needed  (itching).     . disopyramide (NORPACE) 150 MG capsule Take 1 capsule (150 mg total) by mouth daily. 90 capsule 1  . fluticasone (FLONASE) 50 MCG/ACT nasal spray Place 2 sprays into the nose daily as needed for allergies or rhinitis.     . pantoprazole (PROTONIX) 40 MG tablet TAKE 1 TABLET(40 MG) BY MOUTH DAILY 90 tablet 1  . Polyvinyl Alcohol-Povidone (FRESHKOTE OP) Apply to eye daily.    . Sodium Chloride, Hypertonic, (MURO 128 OP) Apply to eye daily.    Marland Kitchen tretinoin (RETIN-A) 0.025 % cream Apply 1 application topically every other day.  0   No current facility-administered medications for this visit.     Allergies:   Phenergan [promethazine hcl]; Erythromycin; Erythromycin base; Azithromycin; Epinephrine; and Nitrofurantoin   Social History: Social History   Socioeconomic History  . Marital status: Married    Spouse name: Not on file  . Number of children: 2  . Years of education: 24  . Highest education level: Not on file  Occupational History  . Occupation: Retired Animal nutritionist  . Financial resource strain: Not on file  . Food insecurity:    Worry: Not on file    Inability: Not on file  . Transportation needs:    Medical: Not on file    Non-medical: Not on file  Tobacco Use  . Smoking status: Former Smoker    Last attempt to quit: 02/02/2013    Years since quitting:  5.9  . Smokeless tobacco: Never Used  Substance and Sexual Activity  . Alcohol use: Yes    Alcohol/week: 3.0 standard drinks    Types: 3 Glasses of wine per week    Comment: 1-2 glass of wine per week  . Drug use: No  . Sexual activity: Yes  Lifestyle  . Physical activity:    Days per week: Not on file    Minutes per session: Not on file  . Stress: Not on file  Relationships  . Social connections:    Talks on phone: Not on file    Gets together: Not on file    Attends religious service: Not on file    Active member of club or organization: Not on file    Attends meetings of clubs or  organizations: Not on file    Relationship status: Not on file  . Intimate partner violence:    Fear of current or ex partner: Not on file    Emotionally abused: Not on file    Physically abused: Not on file    Forced sexual activity: Not on file  Other Topics Concern  . Not on file  Social History Narrative   Lives in [place] with spouse.   Volunteers as a Contractor   Caffeine use: none, quit 25 years ago    Family History: Family History  Problem Relation Age of Onset  . Hypertension Mother   . Cancer Father   . Hypertension Sister   . Hypertension Sister   . Hypertension Sister   . Cancer Sister        bladder  . Early death Neg Hx   . Diabetes Neg Hx   . Heart disease Neg Hx   . Kidney disease Neg Hx   . Stroke Neg Hx   . Breast cancer Neg Hx     Review of Systems: All other systems reviewed and are otherwise negative except as noted above.   Physical Exam: VS:  BP (!) 146/84   Pulse 77   Ht 5' 3.5" (1.613 m)   Wt 188 lb 8 oz (85.5 kg)   BMI 32.87 kg/m  , BMI Body mass index is 32.87 kg/m. Wt Readings from Last 3 Encounters:  01/25/19 188 lb 8 oz (85.5 kg)  07/25/18 188 lb 9.6 oz (85.5 kg)  03/22/18 185 lb (83.9 kg)    GEN- The patient is well appearing, alert and oriented x 3 today.   HEENT: normocephalic, atraumatic; sclera clear, conjunctiva pink; hearing intact; oropharynx clear; neck supple, no JVP Lymph- no cervical lymphadenopathy Lungs- Clear to ausculation bilaterally, normal work of breathing.  No wheezes, rales, rhonchi Heart- Regular rate and rhythm  GI- soft, non-tender, non-distended, bowel sounds present  Extremities- no clubbing, cyanosis, trace BLE edema MS- no significant deformity or atrophy Skin- warm and dry, no rash or lesion  Psych- euthymic mood, full affect Neuro- strength and sensation are intact   EKG:  EKG is ordered today. The ekg ordered today shows sinus rhythm, rate 77  Recent Labs: 07/25/2018: BUN  16; Creatinine, Ser 1.00; Hemoglobin 12.9; Magnesium 2.2; Platelets 263; Potassium 4.7; Sodium 141    Other studies Reviewed: Additional studies/ records that were reviewed today include: Dr Jackalyn Lombard office notes  Assessment and Plan:  1.  PVC's Stable on Norpace EKG stable today  2.  HTN BP stable at home  3.  Shortness of breath/pulmonary hypertension Echo stable (reviewed by Dr Rayann Heman). Results reviewed with patient today She  has not had issues with shortness of breath recently.  I have advised her to let us know if she has symptoms after starting exercising again.    Current medicines are reviewed at length with the patient today.   The patient does not have concerns regarding her medicines.  The following changes were made today:  none  Labs/ tests ordered today include:   Orders Placed This Encounter  Procedures  . EKG 12-Lead     Disposition:   Follow up with Dr Rayann Heman in 6 months      Signed, Chanetta Marshall, NP 01/25/2019 10:40 AM   [address] [place] [address] [phone] (office) [phone] (fax)

## 2019-01-22 ENCOUNTER — Ambulatory Visit (HOSPITAL_COMMUNITY): Payer: Medicare Other | Attending: Internal Medicine

## 2019-01-22 DIAGNOSIS — R002 Palpitations: Secondary | ICD-10-CM | POA: Diagnosis not present

## 2019-01-22 DIAGNOSIS — R000 Tachycardia, unspecified: Secondary | ICD-10-CM

## 2019-01-22 DIAGNOSIS — I493 Ventricular premature depolarization: Secondary | ICD-10-CM | POA: Diagnosis not present

## 2019-01-22 DIAGNOSIS — R931 Abnormal findings on diagnostic imaging of heart and coronary circulation: Secondary | ICD-10-CM | POA: Diagnosis not present

## 2019-01-25 ENCOUNTER — Ambulatory Visit (INDEPENDENT_AMBULATORY_CARE_PROVIDER_SITE_OTHER): Payer: Medicare Other | Admitting: Nurse Practitioner

## 2019-01-25 VITALS — BP 146/84 | HR 77 | Ht 63.5 in | Wt 188.5 lb

## 2019-01-25 DIAGNOSIS — R0602 Shortness of breath: Secondary | ICD-10-CM

## 2019-01-25 DIAGNOSIS — I493 Ventricular premature depolarization: Secondary | ICD-10-CM | POA: Diagnosis not present

## 2019-01-25 DIAGNOSIS — I10 Essential (primary) hypertension: Secondary | ICD-10-CM | POA: Diagnosis not present

## 2019-01-25 MED ORDER — DISOPYRAMIDE PHOSPHATE 150 MG PO CAPS: 150.0000 mg | ORAL_CAPSULE | Freq: Every day | ORAL | 1 refills | Status: DC

## 2019-01-25 NOTE — Patient Instructions (Signed)
Medication Instructions:   Your physician recommends that you continue on your current medications as directed. Please refer to the Current Medication list given to you today.  If you need a refill on your cardiac medications before your next appointment, please call your pharmacy.   Lab work: NONE ORDERED  TODAY   If you have labs (blood work) drawn today and your tests are completely normal, you will receive your results only by: Marland Kitchen MyChart Message (if you have MyChart) OR . A paper copy in the mail If you have any lab test that is abnormal or we need to change your treatment, we will call you to review the results.  Testing/Procedures:NONE ORDERED  TODAY    Follow-Up: Your physician wants you to follow-up in:  [place]  [place] [address] [place]   You will receive a reminder letter in the mail two months in advance. If you don't receive a letter, please call our office to schedule the follow-up appointment.         Any Other Special Instructions Will Be Listed Below (If Applicable).

## 2019-01-28 ENCOUNTER — Telehealth: Payer: Self-pay | Admitting: Internal Medicine

## 2019-01-28 NOTE — Telephone Encounter (Signed)
Ok to refill 

## 2019-01-28 NOTE — Telephone Encounter (Signed)
Pt's pharmacy [facility] requesting a new Rx for Disopyramide CR 150 mg tablet. This medication was not sent in. Pt stated that she should have Disopyramide CR 150 mg tablet. Please address

## 2019-01-29 MED ORDER — DISOPYRAMIDE PHOSPHATE 150 MG PO CAPS: 150.0000 mg | ORAL_CAPSULE | Freq: Every day | ORAL | 3 refills | Status: DC

## 2019-01-29 NOTE — Telephone Encounter (Signed)
Refilled

## 2019-02-04 ENCOUNTER — Other Ambulatory Visit: Payer: Self-pay | Admitting: *Deleted

## 2019-02-04 ENCOUNTER — Other Ambulatory Visit: Payer: Self-pay

## 2019-02-04 ENCOUNTER — Encounter: Payer: Self-pay | Admitting: *Deleted

## 2019-02-04 MED ORDER — DISOPYRAMIDE PHOSPHATE ER 150 MG PO CP12: 150.0000 mg | ORAL_CAPSULE | Freq: Every day | ORAL | 3 refills | Status: DC

## 2019-02-04 NOTE — Telephone Encounter (Signed)
SPOKE WITH PT WHO NEED RX TO BE SENT IN FOR BRAND NAME NORPACE 150 CR  AND NOT GENERIC  RX SENT IN TO PHARMACY TO FILL IN BRAND NAME NOT GENERIC

## 2019-02-04 NOTE — Addendum Note (Signed)
Addended by: Willeen Cass A on: 02/04/2019 09:31 AM   Modules accepted: Orders

## 2019-02-05 ENCOUNTER — Other Ambulatory Visit: Payer: Self-pay | Admitting: Internal Medicine

## 2019-02-13 DIAGNOSIS — M25562 Pain in left knee: Secondary | ICD-10-CM | POA: Diagnosis not present

## 2019-03-14 ENCOUNTER — Encounter: Payer: Medicare Other | Admitting: Internal Medicine

## 2019-03-19 DIAGNOSIS — M25562 Pain in left knee: Secondary | ICD-10-CM | POA: Diagnosis not present

## 2019-03-19 DIAGNOSIS — S82145D Nondisplaced bicondylar fracture of left tibia, subsequent encounter for closed fracture with routine healing: Secondary | ICD-10-CM | POA: Diagnosis not present

## 2019-04-11 ENCOUNTER — Ambulatory Visit: Payer: Medicare Other | Admitting: Internal Medicine

## 2019-04-11 ENCOUNTER — Ambulatory Visit (INDEPENDENT_AMBULATORY_CARE_PROVIDER_SITE_OTHER): Payer: Medicare Other | Admitting: Internal Medicine

## 2019-04-11 ENCOUNTER — Other Ambulatory Visit: Payer: Self-pay

## 2019-04-11 ENCOUNTER — Encounter: Payer: Self-pay | Admitting: Internal Medicine

## 2019-04-11 DIAGNOSIS — J0100 Acute maxillary sinusitis, unspecified: Secondary | ICD-10-CM | POA: Diagnosis not present

## 2019-04-11 DIAGNOSIS — H6982 Other specified disorders of Eustachian tube, left ear: Secondary | ICD-10-CM

## 2019-04-11 DIAGNOSIS — H8113 Benign paroxysmal vertigo, bilateral: Secondary | ICD-10-CM

## 2019-04-11 DIAGNOSIS — J301 Allergic rhinitis due to pollen: Secondary | ICD-10-CM | POA: Diagnosis not present

## 2019-04-11 DIAGNOSIS — H6992 Unspecified Eustachian tube disorder, left ear: Secondary | ICD-10-CM | POA: Insufficient documentation

## 2019-04-11 HISTORY — DX: Benign paroxysmal vertigo, bilateral: H81.13

## 2019-04-11 MED ORDER — FEXOFENADINE HCL 60 MG PO TABS: 60.0000 mg | ORAL_TABLET | Freq: Two times a day (BID) | ORAL | 1 refills | Status: DC

## 2019-04-11 MED ORDER — CEFDINIR 300 MG PO CAPS: 300.0000 mg | ORAL_CAPSULE | Freq: Two times a day (BID) | ORAL | 0 refills | Status: AC

## 2019-04-11 MED ORDER — MECLIZINE HCL 12.5 MG PO TABS: 12.5000 mg | ORAL_TABLET | Freq: Three times a day (TID) | ORAL | 2 refills | Status: DC | PRN

## 2019-04-11 MED ORDER — FLUTICASONE PROPIONATE 50 MCG/ACT NA SUSP: 2.0000 | Freq: Every day | NASAL | 1 refills | Status: DC

## 2019-04-11 MED ORDER — MECLIZINE HCL 25 MG PO TABS: 25.0000 mg | ORAL_TABLET | Freq: Three times a day (TID) | ORAL | 3 refills | Status: DC | PRN

## 2019-04-11 NOTE — Progress Notes (Signed)
Virtual Visit via Video Note  I connected with Carrie [name] on 04/11/19 at  1:00 PM EDT by a video enabled telemedicine application and verified that I am speaking with the correct person using two identifiers.   I discussed the limitations of evaluation and management by telemedicine and the availability of in person appointments. The patient expressed understanding and agreed to proceed.  History of Present Illness: She checked in for a virtual visit.  She was not willing to come in due to the COVID-19 pandemic.  She complains of a several week history of dizziness and vertigo.  She has had vertigo before.  This episode is a little more bothersome than her prior episodes.  She complains that when she lays down in the bed and turns her head from side to side she feels like the world is spinning around her.  She also complains of left facial pain, runny nose, nasal congestion, popping and crackling in her ears, and postnasal drip.  She has been using meclizine, Allegra, and Flonase with minimal improvement in her symptoms.  She denies headache, nausea, vomiting, ataxia, paresthesias, or syncope.  She also denies fever, chills, rash, cough, or shortness of breath.    Observations/Objective: Normal speech.  No evidence of distress.  She was calm, cooperative, and appropriate.  No facial swelling or asymmetry.  Lab Results  Component Value Date   WBC 6.4 07/25/2018   HGB 12.9 07/25/2018   HCT 39.4 07/25/2018   PLT 263 07/25/2018   GLUCOSE 90 07/25/2018   CHOL 201 (H) 07/25/2018   TRIG 83 07/25/2018   HDL 75 07/25/2018   LDLCALC 109 (H) 07/25/2018   ALT 22 08/22/2016   AST 23 08/22/2016   NA 141 07/25/2018   K 4.7 07/25/2018   CL 102 07/25/2018   CREATININE 1.00 07/25/2018   BUN 16 07/25/2018   CO2 23 07/25/2018   TSH 1.58 09/21/2017     Assessment and Plan: I think she is having a flare of allergic rhinitis with eustachian tube dysfunction and an exacerbation of vertigo.  She was not  willing to take a course of steroids or to use a decongestant.  I have asked her to be diligent with the current regimen of meclizine, Allegra, and Flonase.  I will treat the sinus infection with a broad-spectrum antibiotic.  She was encouraged to google Epley maneuvers to relieve the symptoms.   Follow Up Instructions: She agrees to comply with the above recommendations.  She will let me know if she develops any new or worsening symptoms.    I discussed the assessment and treatment plan with the patient. The patient was provided an opportunity to ask questions and all were answered. The patient agreed with the plan and demonstrated an understanding of the instructions.   The patient was advised to call back or seek an in-person evaluation if the symptoms worsen or if the condition fails to improve as anticipated.  I provided 25 minutes of non-face-to-face time during this encounter.   Scarlette Calico, MD

## 2019-05-01 ENCOUNTER — Ambulatory Visit (INDEPENDENT_AMBULATORY_CARE_PROVIDER_SITE_OTHER): Payer: Medicare Other | Admitting: Internal Medicine

## 2019-05-01 ENCOUNTER — Other Ambulatory Visit: Payer: Self-pay

## 2019-05-01 ENCOUNTER — Encounter: Payer: Self-pay | Admitting: Internal Medicine

## 2019-05-01 ENCOUNTER — Other Ambulatory Visit (INDEPENDENT_AMBULATORY_CARE_PROVIDER_SITE_OTHER): Payer: Medicare Other

## 2019-05-01 VITALS — BP 148/86 | HR 92 | Temp 98.2°F | Resp 16 | Ht 63.5 in | Wt 187.0 lb

## 2019-05-01 DIAGNOSIS — N183 Chronic kidney disease, stage 3 unspecified: Secondary | ICD-10-CM

## 2019-05-01 DIAGNOSIS — E559 Vitamin D deficiency, unspecified: Secondary | ICD-10-CM

## 2019-05-01 DIAGNOSIS — G3281 Cerebellar ataxia in diseases classified elsewhere: Secondary | ICD-10-CM | POA: Diagnosis not present

## 2019-05-01 DIAGNOSIS — R270 Ataxia, unspecified: Secondary | ICD-10-CM | POA: Diagnosis not present

## 2019-05-01 DIAGNOSIS — I10 Essential (primary) hypertension: Secondary | ICD-10-CM

## 2019-05-01 DIAGNOSIS — H8113 Benign paroxysmal vertigo, bilateral: Secondary | ICD-10-CM | POA: Diagnosis not present

## 2019-05-01 LAB — COMPREHENSIVE METABOLIC PANEL
ALT: 18 U/L (ref 0–35)
AST: 17 U/L (ref 0–37)
Albumin: 4.3 g/dL (ref 3.5–5.2)
Alkaline Phosphatase: 63 U/L (ref 39–117)
BUN: 25 mg/dL — ABNORMAL HIGH (ref 6–23)
CO2: 29 mEq/L (ref 19–32)
Calcium: 9.3 mg/dL (ref 8.4–10.5)
Chloride: 102 mEq/L (ref 96–112)
Creatinine, Ser: 1.02 mg/dL (ref 0.40–1.20)
GFR: 52.95 mL/min — ABNORMAL LOW (ref >60.00)
Glucose, Bld: 91 mg/dL (ref 70–99)
Potassium: 4.3 mEq/L (ref 3.5–5.1)
Sodium: 139 mEq/L (ref 135–145)
Total Bilirubin: 0.4 mg/dL (ref 0.2–1.2)
Total Protein: 7.1 g/dL (ref 6.0–8.3)

## 2019-05-01 LAB — VITAMIN D 25 HYDROXY (VIT D DEFICIENCY, FRACTURES): VITD: 34.32 ng/mL (ref 30.00–100.00)

## 2019-05-01 LAB — CBC WITH DIFFERENTIAL/PLATELET
Basophils Absolute: 0.1 10*3/uL (ref 0.0–0.1)
Basophils Relative: 1.4 % (ref 0.0–3.0)
Eosinophils Absolute: 0.2 10*3/uL (ref 0.0–0.7)
Eosinophils Relative: 2 % (ref 0.0–5.0)
HCT: 37.9 % (ref 36.0–46.0)
Hemoglobin: 12.9 g/dL (ref 12.0–15.0)
Lymphocytes Relative: 29 % (ref 12.0–46.0)
Lymphs Abs: 2.2 10*3/uL (ref 0.7–4.0)
MCHC: 34 g/dL (ref 30.0–36.0)
MCV: 88.4 fl (ref 78.0–100.0)
Monocytes Absolute: 0.6 10*3/uL (ref 0.1–1.0)
Monocytes Relative: 7.8 % (ref 3.0–12.0)
Neutro Abs: 4.5 10*3/uL (ref 1.4–7.7)
Neutrophils Relative %: 59.8 % (ref 43.0–77.0)
Platelets: 237 10*3/uL (ref 150.0–400.0)
RBC: 4.28 Mil/uL (ref 3.87–5.11)
RDW: 13.3 % (ref 11.5–15.5)
WBC: 7.5 10*3/uL (ref 4.0–10.5)

## 2019-05-01 LAB — VITAMIN B12: Vitamin B-12: 784 pg/mL (ref 211–911)

## 2019-05-01 LAB — FOLATE: Folate: 19 ng/mL (ref >5.9)

## 2019-05-01 MED ORDER — DIAZEPAM 2 MG PO TABS: 2.0000 mg | ORAL_TABLET | Freq: Three times a day (TID) | ORAL | 2 refills | Status: DC | PRN

## 2019-05-01 NOTE — Patient Instructions (Signed)
Vertigo    Vertigo means that you feel like you are moving when you are not. Vertigo can also make you feel like things around you are moving when they are not. This feeling can come and go at any time. Vertigo often goes away on its own.  Follow these instructions at home:  · Avoid making fast movements.  · Avoid driving.  · Avoid using heavy machinery.  · Avoid doing any task or activity that might cause danger to you or other people if you would have a vertigo attack while you are doing it.  · Sit down right away if you feel dizzy or have trouble with your balance.  · Take over-the-counter and prescription medicines only as told by your doctor.  · Follow instructions from your doctor about which positions or movements you should avoid.  · Drink enough fluid to keep your pee (urine) clear or pale yellow.  · Keep all follow-up visits as told by your doctor. This is important.  Contact a doctor if:  · Medicine does not help your vertigo.  · You have a fever.  · Your problems get worse or you have new symptoms.  · Your family or friends see changes in your behavior.  · You feel sick to your stomach (nauseous) or you throw up (vomit).  · You have a “pins and needles” feeling or you are numb in part of your body.  Get help right away if:  · You have trouble moving or talking.  · You are always dizzy.  · You pass out (faint).  · You get very bad headaches.  · You feel weak or have trouble using your hands, arms, or legs.  · You have changes in your hearing.  · You have changes in your seeing (vision).  · You get a stiff neck.  · Bright light starts to bother you.  This information is not intended to replace advice given to you by your health care provider. Make sure you discuss any questions you have with your health care provider.  Document Released: 08/30/2008 Document Revised: 04/28/2016 Document Reviewed: 03/16/2015  Elsevier Interactive Patient Education © 2019 [employer]

## 2019-05-01 NOTE — Progress Notes (Signed)
Subjective:  Patient ID: KAENA SANTORI, female    DOB: Jan 20, 1946  Age: 73 y.o. MRN: [id number]  CC: Hypertension   HPI TAMERRA MERKLEY presents for a BP check - Her blood pressure at home has been very well controlled and sometimes low.  She thinks most of her blood pressure issue is whitecoat phenomenon and she prefers not to take an antihypertensive.  She complains that over the last year she has had worsening episodes of dizziness and vertigo.  She tells me that the vertigo is worsened when she lays down in the bed and turns her head to the right.  She has also developed trouble walking but she thinks this is due to chronic knee pain.  She occasionally takes Tylenol and Advil for the knee discomfort.  She has tried meclizine for the vertigo but says it does not help much.  She has chronic numbness in both feet that was previously evaluated by a neurologist.  She also complains that she has developed a tremor in her left thumb over the last year.  Outpatient Medications Prior to Visit  Medication Sig Dispense Refill  . betamethasone dipropionate 2.97 % lotion 1 application topical daily    . calcium carbonate (TUMS CALCIUM FOR LIFE BONE) 750 MG chewable tablet Chew 1 tablet by mouth daily.     . Cholecalciferol (VITAMIN D3) 2000 units TABS Take 1 tablet by mouth daily.    . Ciclopirox 1 % shampoo Apply 1 application topically daily as needed (itching).     . disopyramide (NORPACE CR) 150 MG 12 hr capsule Take 1 capsule (150 mg total) by mouth daily. 90 capsule 3  . fexofenadine (ALLEGRA) 60 MG tablet Take 1 tablet (60 mg total) by mouth 2 (two) times daily. 180 tablet 1  . fluticasone (FLONASE) 50 MCG/ACT nasal spray Place 2 sprays into both nostrils daily. 48 g 1  . pantoprazole (PROTONIX) 40 MG tablet TAKE 1 TABLET(40 MG) BY MOUTH DAILY 90 tablet 1  . tretinoin (RETIN-A) 0.025 % cream Apply 1 application topically every other day.  0  . meclizine (ANTIVERT) 25 MG tablet Take 1 tablet (25  mg total) by mouth 3 (three) times daily as needed for dizziness. 90 tablet 3  . Polyvinyl Alcohol-Povidone (FRESHKOTE OP) Apply to eye daily.     No facility-administered medications prior to visit.     ROS Review of Systems  Constitutional: Negative.  Negative for diaphoresis and fatigue.  HENT: Negative.  Negative for trouble swallowing.   Eyes: Negative for visual disturbance.  Respiratory: Negative for cough, shortness of breath and wheezing.   Cardiovascular: Negative for chest pain, palpitations and leg swelling.  Gastrointestinal: Negative for abdominal pain, diarrhea, nausea and vomiting.  Genitourinary: Negative.  Negative for difficulty urinating.  Musculoskeletal: Positive for arthralgias and gait problem. Negative for joint swelling.  Neurological: Positive for dizziness, tremors and numbness. Negative for seizures, speech difficulty, weakness and light-headedness.  Hematological: Negative for adenopathy. Does not bruise/bleed easily.  Psychiatric/Behavioral: Negative.     Objective:  BP (!) 148/86 (BP Location: Left Arm, Patient Position: Sitting, Cuff Size: Normal)   Pulse 92   Temp 98.2 F (36.8 C) (Oral)   Resp 16   Ht 5' 3.5" (1.613 m)   Wt 187 lb (84.8 kg)   SpO2 99%   BMI 32.61 kg/m   BP Readings from Last 3 Encounters:  05/01/19 (!) 148/86  01/25/19 (!) 146/84  07/25/18 (!) 152/86    Wt Readings from  Last 3 Encounters:  05/01/19 187 lb (84.8 kg)  01/25/19 188 lb 8 oz (85.5 kg)  07/25/18 188 lb 9.6 oz (85.5 kg)    Physical Exam Constitutional:      Appearance: She is obese.  HENT:     Nose: Nose normal.     Mouth/Throat:     Mouth: Mucous membranes are moist.  Eyes:     General: No scleral icterus.       Right eye: No discharge.        Left eye: No discharge.     Extraocular Movements: Extraocular movements intact.     Right eye: Normal extraocular motion and no nystagmus.     Left eye: Normal extraocular motion and no nystagmus.      Pupils: Pupils are equal, round, and reactive to light.  Neck:     Musculoskeletal: Normal range of motion. No neck rigidity.  Cardiovascular:     Rate and Rhythm: Normal rate and regular rhythm.     Heart sounds: No murmur. No gallop.   Pulmonary:     Effort: Pulmonary effort is normal.     Breath sounds: No wheezing, rhonchi or rales.  Abdominal:     General: Abdomen is flat.     Palpations: There is no hepatomegaly or splenomegaly.     Tenderness: There is no abdominal tenderness. There is no guarding.  Musculoskeletal: Normal range of motion.        General: No swelling.     Right lower leg: No edema.     Left lower leg: No edema.  Lymphadenopathy:     Cervical: No cervical adenopathy.  Skin:    General: Skin is warm.     Findings: No rash.  Neurological:     Mental Status: She is alert.     Cranial Nerves: Cranial nerves are intact. No cranial nerve deficit, dysarthria or facial asymmetry.     Sensory: Sensation is intact.     Motor: Weakness present. No tremor, atrophy, abnormal muscle tone or seizure activity.     Coordination: Romberg sign positive. Coordination abnormal.     Deep Tendon Reflexes: Babinski sign absent on the right side.     Reflex Scores:      Tricep reflexes are 0 on the right side and 0 on the left side.      Bicep reflexes are 0 on the right side and 0 on the left side.      Brachioradialis reflexes are 0 on the right side and 0 on the left side.      Patellar reflexes are 0 on the right side and 0 on the left side.      Achilles reflexes are 0 on the right side and 0 on the left side.    Comments: There is mild symmetrical weakness in both lower extremities     Lab Results  Component Value Date   WBC 7.5 05/01/2019   HGB 12.9 05/01/2019   HCT 37.9 05/01/2019   PLT 237.0 05/01/2019   GLUCOSE 91 05/01/2019   CHOL 201 (H) 07/25/2018   TRIG 83 07/25/2018   HDL 75 07/25/2018   LDLCALC 109 (H) 07/25/2018   ALT 18 05/01/2019   AST 17 05/01/2019    NA 139 05/01/2019   K 4.3 05/01/2019   CL 102 05/01/2019   CREATININE 1.02 05/01/2019   BUN 25 (H) 05/01/2019   CO2 29 05/01/2019   TSH 1.58 09/21/2017    Mm 3d Screen Breast Bilateral  Result Date: 11/19/2018 CLINICAL DATA:  Screening. EXAM: DIGITAL SCREENING BILATERAL MAMMOGRAM WITH TOMO AND CAD COMPARISON:  Previous exam(s). ACR Breast Density Category c: The breast tissue is heterogeneously dense, which may obscure small masses. FINDINGS: There are no findings suspicious for malignancy. Images were processed with CAD. IMPRESSION: No mammographic evidence of malignancy. A result letter of this screening mammogram will be mailed directly to the patient. RECOMMENDATION: Screening mammogram in one year. (Code:SM-B-01Y) BI-RADS CATEGORY  1: Negative. Electronically Signed   By: Curlene Dolphin M.D.   On: 11/19/2018 16:23    Assessment & Plan:   Lella was seen today for hypertension.  Diagnoses and all orders for this visit:  Essential hypertension- Her blood pressure is not quite adequately well controlled but it appears that most of this is the whitecoat phenomenon and she prefers not to take an antihypertensive. -     CBC with Differential/Platelet; Future -     Comprehensive metabolic panel; Future  BPPV (benign paroxysmal positional vertigo), bilateral- Since she is not getting much symptom relief from the meclizine I have asked her to give low-dose Valium a try. -     diazepam (VALIUM) 2 MG tablet; Take 1 tablet (2 mg total) by mouth every 8 (eight) hours as needed for anxiety. -     MR Brain Wo Contrast; Future  Ataxia-I will screen her for thiamine and B12 deficiency.  I have also asked her to undergo an MRI of the brain to see if there is demyelination, a prior CVA, hydrocephalus, a tumor, or atrophy. -     Vitamin B1; Future -     Vitamin B12; Future -     Folate; Future -     MR Brain Wo Contrast; Future  Vitamin D deficiency disease -     VITAMIN D 25 Hydroxy (Vit-D  Deficiency, Fractures); Future  Cerebellar ataxia in diseases classified elsewhere [facility]) -     MR Brain Wo Contrast; Future  Chronic renal disease, stage 3, moderately decreased glomerular filtration rate (GFR) between 30-59 mL/min/1.73 square meter ([place])- She agrees to avoid nephrotoxic agents like NSAIDs.  She is not willing to take an antihypertensive.   I have discontinued Demisha Nokes. Latour's Polyvinyl Alcohol-Povidone (FRESHKOTE OP) and meclizine. I am also having her start on diazepam. Additionally, I am having her maintain her calcium carbonate, Ciclopirox, betamethasone dipropionate, tretinoin, Vitamin D3, disopyramide, pantoprazole, fluticasone, and fexofenadine.  Meds ordered this encounter  Medications  . diazepam (VALIUM) 2 MG tablet    Sig: Take 1 tablet (2 mg total) by mouth every 8 (eight) hours as needed for anxiety.    Dispense:  90 tablet    Refill:  2     Follow-up: Return in about 3 months (around 08/01/2019).  Scarlette Calico, MD

## 2019-05-02 ENCOUNTER — Telehealth: Payer: Self-pay

## 2019-05-02 NOTE — Telephone Encounter (Addendum)
Key: [id number]  PA approved.   Detailed vm informing of same.

## 2019-05-05 LAB — VITAMIN B1: Vitamin B1 (Thiamine): 11 nmol/L (ref 8–30)

## 2019-05-06 ENCOUNTER — Telehealth: Payer: Self-pay | Admitting: Internal Medicine

## 2019-05-06 NOTE — Telephone Encounter (Signed)
The [phone] stated that I had called [employer].   Called [phone] and lvm informing forms have been refaxed.

## 2019-05-06 NOTE — Telephone Encounter (Signed)
Caller name: Kellie Simmering  Relation to pt: [facility]  Call back number: [phone] and fax # [phone]   Reason for call:  [facility] Admission form faxed again on 04/29/2019 to [phone] requesting immediate attention. Forms re fax today, please advise     Copied from [place] [phone]. Topic: General - Inquiry >> Apr 25, 2019 10:03 AM Virl Axe D wrote: Reason for CRM: Almyra Free with [facility] faxed a medical assessment last week for Dr. Ronnald Ramp to fill out. She was following up to see if it was received and when it will be returned please a vise. MG#[phone] >> Apr 25, 2019  3:57 PM Cairrikier Dian Queen, CMA wrote: lvm for [facility] to resend order

## 2019-05-06 NOTE — Telephone Encounter (Signed)
Form has been refaxed.

## 2019-05-07 ENCOUNTER — Encounter: Payer: Self-pay | Admitting: Internal Medicine

## 2019-05-09 ENCOUNTER — Other Ambulatory Visit: Payer: Self-pay | Admitting: Internal Medicine

## 2019-05-09 ENCOUNTER — Telehealth: Payer: Self-pay

## 2019-05-09 DIAGNOSIS — K219 Gastro-esophageal reflux disease without esophagitis: Secondary | ICD-10-CM

## 2019-05-09 MED ORDER — FAMOTIDINE 40 MG PO TABS: 40.0000 mg | ORAL_TABLET | Freq: Every day | ORAL | 1 refills | Status: DC

## 2019-05-09 NOTE — Telephone Encounter (Signed)
Copied from [place] [phone]. Topic: General - Other >> May 08, 2019  3:22 PM Mcneil, Ja-Kwan wrote: Reason for CRM: Pt requests that Dr. Ronnald Ramp call her back regarding her last visit and her lab results.

## 2019-05-09 NOTE — Telephone Encounter (Signed)
Pt is concerned about the dx CKD stage 3. Pt would like to know if any of her medication are contributing to the decline in the GFR, if she needs to stop or start eating a certain food.   Pt has stopped the protonix and wanted to know if she can use pepcid.

## 2019-05-09 NOTE — Telephone Encounter (Signed)
Pt contacted and given PCP response and pt stated understanding.

## 2019-05-09 NOTE — Telephone Encounter (Signed)
Change protonix to pepcid RX sent Avoid NSAIDS I don't see any other meds that would cause kidney damage  TJ

## 2019-05-15 DIAGNOSIS — H10502 Unspecified blepharoconjunctivitis, left eye: Secondary | ICD-10-CM | POA: Diagnosis not present

## 2019-05-16 ENCOUNTER — Other Ambulatory Visit: Payer: Self-pay

## 2019-05-16 ENCOUNTER — Ambulatory Visit
Admission: RE | Admit: 2019-05-16 | Discharge: 2019-05-16 | Disposition: A | Discharge status: Home / Self Care | Payer: Medicare Other | Source: Ambulatory Visit | Attending: Internal Medicine | Admitting: Internal Medicine

## 2019-05-16 DIAGNOSIS — R270 Ataxia, unspecified: Secondary | ICD-10-CM | POA: Diagnosis not present

## 2019-05-16 DIAGNOSIS — H8113 Benign paroxysmal vertigo, bilateral: Secondary | ICD-10-CM

## 2019-05-16 DIAGNOSIS — G3281 Cerebellar ataxia in diseases classified elsewhere: Secondary | ICD-10-CM

## 2019-05-20 ENCOUNTER — Encounter: Payer: Self-pay | Admitting: Internal Medicine

## 2019-05-20 ENCOUNTER — Telehealth: Payer: Self-pay

## 2019-05-20 NOTE — Telephone Encounter (Signed)
Pt inquired about results of MRI. Please advise.  Pt informed PCP has been out of the office.

## 2019-05-21 ENCOUNTER — Encounter: Payer: Self-pay | Admitting: Internal Medicine

## 2019-05-21 DIAGNOSIS — M25562 Pain in left knee: Secondary | ICD-10-CM | POA: Diagnosis not present

## 2019-06-18 DIAGNOSIS — M25562 Pain in left knee: Secondary | ICD-10-CM | POA: Diagnosis not present

## 2019-06-18 DIAGNOSIS — M545 Low back pain: Secondary | ICD-10-CM | POA: Diagnosis not present

## 2019-06-19 DIAGNOSIS — M545 Low back pain: Secondary | ICD-10-CM | POA: Diagnosis not present

## 2019-06-25 DIAGNOSIS — M545 Low back pain: Secondary | ICD-10-CM | POA: Diagnosis not present

## 2019-07-01 DIAGNOSIS — M545 Low back pain: Secondary | ICD-10-CM | POA: Diagnosis not present

## 2019-07-03 DIAGNOSIS — M545 Low back pain: Secondary | ICD-10-CM | POA: Diagnosis not present

## 2019-07-08 DIAGNOSIS — M545 Low back pain: Secondary | ICD-10-CM | POA: Diagnosis not present

## 2019-07-11 DIAGNOSIS — M545 Low back pain: Secondary | ICD-10-CM | POA: Diagnosis not present

## 2019-07-15 DIAGNOSIS — M545 Low back pain: Secondary | ICD-10-CM | POA: Diagnosis not present

## 2019-07-18 DIAGNOSIS — M545 Low back pain: Secondary | ICD-10-CM | POA: Diagnosis not present

## 2019-07-22 DIAGNOSIS — M545 Low back pain: Secondary | ICD-10-CM | POA: Diagnosis not present

## 2019-07-25 DIAGNOSIS — M545 Low back pain: Secondary | ICD-10-CM | POA: Diagnosis not present

## 2019-07-27 NOTE — Progress Notes (Signed)
Cardiology Office Note Date:  07/29/2019  Patient ID:  Carrie [name], Carrie [name] Feb 28, 1946, MRN [id number] PCP:  Janith Lima, MD  Electrophysiologist:  Dr. Rayann Heman    Chief Complaint:  6 month f/u   History of Present Illness: Carrie [name] is a 73 y.o. female with history of GERD, IBS, esophageal spasm, h/o SOB w/p.HTN,  PVCs.  She comes in today to be seen for Dr. Rayann Heman.  She was last seen by EP service in Feb 2020 by A. Lynnell Jude, NP.  At that time she was having post-op knee pain/inflammation, was non-weight bearing.  From PVC perspective she was having palpitations at night, none during the day, felt her Norpace was effective and was unchanged.  She has had a tough year since her Knee surgery and prolonged recovery including months of non-weight bearing.  This has really gotten her quite deconditioned and has as well exacerbated her chronic back issues that has further limited her ability to get her back to baseline activities.  During this she has noted generally her BP somewhat low, often SBP 90's-low 100 range.  A few now and then when she checks are higher/high towards 150.  But these are few.  She was also plagued with vertigo, and with PT and the head exercises/maneuvers this has for the most part resolved. She has h/o vein surgery remotely in the past both legs and for quite a while getting injections for them, has known venus insufficiency  We discussed importance of standing slowly, monitoring for positional dizziness.  She has had some of this, no near syncope or syncope. No CP or SOB.  She will feel fleeting palpitations usually evenings/late day.  Unchanged.  She is mostly frustrated with her slow progress with re-strengthening, inability to get back to her level of physical ability since her knee surgery and limitations bnow with her back.  She inquires about a diagnosis of CKD (III) when looking ather MyChart.  She is referred to her PMD for further discussion, though  discussed her Creat does not appear to have changed to any significant degree for a couple years.   PVCs AAD:  2015, Norpace (current)  Past Medical History:  Diagnosis Date  . Colon polyps   . Esophageal spasm   . Fatigue   . Frequent PVCs   . GERD (gastroesophageal reflux disease)   . IBS (irritable bowel syndrome)   . Palpitations   . Recurrent UTI   . Seborrheic dermatitis of scalp 03/06/13  . Vitamin B 12 deficiency     Past Surgical History:  Procedure Laterality Date  . CHOLECYSTECTOMY N/A 08/23/2016   Procedure: LAPAROSCOPIC CHOLECYSTECTOMY;  Surgeon: Rolm Bookbinder, MD;  Location: [facility] ORS;  Service: General;  Laterality: N/A;  . COLECTOMY Right 1998   large colonic polyp  . ENDOSCOPIC VEIN LASER TREATMENT Bilateral 2010-2011  . [place]  2001  . SPINE SURGERY     3 since 11/2010, laminoectomy, lumbar fusion, diskectomy    Current Outpatient Medications  Medication Sig Dispense Refill  . betamethasone dipropionate [id number] % lotion 1 application topical daily    . calcium carbonate (TUMS CALCIUM FOR LIFE BONE) 750 MG chewable tablet Chew 1 tablet by mouth daily.     . Cholecalciferol (VITAMIN D3) 2000 units TABS Take 1 tablet by mouth daily.    . Ciclopirox 1 % shampoo Apply 1 application topically daily as needed (itching).     . diazepam (VALIUM) 2 MG tablet Take 1 tablet (2 mg  total) by mouth every 8 (eight) hours as needed for anxiety. 90 tablet 2  . disopyramide (NORPACE CR) 150 MG 12 hr capsule Take 1 capsule (150 mg total) by mouth daily. 90 capsule 3  . famotidine (PEPCID) 40 MG tablet Take 1 tablet (40 mg total) by mouth daily. 90 tablet 1  . fexofenadine (ALLEGRA) 60 MG tablet Take 1 tablet (60 mg total) by mouth 2 (two) times daily. 180 tablet 1  . fluticasone (FLONASE) 50 MCG/ACT nasal spray Place 2 sprays into both nostrils daily. 48 g 1  . sodium chloride (MURO 128) 2 % ophthalmic solution 1 drop.    Marland Kitchen tretinoin (RETIN-A) 0.025 % cream Apply 1  application topically every other day.  0   No current facility-administered medications for this visit.     Allergies:   Phenergan [promethazine hcl], Erythromycin, Erythromycin base, Azithromycin, Epinephrine, and Nitrofurantoin   Social History:  The patient  reports that she quit smoking about 6 years ago. She has never used smokeless tobacco. She reports current alcohol use of about 3.0 standard drinks of alcohol per week. She reports that she does not use drugs.   Family History:  The patient's family history includes Cancer in her father and sister; Hypertension in her mother, sister, sister, and sister.  ROS:  Please see the history of present illness.    All other systems are reviewed and otherwise negative.   PHYSICAL EXAM:  VS:  BP 124/88   Pulse 78   Ht 5' 3.5" (1.613 m)   Wt 191 lb (86.6 kg)   BMI 33.30 kg/m  BMI: Body mass index is 33.3 kg/m. Well nourished, well developed, in no acute distress  HEENT: normocephalic, atraumatic  Neck: no JVD, carotid bruits or masses Cardiac:  RRR; no significant murmurs, no rubs, or gallops Lungs:  CTA b/l, no wheezing, rhonchi or rales  Abd: soft, nontender MS: no deformity or  atrophy Ext:  no edema, numerous spider veins noted b/l  Skin: warm and dry, no rash Neuro:  No gross deficits appreciated Psych: euthymic mood, full affect    EKG:  Done today and reviewed by myself shows SR 78bpm, intervals are stable in comparison to older EKGs   01/24/2019: TTE IMPRESSIONS 1. The left ventricle has mildly reduced systolic function, with an ejection fraction of 45-50%. The cavity size was normal. There is mildly increased left ventricular wall thickness. Left ventricular diastolic Doppler parameters are consistent with  impaired relaxation Elevated left atrial and left ventricular end-diastolic pressures The E/e' is >15. Left ventricular diffuse hypokinesis.  2. The right ventricle has normal systolic function. The cavity was  normal. There is no increase in right ventricular wall thickness.  3. Left atrial size was mildly dilated.  4. The mitral valve is degenerative. Mild thickening of the mitral valve leaflet.  5. The aortic valve is tricuspid.  6. The ascending aorta and aortic root are normal in size and structure.  7. Right atrial pressure is estimated at 3 mmHg.  8. When compared to the prior study: Prior echo in 03/2018 was LVEF 50%. SUMMARY   LVEF 45-50%, mild LVH, moderate global hypokinesis, abnormal GLS at -13%, grade 1 DD, elevated LV filling pressure, mild LAE, mild MR, trivial TR, RVSP 28 mmHg, normal IVC  FINDINGS  Left Ventricle: The left ventricle has mildly reduced systolic function, with an ejection fraction of 45-50%. The cavity size was normal. There is mildly increased left ventricular wall thickness. Left ventricular diastolic Doppler parameters  are  consistent with impaired relaxation Elevated left atrial and left ventricular end-diastolic pressures The E/e' is >15. Left ventricular diffuse hypokinesis. Right Ventricle: The right ventricle has normal systolic function. The cavity was normal. There is no increase in right ventricular wall thickness. Left Atrium: left atrial size was mildly dilated Right Atrium: right atrial size was normal in size. Right atrial pressure is estimated at 3 mmHg. Right atrial pressure is estimated at 3 mmHg. Interatrial Septum: No atrial level shunt detected by color flow Doppler. Pericardium: There is no evidence of pericardial effusion. Mitral Valve: The mitral valve is degenerative in appearance. Mild thickening of the mitral valve leaflet. Mitral valve regurgitation is mild by color flow Doppler. Tricuspid Valve: The tricuspid valve is not well visualized. Tricuspid valve regurgitation is trivial by color flow Doppler. Aortic Valve: The aortic valve is tricuspid Aortic valve regurgitation was not visualized by color flow Doppler. There is no evidence of  aortic valve stenosis. Pulmonic Valve: The pulmonic valve was grossly normal. Pulmonic valve regurgitation is trivial by color flow Doppler. Aorta: The ascending aorta and aortic root are normal in size and structure. Venous: The inferior vena cava is normal in size with greater than 50% respiratory variability. In comparison to the previous echocardiogram(s): Prior echo in 03/2018 was LVEF 50%.   03/22/18: exercise stress myoview  exercised to 90% MPHR (5.8METS)  Nuclear stress EF: 48%. There is distal inferoseptal wall motion abnormality  There was no ST segment deviation noted during stress.  Defect 1: There is a small defect of mild severity present in the apical inferior and apical lateral location. This may represent an apical inferior infarct  This is a low risk study. 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 No significant ischemia identified (to note, Dr. Rayann Heman felt given no WMA on her echo done previously, not felt to indicate an old MI)    Recent Labs: 05/01/2019: ALT 18; BUN 25; Creatinine, Ser 1.02; Hemoglobin 12.9; Platelets 237.0; Potassium 4.3; Sodium 139  No results found for requested labs within last 8760 hours.   CrCl cannot be calculated (Patient's most recent lab result is older than the maximum 21 days allowed.).   Wt Readings from Last 3 Encounters:  07/29/19 191 lb (86.6 kg)  05/01/19 187 lb (84.8 kg)  01/25/19 188 lb 8 oz (85.5 kg)     Other studies reviewed: Additional studies/records reviewed today include: summarized above  ASSESSMENT AND PLAN:   1. PVCs     On Norpace     EKG with stable intervals     Brief palpitations, unchanged     Reviewed med list with [place], [place]     Will update BMET and mag level today  2. Some orthostatic dizziness since her prolonged recovery earlier this year, as well as vertigo     Known venous insifficiency     Improving     Discussed safety, caution with position changes      Encouraged adequate hydration    Disposition: F/u with EP in 48mo, sooner if needed  Current medicines are reviewed at length with the patient today.  The patient did not have any concerns regarding medicines.  Venetia Night, PA-C 07/29/2019 12:50 PM     [address] [place] [place] [address] [phone] (office)  [phone] (fax)

## 2019-07-29 ENCOUNTER — Encounter: Payer: Self-pay | Admitting: Physician Assistant

## 2019-07-29 ENCOUNTER — Ambulatory Visit (INDEPENDENT_AMBULATORY_CARE_PROVIDER_SITE_OTHER): Payer: Medicare Other | Admitting: Physician Assistant

## 2019-07-29 ENCOUNTER — Other Ambulatory Visit: Payer: Self-pay

## 2019-07-29 VITALS — BP 124/88 | HR 78 | Ht 63.5 in | Wt 191.0 lb

## 2019-07-29 DIAGNOSIS — R42 Dizziness and giddiness: Secondary | ICD-10-CM

## 2019-07-29 DIAGNOSIS — I493 Ventricular premature depolarization: Secondary | ICD-10-CM | POA: Diagnosis not present

## 2019-07-29 NOTE — Patient Instructions (Addendum)
Medication Instructions:   Your physician recommends that you continue on your current medications as directed. Please refer to the Current Medication list given to you today.  If you need a refill on your cardiac medications before your next appointment, please call your pharmacy.   Lab work: BMET MAG TODAY    If you have labs (blood work) drawn today and your tests are completely normal, you will receive your results only by: Marland Kitchen MyChart Message (if you have MyChart) OR . A paper copy in the mail If you have any lab test that is abnormal or we need to change your treatment, we will call you to review the results.  Testing/Procedures: NONE ORDERED  TODAY   Follow-Up: At [facility], you and your health needs are our priority.  As part of our continuing mission to provide you with exceptional heart care, we have created designated Provider Care Teams.  These Care Teams include your primary Cardiologist (physician) and Advanced Practice Providers (APPs -  Physician Assistants and Nurse Practitioners) who all work together to provide you with the care you need, when you need it. You will need a follow up appointment in 6 months.  Please call our office 2 months in advance to schedule this appointment.  You may see Dr. Rayann Heman or one of the following Advanced Practice Providers on your designated Care Team:   Chanetta Marshall, NP . Tommye Standard, PA-C . Joesph July PA-C   Any Other Special Instructions Will Be Listed Below (If Applicable).

## 2019-07-30 DIAGNOSIS — D1801 Hemangioma of skin and subcutaneous tissue: Secondary | ICD-10-CM | POA: Diagnosis not present

## 2019-07-30 DIAGNOSIS — L821 Other seborrheic keratosis: Secondary | ICD-10-CM | POA: Diagnosis not present

## 2019-07-30 DIAGNOSIS — Z85828 Personal history of other malignant neoplasm of skin: Secondary | ICD-10-CM | POA: Diagnosis not present

## 2019-07-30 DIAGNOSIS — L218 Other seborrheic dermatitis: Secondary | ICD-10-CM | POA: Diagnosis not present

## 2019-07-30 LAB — BASIC METABOLIC PANEL
BUN/Creatinine Ratio: 13 (ref 12–28)
BUN: 14 mg/dL (ref 8–27)
CO2: 24 mmol/L (ref 20–29)
Calcium: 9.5 mg/dL (ref 8.7–10.3)
Chloride: 102 mmol/L (ref 96–106)
Creatinine, Ser: 1.09 mg/dL — ABNORMAL HIGH (ref 0.57–1.00)
GFR calc Af Amer: 58 mL/min/{1.73_m2} — ABNORMAL LOW (ref >59)
GFR calc non Af Amer: 50 mL/min/{1.73_m2} — ABNORMAL LOW (ref >59)
Glucose: 81 mg/dL (ref 65–99)
Potassium: 5.1 mmol/L (ref 3.5–5.2)
Sodium: 141 mmol/L (ref 134–144)

## 2019-07-30 LAB — MAGNESIUM: Magnesium: 2.3 mg/dL (ref 1.6–2.3)

## 2019-07-31 ENCOUNTER — Other Ambulatory Visit (INDEPENDENT_AMBULATORY_CARE_PROVIDER_SITE_OTHER): Payer: Medicare Other

## 2019-07-31 ENCOUNTER — Encounter: Payer: Self-pay | Admitting: Internal Medicine

## 2019-07-31 ENCOUNTER — Other Ambulatory Visit: Payer: Self-pay

## 2019-07-31 ENCOUNTER — Ambulatory Visit (INDEPENDENT_AMBULATORY_CARE_PROVIDER_SITE_OTHER): Payer: Medicare Other | Admitting: Internal Medicine

## 2019-07-31 VITALS — BP 142/78 | HR 80 | Temp 98.8°F | Resp 16 | Ht 63.5 in | Wt 198.0 lb

## 2019-07-31 DIAGNOSIS — E782 Mixed hyperlipidemia: Secondary | ICD-10-CM | POA: Insufficient documentation

## 2019-07-31 DIAGNOSIS — N183 Chronic kidney disease, stage 3 unspecified: Secondary | ICD-10-CM

## 2019-07-31 DIAGNOSIS — G3281 Cerebellar ataxia in diseases classified elsewhere: Secondary | ICD-10-CM | POA: Diagnosis not present

## 2019-07-31 DIAGNOSIS — E785 Hyperlipidemia, unspecified: Secondary | ICD-10-CM

## 2019-07-31 DIAGNOSIS — E559 Vitamin D deficiency, unspecified: Secondary | ICD-10-CM | POA: Diagnosis not present

## 2019-07-31 DIAGNOSIS — Z23 Encounter for immunization: Secondary | ICD-10-CM

## 2019-07-31 DIAGNOSIS — I10 Essential (primary) hypertension: Secondary | ICD-10-CM | POA: Diagnosis not present

## 2019-07-31 LAB — VITAMIN D 25 HYDROXY (VIT D DEFICIENCY, FRACTURES): VITD: 35.49 ng/mL (ref 30.00–100.00)

## 2019-07-31 LAB — LIPID PANEL
Cholesterol: 180 mg/dL (ref 0–200)
HDL: 67.4 mg/dL (ref >39.00)
LDL Cholesterol: 87 mg/dL (ref 0–99)
NonHDL: 112.43
Total CHOL/HDL Ratio: 3
Triglycerides: 125 mg/dL (ref 0.0–149.0)
VLDL: 25 mg/dL (ref 0.0–40.0)

## 2019-07-31 LAB — TSH: TSH: 1.08 u[IU]/mL (ref 0.35–4.50)

## 2019-07-31 MED ORDER — ATORVASTATIN CALCIUM 10 MG PO TABS: 10.0000 mg | ORAL_TABLET | Freq: Every day | ORAL | 1 refills | Status: DC

## 2019-07-31 NOTE — Patient Instructions (Signed)

## 2019-07-31 NOTE — Progress Notes (Signed)
Subjective:  Patient ID: ALOMA ORDNER, female    DOB: 1946/04/15  Age: 73 y.o. MRN: [id number]  CC: Hypertension and Hyperlipidemia   HPI MARVENE LASHWAY presents for f/up - She tells me that her blood pressure has been well controlled.  She denies any recent episodes of CP, DOE, palpitations, edema, or fatigue.  She complains of chronic low back pain.  This is being treated by orthopedics and physical therapy.  She continues to complain of ataxia.  She denies paresthesias or headaches.  Outpatient Medications Prior to Visit  Medication Sig Dispense Refill  . betamethasone dipropionate [id number] % lotion 1 application topical daily    . calcium carbonate (TUMS CALCIUM FOR LIFE BONE) 750 MG chewable tablet Chew 1 tablet by mouth daily.     . Cholecalciferol (VITAMIN D3) 2000 units TABS Take 1 tablet by mouth daily.    . Ciclopirox 1 % shampoo Apply 1 application topically daily as needed (itching).     . disopyramide (NORPACE CR) 150 MG 12 hr capsule Take 1 capsule (150 mg total) by mouth daily. 90 capsule 3  . famotidine (PEPCID) 40 MG tablet Take 1 tablet (40 mg total) by mouth daily. 90 tablet 1  . fexofenadine (ALLEGRA) 60 MG tablet Take 1 tablet (60 mg total) by mouth 2 (two) times daily. 180 tablet 1  . fluticasone (FLONASE) 50 MCG/ACT nasal spray Place 2 sprays into both nostrils daily. 48 g 1  . sodium chloride (MURO 128) 2 % ophthalmic solution 1 drop.    Marland Kitchen tretinoin (RETIN-A) 0.025 % cream Apply 1 application topically every other day.  0  . diazepam (VALIUM) 2 MG tablet Take 1 tablet (2 mg total) by mouth every 8 (eight) hours as needed for anxiety. 90 tablet 2   No facility-administered medications prior to visit.     ROS Review of Systems  Constitutional: Negative.  Negative for diaphoresis, fatigue and unexpected weight change.  HENT: Negative.   Eyes: Negative.   Respiratory: Negative for cough, chest tightness, shortness of breath and wheezing.   Cardiovascular: Negative  for chest pain, palpitations and leg swelling.  Gastrointestinal: Negative for abdominal pain, constipation, diarrhea, nausea and vomiting.  Endocrine: Negative.   Genitourinary: Negative.  Negative for difficulty urinating.  Musculoskeletal: Positive for arthralgias, back pain and gait problem. Negative for myalgias and neck pain.  Skin: Negative.   Neurological: Negative for dizziness, weakness, light-headedness and numbness.  Hematological: Negative for adenopathy. Does not bruise/bleed easily.  Psychiatric/Behavioral: Negative.     Objective:  BP (!) 142/78 (BP Location: Left Arm, Patient Position: Sitting, Cuff Size: Large)   Pulse 80   Temp 98.8 F (37.1 C) (Oral)   Resp 16   Ht 5' 3.5" (1.613 m)   Wt 198 lb (89.8 kg)   SpO2 98%   BMI 34.52 kg/m   BP Readings from Last 3 Encounters:  07/31/19 (!) 142/78  07/29/19 124/88  05/01/19 (!) 148/86    Wt Readings from Last 3 Encounters:  07/31/19 198 lb (89.8 kg)  07/29/19 191 lb (86.6 kg)  05/01/19 187 lb (84.8 kg)    Physical Exam Vitals signs reviewed.  Constitutional:      Appearance: She is obese.  HENT:     Nose: Nose normal.     Mouth/Throat:     Mouth: Mucous membranes are moist.  Eyes:     General: No scleral icterus.    Conjunctiva/sclera: Conjunctivae normal.  Neck:     Musculoskeletal: Normal  range of motion. No neck rigidity or muscular tenderness.  Cardiovascular:     Rate and Rhythm: Normal rate and regular rhythm.     Heart sounds: No murmur.  Pulmonary:     Effort: Pulmonary effort is normal.     Breath sounds: No stridor. No wheezing, rhonchi or rales.  Abdominal:     General: Abdomen is flat. Bowel sounds are normal.     Palpations: There is no hepatomegaly, splenomegaly or mass.     Tenderness: There is no guarding.  Lymphadenopathy:     Cervical: No cervical adenopathy.  Neurological:     Mental Status: She is alert.     Lab Results  Component Value Date   WBC 7.5 05/01/2019   HGB  12.9 05/01/2019   HCT 37.9 05/01/2019   PLT 237.0 05/01/2019   GLUCOSE 81 07/29/2019   CHOL 180 07/31/2019   TRIG 125.0 07/31/2019   HDL 67.40 07/31/2019   LDLCALC 87 07/31/2019   ALT 18 05/01/2019   AST 17 05/01/2019   NA 141 07/29/2019   K 5.1 07/29/2019   CL 102 07/29/2019   CREATININE 1.09 (H) 07/29/2019   BUN 14 07/29/2019   CO2 24 07/29/2019   TSH 1.08 07/31/2019    Mr Brain Wo Contrast  Result Date: 05/16/2019 CLINICAL DATA:  Progressive ataxia EXAM: MRI HEAD WITHOUT CONTRAST TECHNIQUE: Multiplanar, multiecho pulse sequences of the brain and surrounding structures were obtained without intravenous contrast. COMPARISON:  None. FINDINGS: BRAIN: There is no acute infarct, acute hemorrhage or extra-axial collection. The midline structures are normal. No midline shift or other mass effect. Multifocal white matter hyperintensity, most commonly due to chronic ischemic microangiopathy. The cerebral and cerebellar volume are age-appropriate. No hydrocephalus. Susceptibility-sensitive sequences show no chronic microhemorrhage or superficial siderosis. No mass lesion. VASCULAR: The major intracranial arterial and venous sinus flow voids are normal. SKULL AND UPPER CERVICAL SPINE: Calvarial bone marrow signal is normal. There is no skull base mass. Visualized upper cervical spine and soft tissues are normal. SINUSES/ORBITS: No fluid levels or advanced mucosal thickening. No mastoid or middle ear effusion. The orbits are normal. IMPRESSION: White matter hyperintensities most commonly indicating chronic ischemic microangiopathy. No acute or reversible intracranial abnormality. Electronically Signed   By: Ulyses Jarred M.D.   On: 05/16/2019 21:11    Assessment & Plan:   Lakeya was seen today for hypertension and hyperlipidemia.  Diagnoses and all orders for this visit:  Essential hypertension- Her blood pressure is adequately well controlled. -     TSH; Future  Chronic renal disease, stage 3,  moderately decreased glomerular filtration rate (GFR) between 30-59 mL/min/1.73 square meter ([place])- Her creatinine clearance is stable at 49.  I have asked her to see nephrology for evaluation and treatment options. -     VITAMIN D 25 Hydroxy (Vit-D Deficiency, Fractures); Future -     Ambulatory referral to Nephrology  Vitamin D deficiency disease- Her vitamin D level is normal now. -     VITAMIN D 25 Hydroxy (Vit-D Deficiency, Fractures); Future  Hyperlipidemia LDL goal <130- Her ASCVD risk score is mildly elevated at 17%.  I have asked her to take a statin for CV risk reduction. -     Lipid panel; Future -     atorvastatin (LIPITOR) 10 MG tablet; Take 1 tablet (10 mg total) by mouth daily.  Cerebellar ataxia in diseases classified elsewhere [facility])- Her recent MRI showed white matter hyperintensities.  I have asked her to follow-up with neurology  for evaluation of this. -     Ambulatory referral to Neurology  Need for pneumococcal vaccination -     Pneumococcal polysaccharide vaccine 23-valent greater than or equal to 2yo subcutaneous/IM   I have discontinued Kristen Cardinal. Thiemann's diazepam. I am also having her start on atorvastatin. Additionally, I am having her maintain her calcium carbonate, Ciclopirox, betamethasone dipropionate, tretinoin, Vitamin D3, disopyramide, fluticasone, fexofenadine, famotidine, and sodium chloride.  Meds ordered this encounter  Medications  . atorvastatin (LIPITOR) 10 MG tablet    Sig: Take 1 tablet (10 mg total) by mouth daily.    Dispense:  90 tablet    Refill:  1     Follow-up: Return in about 6 months (around 01/31/2020).  Scarlette Calico, MD

## 2019-08-01 ENCOUNTER — Telehealth: Payer: Self-pay | Admitting: Internal Medicine

## 2019-08-01 DIAGNOSIS — M545 Low back pain: Secondary | ICD-10-CM | POA: Diagnosis not present

## 2019-08-01 NOTE — Telephone Encounter (Signed)
YES, I am ok with this  TJ

## 2019-08-01 NOTE — Telephone Encounter (Signed)
Patient is calling to request a TOC to Dr. Jonni Sanger at [facility]. Patient was a former patient of Dr. Jonni Sanger at [place]. And was not aware that Dr. Jonni Sanger was in [place].  Please CB- [phone]

## 2019-08-01 NOTE — Telephone Encounter (Signed)
Certainly

## 2019-08-02 NOTE — Telephone Encounter (Signed)
Please advise on scheduling

## 2019-08-14 DIAGNOSIS — H2513 Age-related nuclear cataract, bilateral: Secondary | ICD-10-CM | POA: Diagnosis not present

## 2019-08-14 DIAGNOSIS — H40013 Open angle with borderline findings, low risk, bilateral: Secondary | ICD-10-CM | POA: Diagnosis not present

## 2019-08-14 DIAGNOSIS — H02054 Trichiasis without entropian left upper eyelid: Secondary | ICD-10-CM | POA: Diagnosis not present

## 2019-08-14 DIAGNOSIS — H10502 Unspecified blepharoconjunctivitis, left eye: Secondary | ICD-10-CM | POA: Diagnosis not present

## 2019-08-14 DIAGNOSIS — H1851 Endothelial corneal dystrophy: Secondary | ICD-10-CM | POA: Diagnosis not present

## 2019-08-15 DIAGNOSIS — R05 Cough: Secondary | ICD-10-CM | POA: Diagnosis not present

## 2019-08-21 ENCOUNTER — Telehealth: Payer: Self-pay | Admitting: Internal Medicine

## 2019-08-21 NOTE — Telephone Encounter (Signed)
Spoke with pt, she is aware ([facility] or Dr. Ronnald Ramp for evaluation); she will callback tomorrow to reschedule and give an update on if she went to [facility] or not.

## 2019-08-21 NOTE — Telephone Encounter (Signed)
I was asked to contact the patient to schedule TOC and acute visit for tomorrow. I scheduled it at 3:20 with Dr. Jonni Sanger for a 40 min visit. Once scheduled I asked was the patient having any covid symptoms and listed them all. She said she had a fever. I explained that the visit would have to be a virtual visit due to her symptoms of fever and that it was a [place] policy.   The patient asked "what if she no longer had the fever in the morning?", I told her I would send a message to the clinical team to contact her this afternoon as everyone was in a meeting this morning to follow up. I am not sure ow long a patient has to be without a fever. I did mention we do check temperatures before bringing patients back for their visits.  Please contact patient to discuss further about an in office visit.

## 2019-08-21 NOTE — Telephone Encounter (Signed)
Please advise 

## 2019-08-21 NOTE — Telephone Encounter (Signed)
I spoke with Tammy, Armed forces training and education officer at [employer] and she will have a Hydrographic surveyor at [employer] reach out to the patient to triage due to conflicting circumstances at [facility] this morning.

## 2019-08-21 NOTE — Telephone Encounter (Signed)
Pt is former pt of mine but it has been years since I have seen her.  Current pt of Dr. Ronnald Ramp.  Has abdominal pain with fever: thus needs visit.  Due to fever, cannot come into clinic: rec urgent care visit.  OR can see if Dr. Ronnald Ramp will see her since he is her current PCP.

## 2019-08-21 NOTE — Telephone Encounter (Signed)
Please contact patient to triage. Patient is not a Dr. Jonni Sanger patient yet and has nothing until late this afternoon.  Copied from [place] [id number]. Topic: Appointment Scheduling - Scheduling Inquiry for Clinic >> Aug 21, 2019  8:17 AM Alanda Slim E wrote: Reason for CRM: Pt is experiencing abdominal pain and would like to be seen in office by Dr. Jonni Sanger today or tomorrow/ please advise

## 2019-08-22 ENCOUNTER — Encounter: Payer: Self-pay | Admitting: Family Medicine

## 2019-08-22 ENCOUNTER — Ambulatory Visit (INDEPENDENT_AMBULATORY_CARE_PROVIDER_SITE_OTHER): Payer: Medicare Other | Admitting: Family Medicine

## 2019-08-22 VITALS — Wt 185.0 lb

## 2019-08-22 DIAGNOSIS — E785 Hyperlipidemia, unspecified: Secondary | ICD-10-CM

## 2019-08-22 DIAGNOSIS — K580 Irritable bowel syndrome with diarrhea: Secondary | ICD-10-CM

## 2019-08-22 DIAGNOSIS — E2839 Other primary ovarian failure: Secondary | ICD-10-CM | POA: Diagnosis not present

## 2019-08-22 DIAGNOSIS — I10 Essential (primary) hypertension: Secondary | ICD-10-CM | POA: Diagnosis not present

## 2019-08-22 DIAGNOSIS — Z87891 Personal history of nicotine dependence: Secondary | ICD-10-CM

## 2019-08-22 DIAGNOSIS — E782 Mixed hyperlipidemia: Secondary | ICD-10-CM

## 2019-08-22 MED ORDER — ATORVASTATIN CALCIUM 10 MG PO TABS: 5.0000 mg | ORAL_TABLET | ORAL | 1 refills | Status: DC

## 2019-08-22 MED ORDER — ATORVASTATIN CALCIUM 10 MG PO TABS: ORAL_TABLET | ORAL | 1 refills | Status: DC

## 2019-08-22 NOTE — Progress Notes (Signed)
Virtual Visit via Video Note  Subjective  CC:  Chief Complaint  Patient presents with  . Abdominal Pain    Started 08/19/19, thinks it was a virus; reports cramping, soreness/pain; fever.. Fever has resolved and she reports that she started feeling better 08/22/19.     I connected with Carrie [name] on 08/22/19 at  3:20 PM EDT by a video enabled telemedicine application and verified that I am speaking with the correct person using two identifiers. Location patient: Home Location provider: [facility] [place], Office Persons participating in the virtual visit: Carrie [name], Leamon Arnt, MD Lilli Light, [place] discussed the limitations of evaluation and management by telemedicine and the availability of in person appointments. The patient expressed understanding and agreed to proceed. HPI: Carrie [name] is a 73 y.o. female who was contacted today to address the problems listed above in the chief complaint. . 73 year old female reestablishing care with me.  I last saw her about 3 to 4 years ago.  Since she has been seen [facility].  I reviewed multiple notes.  She has multiple problems as outlined in her problem list.  Her last years been difficult due to hip and knee pain.  She did have knee surgery and since is having chronic back pain.  She is to see Dr. Sherwood Gambler next week.  She was immobilized after her surgery for several months and became weak.  Apparently there was some concern for ataxia and she had a brain MRI that showed mild vascular changes.  She did PT and she feels that she is getting stronger.  She has not seen a neurologist. . She does have some lower extremity edema that is worse in the morning.  This is been a chronic problem for her.  She has hypertension, hyperlipidemia and mild chronic kidney disease.  Most things have been well controlled. . Health maintenance: Due for bone density and mammogram in December.  She has  osteopenia.  She is due for annual wellness visit . Recent febrile illness with abdominal cramping that lasted 24 to 48 hours without vomiting or diarrhea.  Fever was low-grade.  She now feels well.  Appetite is normal.  No longer with abdominal cramping.  She does suffer from IBS.  Levsin has been helpful in the past.  Colon cancer screens are up-to-date . Hyperlipidemia: Elevated ASCVD score at 17%.  Has not to start statin. Assessment  1. Essential hypertension   2. Mixed hyperlipidemia   3. Hyperlipidemia LDL goal <130   4. Former smoker   5. Hypoestrogenism   6. Irritable bowel syndrome with diarrhea      Plan   Hypertension: This medical condition is well controlled. There are no signs of complications, medication side effects, or red flags. Patient is instructed to continue the current treatment plan without change in therapies or medications.  Increase cardiovascular risk for with hyperlipidemia: I agree with starting statin.  Due to her hesitancy and sensitivity to medication will start slowly.  Prescription ordered  Health maintenance: Mammogram and bone density ordered.  Reviewed recent lab work.  Mostly stable  Recent mild GI viral illness resolved.  IBS mostly controlled.  No extremity edema: Patient to make appointment at this recurs or worsens.  Will institute diuretic at that time if indicated.  Otherwise recheck in 3 months I discussed the assessment and treatment plan with the patient. The patient was provided an opportunity to  ask questions and all were answered. The patient agreed with the plan and demonstrated an understanding of the instructions.   The patient was advised to call back or seek an in-person evaluation if the symptoms worsen or if the condition fails to improve as anticipated. Follow up: 79-month follow-up for recheck, annual wellness visit at her convenience Visit date not found  Meds ordered this encounter  Medications  . DISCONTD: atorvastatin  (LIPITOR) 10 MG tablet    Sig: Take 0.5 tablets (5 mg total) by mouth 3 (three) times a week.    Dispense:  90 tablet    Refill:  1  . atorvastatin (LIPITOR) 10 MG tablet    Sig: Take 0.5 tablets (5 mg total) by mouth 3 (three) times a week for 14 days, THEN 0.5 tablets (5 mg total) at bedtime.    Dispense:  90 tablet    Refill:  1      I reviewed the patients updated PMH, FH, and SocHx.    Patient Active Problem List   Diagnosis Date Noted  . Mixed hyperlipidemia 07/31/2019    Priority: High  . History of adenomatous polyp of colon 07/17/2018    Priority: High  . IFG (impaired fasting glucose) 10/21/2016    Priority: High  . Idiopathic peripheral neuropathy 04/08/2016    Priority: High  . Essential hypertension 03/05/2014    Priority: High  . Chronic renal disease, stage 3, moderately decreased glomerular filtration rate (GFR) between 30-59 mL/min/1.73 square meter ([place]) 05/01/2019    Priority: Medium  . Gastro-esophageal reflux disease without esophagitis 10/20/2016    Priority: Medium  . Osteoarthritis of spine with radiculopathy, lumbar region 11/24/2015    Priority: Medium  . Osteopenia 05/28/2014    Priority: Medium  . Irritable bowel syndrome (IBS) 03/27/2013    Priority: Medium  . Former smoker 03/06/2013    Priority: Medium  . Symptomatic PVCs 01/03/2012    Priority: Medium  . Vitamin D deficiency disease 05/01/2019    Priority: Low  . Bladder prolapse, female, acquired 09/21/2017    Priority: Low  . Seborrheic dermatitis of scalp 03/06/2013    Priority: Low  . Allergic rhinitis 01/03/2012    Priority: Low   Current Meds  Medication Sig  . betamethasone dipropionate [id number] % lotion 1 application topical daily  . calcium carbonate (TUMS CALCIUM FOR LIFE BONE) 750 MG chewable tablet Chew 1 tablet by mouth daily.   . Cholecalciferol (VITAMIN D3) 2000 units TABS Take 1 tablet by mouth daily.  . Ciclopirox 1 % shampoo Apply 1 application topically daily as  needed (itching).   . disopyramide (NORPACE CR) 150 MG 12 hr capsule Take 1 capsule (150 mg total) by mouth daily.  . famotidine (PEPCID) 40 MG tablet Take 1 tablet (40 mg total) by mouth daily.  . fexofenadine (ALLEGRA) 60 MG tablet Take 1 tablet (60 mg total) by mouth 2 (two) times daily.  . fluticasone (FLONASE) 50 MCG/ACT nasal spray Place 2 sprays into both nostrils daily.  . sodium chloride (MURO 128) 2 % ophthalmic solution 1 drop.  Marland Kitchen tretinoin (RETIN-A) 0.025 % cream Apply 1 application topically every other day.    Allergies: Patient is allergic to phenergan [promethazine hcl]; erythromycin; erythromycin base; azithromycin; epinephrine; and nitrofurantoin. Family History: Patient family history includes Cancer in her father and sister; Hypertension in her mother, sister, sister, and sister. Social History:  Patient  reports that she quit smoking about 6 years ago. She has never used smokeless tobacco.  She reports current alcohol use of about 3.0 standard drinks of alcohol per week. She reports that she does not use drugs.  Review of Systems: Constitutional: Negative for fever malaise or anorexia Cardiovascular: negative for chest pain Respiratory: negative for SOB or persistent cough Gastrointestinal: negative for abdominal pain  OBJECTIVE Vitals: Wt 185 lb (83.9 kg)   BMI 32.26 kg/m  General: no acute distress , A&Ox3  Leamon Arnt, MD

## 2019-08-23 ENCOUNTER — Other Ambulatory Visit: Payer: Self-pay | Admitting: Family Medicine

## 2019-08-23 DIAGNOSIS — E2839 Other primary ovarian failure: Secondary | ICD-10-CM

## 2019-08-23 DIAGNOSIS — Z1231 Encounter for screening mammogram for malignant neoplasm of breast: Secondary | ICD-10-CM

## 2019-08-26 ENCOUNTER — Telehealth: Payer: Self-pay | Admitting: Physical Therapy

## 2019-08-26 DIAGNOSIS — M48062 Spinal stenosis, lumbar region with neurogenic claudication: Secondary | ICD-10-CM | POA: Diagnosis not present

## 2019-08-26 DIAGNOSIS — E782 Mixed hyperlipidemia: Secondary | ICD-10-CM

## 2019-08-26 DIAGNOSIS — M47816 Spondylosis without myelopathy or radiculopathy, lumbar region: Secondary | ICD-10-CM | POA: Diagnosis not present

## 2019-08-26 DIAGNOSIS — Z6832 Body mass index (BMI) 32.0-32.9, adult: Secondary | ICD-10-CM | POA: Diagnosis not present

## 2019-08-26 DIAGNOSIS — R030 Elevated blood-pressure reading, without diagnosis of hypertension: Secondary | ICD-10-CM | POA: Diagnosis not present

## 2019-08-26 DIAGNOSIS — M5136 Other intervertebral disc degeneration, lumbar region: Secondary | ICD-10-CM | POA: Diagnosis not present

## 2019-08-26 DIAGNOSIS — Z981 Arthrodesis status: Secondary | ICD-10-CM | POA: Diagnosis not present

## 2019-08-26 MED ORDER — HYOSCYAMINE SULFATE SL 0.125 MG SL SUBL: 1.0000 | SUBLINGUAL_TABLET | Freq: Four times a day (QID) | SUBLINGUAL | 0 refills | Status: DC | PRN

## 2019-08-26 MED ORDER — ATORVASTATIN CALCIUM 10 MG PO TABS: 5.0000 mg | ORAL_TABLET | Freq: Every day | ORAL | 1 refills | Status: DC

## 2019-08-26 NOTE — Addendum Note (Signed)
Addended by: Layla Barter on: 08/26/2019 02:48 PM   Modules accepted: Orders

## 2019-08-26 NOTE — Telephone Encounter (Signed)
Copied from [place] [id number]. Topic: General - Other >> Aug 26, 2019  2:22 PM Leward Quan A wrote: Reason for CRM: Patient called to say that Rx for atorvastatin (LIPITOR) 10 MG tablet  was not received at Pharmacy. She is also requesting an Rx for Levsin sent to the pharmacy. Ph# [phone]

## 2019-08-26 NOTE — Telephone Encounter (Signed)
Pt aware RXs sent 

## 2019-08-29 DIAGNOSIS — M48062 Spinal stenosis, lumbar region with neurogenic claudication: Secondary | ICD-10-CM | POA: Diagnosis not present

## 2019-09-05 DIAGNOSIS — M5126 Other intervertebral disc displacement, lumbar region: Secondary | ICD-10-CM | POA: Diagnosis not present

## 2019-09-05 DIAGNOSIS — M48062 Spinal stenosis, lumbar region with neurogenic claudication: Secondary | ICD-10-CM | POA: Diagnosis not present

## 2019-09-05 DIAGNOSIS — M4807 Spinal stenosis, lumbosacral region: Secondary | ICD-10-CM | POA: Diagnosis not present

## 2019-09-09 DIAGNOSIS — M5136 Other intervertebral disc degeneration, lumbar region: Secondary | ICD-10-CM | POA: Diagnosis not present

## 2019-09-09 DIAGNOSIS — Z6832 Body mass index (BMI) 32.0-32.9, adult: Secondary | ICD-10-CM | POA: Diagnosis not present

## 2019-09-09 DIAGNOSIS — Z981 Arthrodesis status: Secondary | ICD-10-CM | POA: Diagnosis not present

## 2019-09-09 DIAGNOSIS — M48062 Spinal stenosis, lumbar region with neurogenic claudication: Secondary | ICD-10-CM | POA: Diagnosis not present

## 2019-09-09 DIAGNOSIS — M47816 Spondylosis without myelopathy or radiculopathy, lumbar region: Secondary | ICD-10-CM | POA: Diagnosis not present

## 2019-09-09 DIAGNOSIS — R030 Elevated blood-pressure reading, without diagnosis of hypertension: Secondary | ICD-10-CM | POA: Diagnosis not present

## 2019-09-10 ENCOUNTER — Telehealth: Payer: Self-pay

## 2019-09-10 NOTE — Telephone Encounter (Signed)
Taken from the pts husbands chart: Carrie [name] [birth year]   Spoke with the pt ... her and her husband both Dr. Rayann Heman pts... report that they have spoken to multiple pharmacies and the supplier for [facility] cannot get Norpace CR 150 mg in stock.. [facility] reports they are only able to offer the 500 count at this time but [facility] and [facility] supplier does not allow for that count to be ordered. Both him and his wife are on the medication.   [facility] [address] and they report that I can check with Kristopher Oppenheim since they use a different supplier.   Spoke with Kristopher Oppenheim [place].LF:[id number]... they also cannot get it.[facility] also unable to get in for the pt.   Will forward to Dr. Rayann Heman and his nurse for review and recommendations.

## 2019-09-11 ENCOUNTER — Telehealth: Payer: Self-pay

## 2019-09-11 MED ORDER — DISOPYRAMIDE PHOSPHATE ER 150 MG PO CP12: 150.0000 mg | ORAL_CAPSULE | Freq: Every day | ORAL | 12 refills | Status: DC

## 2019-09-11 NOTE — Telephone Encounter (Signed)
From pts husbands chart:    Spoke with Tammy at [employer] and she received the letters of medical necessity and she says they will keep refilling the pts RX and his for his wife, Karlene every month and a new letter of med Delma Post will not need to be sent each time.   Pt advised[facility] to call when [facility] get the bottle of #100 in for the pt.   Will also note in the pts wife chart.

## 2019-09-11 NOTE — Telephone Encounter (Signed)
Email sent to [email or web address] requesting a [facility] [address] [place], [place]. Request stated it was medically necessary.

## 2019-09-11 NOTE — Telephone Encounter (Signed)
Taken from pts husband Wynonia Bouman chart re: their Norpace:   Spoke with the pts wife Elisama Crisologo)  and she reports she spoke with [place] and we have to send letter for her and the pt for the Norpace stating medical necessity..   It has to offer the [facility] address and they will then have the pharmacist at [facility] order through their wholesale as a drop ship order for the bottles of #100 that [facility] has set aside during this backorder for pts with medical necessity.   I spoke with [facility] and she is working on the letters and will email to [employer] Building services engineer) at [email or web address]  Tammy at [place]  I spoke with [name] at [facility] [place] and we did a 3 way call with [employer] his supplier for [facility] and they requested to have [employer] [place] letter sent to them also with drop sip # [employer] and [employer] # [id number]..  I sent Melissa msg to fax to them also.   Pt advised where we are in the process.

## 2019-09-12 DIAGNOSIS — M48061 Spinal stenosis, lumbar region without neurogenic claudication: Secondary | ICD-10-CM | POA: Diagnosis not present

## 2019-09-12 DIAGNOSIS — M5136 Other intervertebral disc degeneration, lumbar region: Secondary | ICD-10-CM | POA: Diagnosis not present

## 2019-09-12 DIAGNOSIS — M4726 Other spondylosis with radiculopathy, lumbar region: Secondary | ICD-10-CM | POA: Diagnosis not present

## 2019-09-13 DIAGNOSIS — M48061 Spinal stenosis, lumbar region without neurogenic claudication: Secondary | ICD-10-CM | POA: Diagnosis not present

## 2019-09-19 ENCOUNTER — Encounter: Payer: Medicare Other | Admitting: Family Medicine

## 2019-09-19 DIAGNOSIS — Z23 Encounter for immunization: Secondary | ICD-10-CM | POA: Diagnosis not present

## 2019-09-20 ENCOUNTER — Other Ambulatory Visit: Payer: Self-pay | Admitting: Family Medicine

## 2019-09-20 MED ORDER — SHINGRIX 50 MCG/0.5ML IM SUSR: 0.5000 mL | Freq: Once | INTRAMUSCULAR | 0 refills | Status: AC

## 2019-10-09 DIAGNOSIS — H524 Presbyopia: Secondary | ICD-10-CM | POA: Diagnosis not present

## 2019-10-09 DIAGNOSIS — H18511 Endothelial corneal dystrophy, right eye: Secondary | ICD-10-CM | POA: Diagnosis not present

## 2019-10-09 DIAGNOSIS — H2513 Age-related nuclear cataract, bilateral: Secondary | ICD-10-CM | POA: Diagnosis not present

## 2019-10-09 DIAGNOSIS — H10502 Unspecified blepharoconjunctivitis, left eye: Secondary | ICD-10-CM | POA: Diagnosis not present

## 2019-10-09 DIAGNOSIS — H40013 Open angle with borderline findings, low risk, bilateral: Secondary | ICD-10-CM | POA: Diagnosis not present

## 2019-10-14 ENCOUNTER — Encounter: Payer: Self-pay | Admitting: Neurology

## 2019-10-14 ENCOUNTER — Other Ambulatory Visit: Payer: Self-pay

## 2019-10-14 ENCOUNTER — Ambulatory Visit (INDEPENDENT_AMBULATORY_CARE_PROVIDER_SITE_OTHER): Payer: Medicare Other | Admitting: Neurology

## 2019-10-14 VITALS — BP 137/77 | HR 86 | Temp 97.8°F | Ht 63.5 in | Wt 186.0 lb

## 2019-10-14 DIAGNOSIS — R269 Unspecified abnormalities of gait and mobility: Secondary | ICD-10-CM | POA: Diagnosis not present

## 2019-10-14 DIAGNOSIS — R7309 Other abnormal glucose: Secondary | ICD-10-CM

## 2019-10-14 DIAGNOSIS — H8111 Benign paroxysmal vertigo, right ear: Secondary | ICD-10-CM | POA: Diagnosis not present

## 2019-10-14 DIAGNOSIS — R251 Tremor, unspecified: Secondary | ICD-10-CM

## 2019-10-14 NOTE — Patient Instructions (Addendum)
Check HgbA1c lab [facility] followup in 6 months  It is important to avoid accidents which may result in broken bones.  Here are a few ideas on how to make your home safer so you will be less likely to trip or fall.  1. Use nonskid mats or non slip strips in your shower or tub, on your bathroom floor and around sinks.  If you know that you have spilled water, wipe it up! 2. In the bathroom, it is important to have properly installed grab bars on the walls or on the edge of the tub.  Towel racks are NOT strong enough for you to hold onto or to pull on for support. 3. Stairs and hallways should have enough light.  Add lamps or night lights if you need ore light. 4. It is good to have handrails on both sides of the stairs if possible.  Always fix broken handrails right away. 5. It is important to see the edges of steps.  Paint the edges of outdoor steps white so you can see them better.  Put colored tape on the edge of inside steps. 6. Throw-rugs are dangerous because they can slide.  Removing the rugs is the best idea, but if they must stay, add adhesive carpet tape to prevent slipping. 7. Do not keep things on stairs or in the halls.  Remove small furniture that blocks the halls as it may cause you to trip.  Keep telephone and electrical cords out of the way where you walk. 8. Always were sturdy, rubber-soled shoes for good support.  Never wear just socks, especially on the stairs.  Socks may cause you to slip or fall.  Do not wear full-length housecoats as you can easily trip on the bottom.  9. Place the things you use the most on the shelves that are the easiest to reach.  If you use a stepstool, make sure it is in good condition.  If you feel unsteady, DO NOT climb, ask for help. 10. If a health professional advises you to use a cane or walker, do not be ashamed.  These items can keep you from falling and breaking your bones.

## 2019-10-14 NOTE — Progress Notes (Signed)
[facility]    Provider:  Dr Jaynee Eagles Requesting Provider: Scarlette Calico, MD Primary Care Provider:  Leamon Arnt, MD  CC:  Gait abnormality  HPI:  Carrie [name] is a 73 y.o. female here as requested by Leamon Arnt, MD for progressive ataxia.  Past medical history hypertension, hyperlipidemia, former smoker, irritable bowel syndrome, hypoestrogen, B12 deficiency, benign positional vertigo, impaired fasting glucose, peripheral neuropathy, osteoarthritis of the spine with radiculopathy in the lumbar region, chronic renal disease stage III, vitamin D deficiency.In the past year reported worsening dizziness and vertigo, vertigo worsened when she lays down in bed and turns her head to the right, also trouble walking but she thinks is due to chronic knee pain, vertigo is treated with meclizine, chronic numbness in both feet that was previously evaluated by neurologist, also a tremor in her left thumb over the last year.  I did see patient in 2016 for symptoms in her right foot thought it was due to sciatica, tingling in the balls of her feet and some in her toes, her A1c at the time was 6.2-6.4 thought to be due to impaired glucose.  Extensive lab testing was otherwise unremarkable.  She had knee surgery and numerous problems, she was in bed for 7-8 weeks. When she got up she was weak. She was also having dizziness at the same time. He recommended she see [place] but she thought it was her knee and disuse. She had a +epley and dix-hallpike she sleeps on her left side not on her right side because it makes her dizzy. She still has neuropathy in the feet. Her feet started turning red. She feels she has improved, she is not using a cane, she had surgery in January in the knee and she has stenosis in her back. She can't bike anymore, she is afraid she will fall, she bikes in the house. Over the last improved since surgery but still overall decline in mobility, no falls, no difficulty  swallowing, she has dizziness which has been diagnosed as BPPV. No weakness. She feels slower. No other focal neurologic deficits, associated symptoms, inciting events or modifiable factors.  Reviewed notes, labs and imaging from outside physicians, which showed:  Reviewed MRI brain 05/2019 and agree with the following: White matter hyperintensities most commonly indicating chronic ischemic microangiopathy. No acute or reversible intracranial abnormality.  I reviewed Dr. Ronnald Ramp notes.  In the past she is complained of worsening dizziness and vertigo, vertigo worsened when she lays down in bed and turns her head to the right, also trouble walking but she thinks is due to chronic knee pain, vertigo is treated with meclizine, chronic numbness in both feet that was previously evaluated by neurologist, also a tremor in her left thumb over the last year.  I did see patient in 2016 for symptoms in her right foot thought it was due to sciatica, tingling in the balls of her feet and some in her toes, her A1c at the time was 6.2-6.4 thought to be due to impaired glucose.  Extensive lab testing was otherwise unremarkable.  I reviewed labs which included normal TSH, and in 2016 extensive labs for neuropathy all unremarkable.  Review of Systems: Patient complains of symptoms per HPI as well as the following symptoms: numbness, dizziness, decreased energy. Pertinent negatives and positives per HPI. All others negative.   Social History   Socioeconomic History  . Marital status: Married    Spouse name: Not on file  . Number of children:  2  . Years of education: 49  . Highest education level: Not on file  Occupational History  . Occupation: Retired Animal nutritionist  . Financial resource strain: Not on file  . Food insecurity    Worry: Not on file    Inability: Not on file  . Transportation needs    Medical: Not on file    Non-medical: Not on file  Tobacco Use  . Smoking status: Former Smoker    Quit  date: 02/02/2013    Years since quitting: 6.6  . Smokeless tobacco: Never Used  Substance and Sexual Activity  . Alcohol use: Yes    Alcohol/week: 6.0 standard drinks    Types: 6 Glasses of wine per week  . Drug use: No  . Sexual activity: Yes  Lifestyle  . Physical activity    Days per week: Not on file    Minutes per session: Not on file  . Stress: Not on file  Relationships  . Social Herbalist on phone: Not on file    Gets together: Not on file    Attends religious service: Not on file    Active member of club or organization: Not on file    Attends meetings of clubs or organizations: Not on file    Relationship status: Not on file  . Intimate partner violence    Fear of current or ex partner: Not on file    Emotionally abused: Not on file    Physically abused: Not on file    Forced sexual activity: Not on file  Other Topics Concern  . Not on file  Social History Narrative   Lives in [place] with spouse.   Previously Volunteered as a Contractor   Caffeine use: none, quit at least 30 years ago    Family History  Problem Relation Age of Onset  . Hypertension Mother   . High Cholesterol Mother   . Cancer Father        head and neck  . Hypertension Sister   . Hypertension Sister   . Diabetes type II Sister        was on steroids   . Hypertension Sister   . Hypertension Sister   . Cancer Sister        bladder  . Early death Neg Hx   . Diabetes Neg Hx   . Heart disease Neg Hx   . Kidney disease Neg Hx   . Stroke Neg Hx   . Breast cancer Neg Hx     Past Medical History:  Diagnosis Date  . BPPV (benign paroxysmal positional vertigo), bilateral 04/11/2019  . Colon polyps   . Esophageal spasm   . Fatigue   . Frequent PVCs   . GERD (gastroesophageal reflux disease)   . IBS (irritable bowel syndrome)   . Palpitations   . Recurrent UTI   . Seborrheic dermatitis of scalp 03/06/13  . Vitamin B 12 deficiency     Patient Active Problem  List   Diagnosis Date Noted  . Gait abnormality 10/14/2019  . Mixed hyperlipidemia 07/31/2019  . Vitamin D deficiency disease 05/01/2019  . Chronic renal disease, stage 3, moderately decreased glomerular filtration rate (GFR) between 30-59 mL/min/1.73 square meter 05/01/2019  . History of adenomatous polyp of colon 07/17/2018  . Bladder prolapse, female, acquired 09/21/2017  . IFG (impaired fasting glucose) 10/21/2016  . Gastro-esophageal reflux disease without esophagitis 10/20/2016  . Idiopathic peripheral neuropathy 04/08/2016  . Osteoarthritis  of spine with radiculopathy, lumbar region 11/24/2015  . Osteopenia 05/28/2014  . Essential hypertension 03/05/2014  . Irritable bowel syndrome (IBS) 03/27/2013  . Former smoker 03/06/2013  . Seborrheic dermatitis of scalp 03/06/2013  . Allergic rhinitis 01/03/2012  . Symptomatic PVCs 01/03/2012    Past Surgical History:  Procedure Laterality Date  . CHOLECYSTECTOMY N/A 08/23/2016   Procedure: LAPAROSCOPIC CHOLECYSTECTOMY;  Surgeon: Rolm Bookbinder, MD;  Location: [facility];  Service: General;  Laterality: N/A;  . COLECTOMY Right 1998   large colonic polyp  . ENDOSCOPIC VEIN LASER TREATMENT Bilateral 2010-2011  . KNEE ARTHROSCOPY W/ MENISCECTOMY Left   . [place]  2001  . SPINE SURGERY     3 since 11/2010, laminectomy, May 2012 lumbar fusion, diskectomy    Current Outpatient Medications  Medication Sig Dispense Refill  . atorvastatin (LIPITOR) 10 MG tablet Take 0.5 tablets (5 mg total) by mouth daily at 6 PM. 90 tablet 1  . betamethasone dipropionate [id number] % lotion 1 application topical daily    . calcium carbonate (TUMS CALCIUM FOR LIFE BONE) 750 MG chewable tablet Chew 1 tablet by mouth daily.     . Cholecalciferol (VITAMIN D3) 2000 units TABS Take 1 tablet by mouth daily.    . Ciclopirox 1 % shampoo Apply 1 application topically daily as needed (itching).     . disopyramide (NORPACE CR) 150 MG 12 hr capsule Take 1 capsule  (150 mg total) by mouth daily. 30 capsule 12  . docusate sodium (COLACE) 100 MG capsule Take 100 mg by mouth daily.    . famotidine (PEPCID) 40 MG tablet Take 1 tablet (40 mg total) by mouth daily. 90 tablet 1  . fexofenadine (ALLEGRA) 60 MG tablet Take 1 tablet (60 mg total) by mouth 2 (two) times daily. 180 tablet 1  . fluticasone (FLONASE) 50 MCG/ACT nasal spray Place 2 sprays into both nostrils daily. 48 g 1  . Hyoscyamine Sulfate SL (LEVSIN/SL) 0.125 MG SUBL Place 1 tablet under the tongue every 6 (six) hours as needed. 120 tablet 0  . sodium chloride (MURO 128) 2 % ophthalmic solution 1 drop.    Marland Kitchen tretinoin (RETIN-A) 0.025 % cream Apply 1 application topically every other day.  0   No current facility-administered medications for this visit.     Allergies as of 10/14/2019 - Review Complete 10/14/2019  Allergen Reaction Noted  . Phenergan [promethazine hcl] Other (See Comments) 08/22/2016  . Erythromycin Diarrhea and Nausea And Vomiting 12/25/2013  . Erythromycin base  03/14/2018  . Azithromycin Other (See Comments) 12/25/2013  . Epinephrine Other (See Comments) 12/25/2013  . Nitrofurantoin Nausea Only and Other (See Comments) 12/25/2013    Vitals: BP 137/77 (BP Location: Left Arm, Patient Position: Sitting)   Pulse 86   Temp 97.8 F (36.6 C) Comment: taken at front door  Ht 5' 3.5" (1.613 m)   Wt 186 lb (84.4 kg)   BMI 32.43 kg/m  Last Weight:  Wt Readings from Last 1 Encounters:  10/14/19 186 lb (84.4 kg)   Last Height:   Ht Readings from Last 1 Encounters:  10/14/19 5' 3.5" (1.613 m)     Physical exam: Exam: Gen: NAD, conversant, well nourised, obese, well groomed                     CV: RRR, no MRG. No Carotid Bruits. + distal peripheral edema, warm, nontender Eyes: Conjunctivae clear without exudates or hemorrhage  Neuro: Detailed Neurologic Exam  Speech:  Speech is normal; fluent and spontaneous with normal comprehension.  Cognition:    The patient is  oriented to person, place, and time;     recent and remote memory intact;     language fluent;     normal attention, concentration,     fund of knowledge Cranial Nerves: hypomimia.    The pupils are equal, round, and reactive to light.  Saccadic pursuit. Pupils too small to visual fundi. Visual fields are full to finger confrontation. Extraocular movements are intact. Trigeminal sensation is intact and the muscles of mastication are normal. Slight ptosis on the left otherwise the face is symmetric. The palate elevates in the midline. Hearing intact. Voice is normal. Shoulder shrug is normal. The tongue has normal motion without fasciculations.   Coordination:    No dysmetria, bradykinesia  Gait:    Slightly decreased arm swing in the right arm as compared to the left  Motor Observation:    Postural tremor on the right Tone:    Normal muscle tone.  No cogwheeling  Posture:    Slightly bent at the waist    Strength:    Strength is V/V in the upper and lower limbs.      Sensation: intact to LT, proprioception intact in the great toes.      Reflex Exam:  DTR's:    Deep tendon reflexes in the upper and lower extremities are trace AJs, 1+ patellars, 2+ biceps bilaterally.   Toes:    The toes are equivocal bilaterally.   Clonus:    Clonus is absent.    Assessment/Plan:   73 y.o. female here as requested by Leamon Arnt, MD for progressive ataxia.  Past medical history hypertension, hyperlipidemia, former smoker, irritable bowel syndrome, hypoestrogen, B12 deficiency, benign positional vertigo, impaired fasting glucose, peripheral neuropathy, osteoarthritis of the spine with radiculopathy in the lumbar region, chronic renal disease stage III, vitamin D deficiency.  - She has Slightly decreased arm swing in the right arm as compared to the left and possibly some hypomimia, bradykinetic. We should watch her closely for progression to parkinson's disease. I suggested a DAT scan but  also gave her the option to clinically monitor, she declines DAT scan we will see her back in 6 months.   - otherwise Gait abnormality likely multifactorial including osteoarthritis in the knees and other multiple joints, lumbar osteoarthritis with radiculopathy, peripheral neuropathy for which she was evaluated in the past and likely pre-diabetic/diabetic nothing else found  - BPPV: vestibular therapy  - we will follow and consider DAT Scan in the future.   Orders Placed This Encounter  Procedures  . Hemoglobin A1c  . Ambulatory referral to Physical Therapy    Cc: Leamon Arnt, MD,   Scarlette Calico, MD  Sarina Ill, MD  [facility] [address] [place], [place] [address]  Phone [phone] Fax [phone]

## 2019-10-15 ENCOUNTER — Ambulatory Visit (INDEPENDENT_AMBULATORY_CARE_PROVIDER_SITE_OTHER): Payer: Medicare Other | Admitting: Family Medicine

## 2019-10-15 VITALS — Temp 99.0°F

## 2019-10-15 DIAGNOSIS — J329 Chronic sinusitis, unspecified: Secondary | ICD-10-CM | POA: Diagnosis not present

## 2019-10-15 LAB — HEMOGLOBIN A1C
Est. average glucose Bld gHb Est-mCnc: 126 mg/dL
Hgb A1c MFr Bld: 6 % — ABNORMAL HIGH (ref 4.8–5.6)

## 2019-10-15 MED ORDER — AZELASTINE HCL 0.1 % NA SOLN: 2.0000 | Freq: Two times a day (BID) | NASAL | 12 refills | Status: DC

## 2019-10-15 MED ORDER — AMOXICILLIN-POT CLAVULANATE 875-125 MG PO TABS: 1.0000 | ORAL_TABLET | Freq: Two times a day (BID) | ORAL | 0 refills | Status: DC

## 2019-10-15 NOTE — Progress Notes (Signed)
    Chief Complaint:  Carrie [name] is a 73 y.o. female who presents today for a virtual office visit with a chief complaint of sinusitis.   Assessment/Plan:  Sinusitis No red flags.  Will start Astelin nasal spray.  Continue Flonase.  Will send in "pocket prescription" for Augmentin with strict instruction not start unless symptoms worsen or not improve in the next several days.  Patient declined COVID-19 testing.  Discussed reasons to return to care.     Subjective:  HPI:  Sinusitis Started the past few days. Associated symptoms include rhinorrhea, headache, facial pain, and cough.   No fevers or chills. No shortness of breath or chest pain. No myalgias or sick contacts.  Feels similar to prior sinus infections.  No treatments tried.  No obvious aggravating or alleviating factors.  ROS: Per HPI  PMH: She reports that she quit smoking about 6 years ago. She has never used smokeless tobacco. She reports current alcohol use of about 6.0 standard drinks of alcohol per week. She reports that she does not use drugs.      Objective/Observations  Physical Exam: Gen: NAD, resting comfortably Pulm: Normal work of breathing Neuro: Grossly normal, moves all extremities Psych: Normal affect and thought content  No results found for this or any previous visit (from the past 24 hour(s)).   Virtual Visit via Video   I connected with Carrie [name] on 10/15/19 at  1:00 PM EST by a video enabled telemedicine application and verified that I am speaking with the correct person using two identifiers. I discussed the limitations of evaluation and management by telemedicine and the availability of in person appointments. The patient expressed understanding and agreed to proceed.   Patient location: Home Provider location: [place] participating in the virtual visit: Myself and Patient     Algis Greenhouse. Jerline Pain, MD 10/15/2019 1:17 PM

## 2019-10-23 ENCOUNTER — Other Ambulatory Visit: Payer: Self-pay

## 2019-10-24 ENCOUNTER — Ambulatory Visit (INDEPENDENT_AMBULATORY_CARE_PROVIDER_SITE_OTHER): Payer: Medicare Other | Admitting: Family Medicine

## 2019-10-24 ENCOUNTER — Encounter: Payer: Self-pay | Admitting: Family Medicine

## 2019-10-24 VITALS — BP 146/88 | HR 96 | Temp 98.2°F | Ht 63.5 in | Wt 186.0 lb

## 2019-10-24 DIAGNOSIS — G609 Hereditary and idiopathic neuropathy, unspecified: Secondary | ICD-10-CM

## 2019-10-24 DIAGNOSIS — M79672 Pain in left foot: Secondary | ICD-10-CM | POA: Diagnosis not present

## 2019-10-24 DIAGNOSIS — M8580 Other specified disorders of bone density and structure, unspecified site: Secondary | ICD-10-CM

## 2019-10-24 DIAGNOSIS — K219 Gastro-esophageal reflux disease without esophagitis: Secondary | ICD-10-CM

## 2019-10-24 DIAGNOSIS — M79671 Pain in right foot: Secondary | ICD-10-CM

## 2019-10-24 DIAGNOSIS — L659 Nonscarring hair loss, unspecified: Secondary | ICD-10-CM | POA: Diagnosis not present

## 2019-10-24 DIAGNOSIS — N183 Chronic kidney disease, stage 3 unspecified: Secondary | ICD-10-CM

## 2019-10-24 NOTE — Progress Notes (Signed)
   Chief Complaint:  Carrie [name] is a 73 y.o. female who presents today with a chief complaint of hair loss.   Assessment/Plan:  Hair Loss Female pattern baldness.  Will check labs today including CBC, C met, TSH, iron panel, B vitamins, and vitamin D.  Recommended over-the-counter minoxidil however patient will defer for the time being.  Bilateral foot pain May have component of peripheral neuropathy.  She also has evidence of stasis dermatitis.  Recommended compression stockings, frequent elevation, regular exercise, and salt avoidance.  Discussed starting gabapentin however patient declined.  Discussed reasons to return to care  GERD Was taking famotidine however stopped due to concern for hair loss.  It is possible her acid suppression could have caused issues with absorption of iron or vitamins.  We will check labs as above.  She will can taking Tums.  Seborrheic dermatitis No signs of significant flare today however it is possible her use of topical steroids could be contributing to hair loss. Advised to use only as needed sparingly.      Subjective:  HPI:  Hair Loss  Symptoms started a few months ago.  She has noticed that hairs are falling out all over her head.  She has had a sensation like someone is pulling her hair on the top of her head.  Some itching.  She has a history of seborrheic dermatitis and has been using her cortisone cream as needed for that.  No other treatments tried.  No other obvious aggravating or alleviating factors.  No obvious precipitating events.  She has also had some worsening foot pain over the last few months.  Located in bilateral feet.  Located on toes and throughout feet bilaterally.  She does have a history of varicose veins.  She has had some increased swelling and redness to her feet over the last couple of weeks as well.  No treatments tried.  No other obvious aggravating or alleviating factors.  ROS: Per HPI  PMH: She reports that she quit  smoking about 6 years ago. She has never used smokeless tobacco. She reports current alcohol use of about 6.0 standard drinks of alcohol per week. She reports that she does not use drugs.      Objective:  Physical Exam: BP (!) 146/88   Pulse 96   Temp 98.2 F (36.8 C)   Ht 5' 3.5" (1.613 m)   Wt 186 lb (84.4 kg)   SpO2 97%   BMI 32.43 kg/m   Gen: NAD, resting comfortably DERM: Slight thinning of hair noted on anterior aspect of scalp.  MSK: Bilateral feet with slight erythema along medial aspects.  Several varicosities noted in bilateral feet and ankles.  DP and PT pulses 2+ and symmetric bilaterally.  Neurovascular intact distally.     Algis Greenhouse. Jerline Pain, MD 10/24/2019 3:49 PM

## 2019-10-24 NOTE — Patient Instructions (Signed)
It was very nice to see you today!  Your foot discoloration and pain is likely due to venous stasis.  Please keep your legs elevated.  Use compression stockings as much as possible.  Please avoid salt.  Please stay as active as possible.  Let me know if you would like to try gabapentin.  We will check blood work today look for any other causes of hair loss.  You can try using minoxidil.  Take care, Dr Jerline Pain  Please try these tips to maintain a healthy lifestyle:   Eat at least 3 REAL meals and 1-2 snacks per day.  Aim for no more than 5 hours between eating.  If you eat breakfast, please do so within one hour of getting up.    Obtain twice as many fruits/vegetables as protein or carbohydrate foods for both lunch and dinner. (Half of each meal should be fruits/vegetables, one quarter protein, and one quarter starchy carbs)   Cut down on sweet beverages. This includes juice, soda, and sweet tea.    Exercise at least 150 minutes every week.

## 2019-10-25 LAB — COMPREHENSIVE METABOLIC PANEL
ALT: 18 U/L (ref 0–35)
AST: 20 U/L (ref 0–37)
Albumin: 4.2 g/dL (ref 3.5–5.2)
Alkaline Phosphatase: 55 U/L (ref 39–117)
BUN: 19 mg/dL (ref 6–23)
CO2: 30 mEq/L (ref 19–32)
Calcium: 9.3 mg/dL (ref 8.4–10.5)
Chloride: 104 mEq/L (ref 96–112)
Creatinine, Ser: 1.01 mg/dL (ref 0.40–1.20)
GFR: 53.49 mL/min — ABNORMAL LOW (ref >60.00)
Glucose, Bld: 107 mg/dL — ABNORMAL HIGH (ref 70–99)
Potassium: 4.4 mEq/L (ref 3.5–5.1)
Sodium: 141 mEq/L (ref 135–145)
Total Bilirubin: 0.4 mg/dL (ref 0.2–1.2)
Total Protein: 6.5 g/dL (ref 6.0–8.3)

## 2019-10-25 LAB — VITAMIN B12: Vitamin B-12: 497 pg/mL (ref 211–911)

## 2019-10-25 LAB — CBC
HCT: 37.8 % (ref 36.0–46.0)
Hemoglobin: 12.4 g/dL (ref 12.0–15.0)
MCHC: 32.8 g/dL (ref 30.0–36.0)
MCV: 90.3 fl (ref 78.0–100.0)
Platelets: 239 10*3/uL (ref 150.0–400.0)
RBC: 4.18 Mil/uL (ref 3.87–5.11)
RDW: 14.7 % (ref 11.5–15.5)
WBC: 6.3 10*3/uL (ref 4.0–10.5)

## 2019-10-25 LAB — VITAMIN D 25 HYDROXY (VIT D DEFICIENCY, FRACTURES): VITD: 34.23 ng/mL (ref 30.00–100.00)

## 2019-10-25 LAB — IBC + FERRITIN
Ferritin: 35.2 ng/mL (ref 10.0–291.0)
Iron: 41 ug/dL — ABNORMAL LOW (ref 42–145)
Saturation Ratios: 10.6 % — ABNORMAL LOW (ref 20.0–50.0)
Transferrin: 276 mg/dL (ref 212.0–360.0)

## 2019-10-25 LAB — TSH: TSH: 1.16 u[IU]/mL (ref 0.35–4.50)

## 2019-10-25 LAB — T4, FREE: Free T4: 0.78 ng/dL (ref 0.60–1.60)

## 2019-10-25 LAB — FOLATE: Folate: 18.4 ng/mL (ref >5.9)

## 2019-10-25 NOTE — Progress Notes (Signed)
Please inform patient of the following:  Iron levels are on the low end of normal but everything else is STABLE. No clear reason for her hair loss. As we discussed at her office, she can use rogaine if desired or we can continue to monitor. Do not need to do any other testing at this point.  Algis Greenhouse. Jerline Pain, MD 10/25/2019 1:13 PM

## 2019-11-05 ENCOUNTER — Ambulatory Visit: Payer: Medicare Other | Attending: Neurology | Admitting: Physical Therapy

## 2019-11-05 ENCOUNTER — Encounter: Payer: Self-pay | Admitting: Physical Therapy

## 2019-11-05 ENCOUNTER — Other Ambulatory Visit: Payer: Self-pay

## 2019-11-05 DIAGNOSIS — R42 Dizziness and giddiness: Secondary | ICD-10-CM | POA: Insufficient documentation

## 2019-11-05 DIAGNOSIS — H8111 Benign paroxysmal vertigo, right ear: Secondary | ICD-10-CM | POA: Insufficient documentation

## 2019-11-05 NOTE — Patient Instructions (Signed)
Self Treatment for Right Posterior / Anterior Canalithiasis    Sitting on bed: 1. Turn head 45 right. (a) Lie back slowly, shoulders on pillow, head on bed. (b) Hold _30___ seconds. 2. Keeping head on bed, turn head 90 left. Hold __30__ seconds. 3. Roll to left, head on 45 angle down toward bed. Hold ____ seconds. 4. Sit up on left side of bed. Repeat __3__ times per session. Do __2__ sessions per day.   TURN HEAD in position 3 so that you are looking down at floor.         Sit to Side-Lying    Sit on edge of bed. 1. Turn head 45 to right. 2. Maintain head position and lie down slowly on left side. Hold until symptoms subside. 3. Sit up slowly. Hold until symptoms subside. 4. Turn head 45 to left. 5. Maintain head position and lie down slowly on right side. Hold until symptoms subside. 6. Sit up slowly. Repeat sequence __5__ times per session. Do __3__ sessions per day.     How to Perform the Epley Maneuver The Epley maneuver is an exercise that relieves symptoms of vertigo. Vertigo is the feeling that you or your surroundings are moving when they are not. When you feel vertigo, you may feel like the room is spinning and have trouble walking. Dizziness is a little different than vertigo. When you are dizzy, you may feel unsteady or light-headed. You can do this maneuver at home whenever you have symptoms of vertigo. You can do it up to 3 times a day until your symptoms go away. Even though the Epley maneuver may relieve your vertigo for a few weeks, it is possible that your symptoms will return. This maneuver relieves vertigo, but it does not relieve dizziness. What are the risks? If it is done correctly, the Epley maneuver is considered safe. Sometimes it can lead to dizziness or nausea that goes away after a short time. If you develop other symptoms, such as changes in vision, weakness, or numbness, stop doing the maneuver and call your health care provider. How to perform the  Epley maneuver 1. Sit on the edge of a bed or table with your back straight and your legs extended or hanging over the edge of the bed or table. 2. Turn your head halfway toward the affected ear or side. 3. Lie backward quickly with your head turned until you are lying flat on your back. You may want to position a pillow under your shoulders. 4. Hold this position for 30 seconds. You may experience an attack of vertigo. This is normal. 5. Turn your head to the opposite direction until your unaffected ear is facing the floor. 6. Hold this position for 30 seconds. You may experience an attack of vertigo. This is normal. Hold this position until the vertigo stops. 7. Turn your whole body to the same side as your head. Hold for another 30 seconds. 8. Sit back up. You can repeat this exercise up to 3 times a day. Follow these instructions at home:  After doing the Epley maneuver, you can return to your normal activities.  Ask your health care provider if there is anything you should do at home to prevent vertigo. He or she may recommend that you: ? Keep your head raised (elevated) with two or more pillows while you sleep. ? Do not sleep on the side of your affected ear. ? Get up slowly from bed. ? Avoid sudden movements during the day. ?  Avoid extreme head movement, like looking up or bending over. Contact a health care provider if:  Your vertigo gets worse.  You have other symptoms, including: ? Nausea. ? Vomiting. ? Headache. Get help right away if:  You have vision changes.  You have a severe or worsening headache or neck pain.  You cannot stop vomiting.  You have new numbness or weakness in any part of your body. Summary  Vertigo is the feeling that you or your surroundings are moving when they are not.  The Epley maneuver is an exercise that relieves symptoms of vertigo.  If the Epley maneuver is done correctly, it is considered safe. You can do it up to 3 times a day.  This information is not intended to replace advice given to you by your health care provider. Make sure you discuss any questions you have with your health care provider. Document Released: 11/26/2013 Document Revised: 11/03/2017 Document Reviewed: 10/11/2016 Elsevier Patient Education  2020 [employer].

## 2019-11-06 DIAGNOSIS — Z85828 Personal history of other malignant neoplasm of skin: Secondary | ICD-10-CM | POA: Diagnosis not present

## 2019-11-06 DIAGNOSIS — L218 Other seborrheic dermatitis: Secondary | ICD-10-CM | POA: Diagnosis not present

## 2019-11-06 NOTE — Therapy (Signed)
[facility] [address] [place], [place], [address] Phone: [phone]   Fax:  [phone]  Physical Therapy Evaluation  Patient Details  Name: Carrie [name] MRN: [id number] Date of Birth: 08-26-1946 Referring Provider (PT): Dr. Sarina Ill   Encounter Date: 11/05/2019  PT End of Session - 11/06/19 2203    Visit Number  1    Authorization Type  medicare    Authorization Time Period  11-05-19 -02-02-20    PT Start Time  1147    PT Stop Time  1232    PT Time Calculation (min)  45 min    Activity Tolerance  Patient tolerated treatment well    Behavior During Therapy  [facility] for tasks assessed/performed       Past Medical History:  Diagnosis Date  . BPPV (benign paroxysmal positional vertigo), bilateral 04/11/2019  . Colon polyps   . Esophageal spasm   . Fatigue   . Frequent PVCs   . GERD (gastroesophageal reflux disease)   . IBS (irritable bowel syndrome)   . Palpitations   . Recurrent UTI   . Seborrheic dermatitis of scalp 03/06/13  . Vitamin B 12 deficiency     Past Surgical History:  Procedure Laterality Date  . CHOLECYSTECTOMY N/A 08/23/2016   Procedure: LAPAROSCOPIC CHOLECYSTECTOMY;  Surgeon: Rolm Bookbinder, MD;  Location: [facility];  Service: General;  Laterality: N/A;  . COLECTOMY Right 1998   large colonic polyp  . ENDOSCOPIC VEIN LASER TREATMENT Bilateral 2010-2011  . KNEE ARTHROSCOPY W/ MENISCECTOMY Left   . [place]  2001  . SPINE SURGERY     3 since 11/2010, laminectomy, May 2012 lumbar fusion, diskectomy    There were no vitals filed for this visit.       [facility] PT Assessment - 11/06/19 0001      Assessment   Medical Diagnosis  Vertigo    Referring Provider (PT)  Dr. Sarina Ill    Onset Date/Surgical Date  --   March 2020   Prior Therapy  pt reports a PT did Epley for the BPPV back in August which helped but did not completely resolve it       Precautions   Precautions   None      Balance Screen   Has the patient fallen in the past 6 months  No    Has the patient had a decrease in activity level because of a fear of falling?   No    Is the patient reluctant to leave their home because of a fear of falling?   No           Vestibular Assessment - 11/06/19 0001      Symptom Behavior   Subjective history of current problem  pt states she has not had any vertigo in past 2 weeks but says she is avoiding lying on her right side for fear that vertigo will start    Type of Dizziness   Not applicable    Frequency of Dizziness  varies - none reported in past 2 weeks    Duration of Dizziness  secs to minutes    Symptom Nature  Positional    Aggravating Factors  Rolling to right    Relieving Factors  Lying supine;Head stationary    Progression of Symptoms  Better    History of similar episodes  initial episode started in March 2020      Positional Testing   Dix-Hallpike  Dix-Hallpike Right;Dix-Hallpike Left  Dix-Hallpike Right   Dix-Hallpike Right Duration  none    Dix-Hallpike Right Symptoms  No nystagmus      Dix-Hallpike Left   Dix-Hallpike Left Duration  none    Dix-Hallpike Left Symptoms  No nystagmus          Objective measurements completed on examination: See above findings.              PT Education - 11/06/19 2201    Education Details  pt given info on BPPV etiology, Epley maneuver for self treatment and Nestor Lewandowsky exs prn should another episode of BPPV occur    Person(s) Educated  Patient    Methods  Explanation;Handout    Comprehension  Verbalized understanding;Returned demonstration          PT Long Term Goals - 11/06/19 2206      PT LONG TERM GOAL #1   Title  N/A as eval only - NO VERTIGO at this time             Plan - 11/06/19 2203    Clinical Impression Statement  Pt appears to have had episode of BPPV which has currently resolved - no vertigo and no nystagmus was provoked with any positional  testing at initial eval.  Pt reported that it has been approx. 2 weeks since she experienced any vertigo.    Personal Factors and Comorbidities  Comorbidity 1    Examination-Activity Limitations  Locomotion Level;Bed Mobility    Examination-Participation Restrictions  Driving;Cleaning    Stability/Clinical Decision Making  Stable/Uncomplicated    Clinical Decision Making  Low    Rehab Potential  Good    PT Frequency  One time visit    PT Treatment/Interventions  Patient/family education;ADLs/Self Care Home Management;Canalith Repostioning;Vestibular    PT Next Visit Plan  pt to call should BPPV re-occur - will hold for 30 days - will D/C if no vertigo    PT Home Exercise Plan  Epley and Brandt-Daroff prn    Consulted and Agree with Plan of Care  Patient       Patient will benefit from skilled therapeutic intervention in order to improve the following deficits and impairments:  Dizziness  Visit Diagnosis: Dizziness and giddiness - Plan: PT plan of care cert/re-cert  BPPV (benign paroxysmal positional vertigo), right - Plan: PT plan of care cert/re-cert     Problem List Patient Active Problem List   Diagnosis Date Noted  . Gait abnormality 10/14/2019  . Mixed hyperlipidemia 07/31/2019  . Vitamin D deficiency disease 05/01/2019  . Chronic renal disease, stage 3, moderately decreased glomerular filtration rate (GFR) between 30-59 mL/min/1.73 square meter 05/01/2019  . History of adenomatous polyp of colon 07/17/2018  . Bladder prolapse, female, acquired 09/21/2017  . IFG (impaired fasting glucose) 10/21/2016  . Gastro-esophageal reflux disease without esophagitis 10/20/2016  . Idiopathic peripheral neuropathy 04/08/2016  . Osteoarthritis of spine with radiculopathy, lumbar region 11/24/2015  . Osteopenia 05/28/2014  . Essential hypertension 03/05/2014  . Irritable bowel syndrome (IBS) 03/27/2013  . Former smoker 03/06/2013  . Seborrheic dermatitis of scalp 03/06/2013  .  Allergic rhinitis 01/03/2012  . Symptomatic PVCs 01/03/2012    Alda Lea, PT 11/06/2019, 10:09 PM  [facility] [address] [place], [place], [address] Phone: [phone]   Fax:  [phone]  Name: NICKY RAULSTON MRN: [id number] Date of Birth: [birth year]

## 2019-11-15 DIAGNOSIS — M4726 Other spondylosis with radiculopathy, lumbar region: Secondary | ICD-10-CM | POA: Diagnosis not present

## 2019-11-15 DIAGNOSIS — M48062 Spinal stenosis, lumbar region with neurogenic claudication: Secondary | ICD-10-CM | POA: Diagnosis not present

## 2019-11-15 DIAGNOSIS — M5136 Other intervertebral disc degeneration, lumbar region: Secondary | ICD-10-CM | POA: Diagnosis not present

## 2019-11-21 ENCOUNTER — Other Ambulatory Visit: Payer: Self-pay

## 2019-11-22 ENCOUNTER — Ambulatory Visit
Admission: RE | Admit: 2019-11-22 | Discharge: 2019-11-22 | Disposition: A | Discharge status: Home / Self Care | Payer: Medicare Other | Source: Ambulatory Visit | Attending: Family Medicine | Admitting: Family Medicine

## 2019-11-22 ENCOUNTER — Ambulatory Visit (INDEPENDENT_AMBULATORY_CARE_PROVIDER_SITE_OTHER): Payer: Medicare Other

## 2019-11-22 ENCOUNTER — Ambulatory Visit (INDEPENDENT_AMBULATORY_CARE_PROVIDER_SITE_OTHER): Payer: Medicare Other | Admitting: Family Medicine

## 2019-11-22 VITALS — BP 130/78 | Temp 97.8°F | Ht 64.0 in | Wt 189.0 lb

## 2019-11-22 VITALS — BP 130/78 | HR 84 | Temp 97.8°F | Ht 64.0 in | Wt 189.0 lb

## 2019-11-22 DIAGNOSIS — L659 Nonscarring hair loss, unspecified: Secondary | ICD-10-CM

## 2019-11-22 DIAGNOSIS — R269 Unspecified abnormalities of gait and mobility: Secondary | ICD-10-CM

## 2019-11-22 DIAGNOSIS — Z0000 Encounter for general adult medical examination without abnormal findings: Secondary | ICD-10-CM

## 2019-11-22 DIAGNOSIS — K580 Irritable bowel syndrome with diarrhea: Secondary | ICD-10-CM | POA: Diagnosis not present

## 2019-11-22 DIAGNOSIS — R7301 Impaired fasting glucose: Secondary | ICD-10-CM | POA: Diagnosis not present

## 2019-11-22 DIAGNOSIS — E782 Mixed hyperlipidemia: Secondary | ICD-10-CM | POA: Diagnosis not present

## 2019-11-22 DIAGNOSIS — I10 Essential (primary) hypertension: Secondary | ICD-10-CM | POA: Diagnosis not present

## 2019-11-22 DIAGNOSIS — E2839 Other primary ovarian failure: Secondary | ICD-10-CM

## 2019-11-22 DIAGNOSIS — M4726 Other spondylosis with radiculopathy, lumbar region: Secondary | ICD-10-CM

## 2019-11-22 DIAGNOSIS — Z1231 Encounter for screening mammogram for malignant neoplasm of breast: Secondary | ICD-10-CM

## 2019-11-22 NOTE — Progress Notes (Signed)
Subjective:   Carrie [name] is a 73 y.o. female who presents for Medicare Annual (Subsequent) preventive examination.  Review of Systems:   Cardiac Risk Factors include: advanced age (>29men, >25 women);hypertension    Objective:     Vitals: BP 130/78 (BP Location: Left Arm, Patient Position: Sitting, Cuff Size: Normal)   Temp 97.8 F (36.6 C) (Temporal)   Ht 5\' 4"  (1.626 m)   Wt 189 lb (85.7 kg)   BMI 32.44 kg/m   Body mass index is 32.44 kg/m.  Advanced Directives 11/22/2019 09/26/2017 08/22/2016  Does Patient Have a Medical Advance Directive? Yes Yes No  Type of Advance Directive Living will;Healthcare Power of [place];Living will -  Does patient want to make changes to medical advance directive? No - Patient declined - -  Copy of [place] in Chart? No - copy requested Yes -  Would patient like information on creating a medical advance directive? - - No - patient declined information    Tobacco Social History   Tobacco Use  Smoking Status Former Smoker  . Quit date: 02/02/2013  . Years since quitting: 6.8  Smokeless Tobacco Never Used     Counseling given: Not Answered   Clinical Intake:  Pre-visit preparation completed: Yes  Pain : No/denies pain  Diabetes: No  How often do you need to have someone help you when you read instructions, pamphlets, or other written materials from your doctor or pharmacy?: 1 - Never  Interpreter Needed?: No  Information entered by :: Denman George LPN  Past Medical History:  Diagnosis Date  . BPPV (benign paroxysmal positional vertigo), bilateral 04/11/2019  . Colon polyps   . Esophageal spasm   . Fatigue   . Frequent PVCs   . GERD (gastroesophageal reflux disease)   . IBS (irritable bowel syndrome)   . Palpitations   . Recurrent UTI   . Seborrheic dermatitis of scalp 03/06/13  . Vitamin B 12 deficiency    Past Surgical History:  Procedure Laterality Date  .  CHOLECYSTECTOMY N/A 08/23/2016   Procedure: LAPAROSCOPIC CHOLECYSTECTOMY;  Surgeon: Rolm Bookbinder, MD;  Location: [facility];  Service: General;  Laterality: N/A;  . COLECTOMY Right 1998   large colonic polyp  . ENDOSCOPIC VEIN LASER TREATMENT Bilateral 2010-2011  . KNEE ARTHROSCOPY W/ MENISCECTOMY Left   . [place]  2001  . SPINE SURGERY     3 since 11/2010, laminectomy, May 2012 lumbar fusion, diskectomy   Family History  Problem Relation Age of Onset  . Hypertension Mother   . High Cholesterol Mother   . Cancer Father        head and neck  . Hypertension Sister   . Hypertension Sister   . Diabetes type II Sister        was on steroids   . Hypertension Sister   . Hypertension Sister   . Cancer Sister        bladder  . Early death Neg Hx   . Diabetes Neg Hx   . Heart disease Neg Hx   . Kidney disease Neg Hx   . Stroke Neg Hx   . Breast cancer Neg Hx    Social History   Socioeconomic History  . Marital status: Married    Spouse name: Not on file  . Number of children: 2  . Years of education: 54  . Highest education level: Not on file  Occupational History  . Occupation: Retired Therapist, sports  Tobacco Use  . Smoking status: Former Smoker    Quit date: 02/02/2013    Years since quitting: 6.8  . Smokeless tobacco: Never Used  Substance and Sexual Activity  . Alcohol use: Yes    Alcohol/week: 6.0 standard drinks    Types: 6 Glasses of wine per week  . Drug use: No  . Sexual activity: Yes  Other Topics Concern  . Not on file  Social History Narrative   Lives in [place] with spouse.   Previously Volunteered as a Contractor   Caffeine use: none, quit at least 30 years ago   Social Determinants of Radio broadcast assistant Strain:   . Difficulty of Paying Living Expenses: Not on file  Food Insecurity:   . Worried About Charity fundraiser in the Last Year: Not on file  . Ran Out of Food in the Last Year: Not on file  Transportation Needs:   .  Lack of Transportation (Medical): Not on file  . Lack of Transportation (Non-Medical): Not on file  Physical Activity:   . Days of Exercise per Week: Not on file  . Minutes of Exercise per Session: Not on file  Stress:   . Feeling of Stress : Not on file  Social Connections:   . Frequency of Communication with Friends and Family: Not on file  . Frequency of Social Gatherings with Friends and Family: Not on file  . Attends Religious Services: Not on file  . Active Member of Clubs or Organizations: Not on file  . Attends Archivist Meetings: Not on file  . Marital Status: Not on file    Outpatient Encounter Medications as of 11/22/2019  Medication Sig  . atorvastatin (LIPITOR) 10 MG tablet Take 0.5 tablets (5 mg total) by mouth daily at 6 PM.  . azelastine (ASTELIN) 0.1 % nasal spray Place 2 sprays into both nostrils 2 (two) times daily.  . betamethasone dipropionate [id number] % lotion 1 application topical daily  . calcium carbonate (TUMS CALCIUM FOR LIFE BONE) 750 MG chewable tablet Chew 1 tablet by mouth daily.   . Cholecalciferol (VITAMIN D3) 2000 units TABS Take 1 tablet by mouth daily.  . Ciclopirox 1 % shampoo Apply 1 application topically daily as needed (itching).   . disopyramide (NORPACE CR) 150 MG 12 hr capsule Take 1 capsule (150 mg total) by mouth daily.  Marland Kitchen docusate sodium (COLACE) 100 MG capsule Take 100 mg by mouth daily.  . famotidine (PEPCID) 40 MG tablet Take 1 tablet (40 mg total) by mouth daily.  Marland Kitchen Hyoscyamine Sulfate SL (LEVSIN/SL) 0.125 MG SUBL Place 1 tablet under the tongue every 6 (six) hours as needed.  . sodium chloride (MURO 128) 2 % ophthalmic solution 1 drop.  Marland Kitchen tretinoin (RETIN-A) 0.025 % cream Apply 1 application topically every other day.   No facility-administered encounter medications on file as of 11/22/2019.    Activities of Daily Living In your present state of health, do you have any difficulty performing the following activities:  11/22/2019 11/22/2019  Hearing? N N  Vision? N N  Difficulty concentrating or making decisions? N N  Walking or climbing stairs? Y Y  Comment - knee and back  Dressing or bathing? N N  Doing errands, shopping? N N  Preparing Food and eating ? N -  Using the Toilet? N -  In the past six months, have you accidently leaked urine? N -  Do you have problems with loss of bowel  control? N -  Managing your Medications? N -  Managing your Finances? N -  Housekeeping or managing your Housekeeping? N -  Some recent data might be hidden    Patient Care Team: Leamon Arnt, MD as PCP - General (Family Medicine) Melvenia Beam, MD as Consulting Physician (Neurology) Thompson Grayer, MD as Consulting Physician (Cardiology) Jovita Gamma, MD as Consulting Physician (Neurosurgery) Richmond Campbell, MD as Consulting Physician (Gastroenterology) Jarome Matin, MD as Consulting Physician (Dermatology)    Assessment:   This is a routine wellness examination for Ciona.  Exercise Activities and Dietary recommendations Current Exercise Habits: The patient does not participate in regular exercise at present  Goals   None     Fall Risk Fall Risk  11/22/2019 07/31/2019 09/21/2017  Falls in the past year? 0 0 No  Number falls in past yr: - 0 -  Injury with Fall? 0 0 -  Risk for fall due to : Impaired balance/gait;Impaired mobility - -  Follow up Falls evaluation completed;Education provided;Falls prevention discussed Falls evaluation completed -   Is the patient's home free of loose throw rugs in walkways, pet beds, electrical cords, etc?   yes      Grab bars in the bathroom? yes      Handrails on the stairs?   yes      Adequate lighting?   yes  Timed Get Up and Go performed: completed and within normal timeframe; no gait abnormalities noted   Depression Screen PHQ 2/9 Scores 11/22/2019 07/31/2019 09/21/2017  PHQ - 2 Score 0 0 0     Cognitive Function- no cognitive concerns at this time    Cognitive Testing  Alert? Yes         Normal Appearance? Yes  Oriented to person? Yes           Place? Yes  Time? Yes  Recall of three objects? Yes  Can perform simple calculations? Yes  Displays appropriate judgment? Yes  Can read the correct time from a watch face? Yes   Immunization History  Administered Date(s) Administered  . Influenza, High Dose Seasonal PF 09/28/2015, 09/21/2017, 09/22/2018  . Influenza,inj,quad, With Preservative 09/04/2017  . Pneumococcal Conjugate-13 02/21/2015  . Pneumococcal Polysaccharide-23 03/02/2012, 07/31/2019  . Tdap 03/14/2012  . Zoster 01/16/2012  . Zoster Recombinat (Shingrix) 10/18/2019    Qualifies for Shingles Vaccine? Patient has completed #1 in series; aware that 2nd is needed   Screening Tests Health Maintenance  Topic Date Due  . MAMMOGRAM  11/20/2019  . DEXA SCAN  11/21/2021  . TETANUS/TDAP  03/14/2022  . COLONOSCOPY  06/24/2025  . INFLUENZA VACCINE  Completed  . Hepatitis C Screening  Completed  . PNA vac Low Risk Adult  Completed    Cancer Screenings: Lung: Low Dose CT Chest recommended if Age 6-80 years, 30 pack-year currently smoking OR have quit w/in 15years. Patient does not qualify. Breast:  Up to date on Mammogram? Yes   Up to date of Bone Density/Dexa? Yes Colorectal: colonoscopy 06/25/15     Plan:  I have personally reviewed and addressed the Medicare Annual Wellness questionnaire and have noted the following in the patient's chart:  A. Medical and social history B. Use of alcohol, tobacco or illicit drugs  C. Current medications and supplements D. Functional ability and status E.  Nutritional status F.  Physical activity G. Advance directives H. List of other physicians I.  Hospitalizations, surgeries, and ER visits in previous 12 months J.  Vitals K. Screenings  such as hearing and vision if needed, cognitive and depression L. Referrals, records requested, and appointments- none   In addition, I have  reviewed and discussed with patient certain preventive protocols, quality metrics, and best practice recommendations. A written personalized care plan for preventive services as well as general preventive health recommendations were provided to patient.   Signed,  Denman George, LPN  Nurse Health Advisor   Nurse Notes: no additional

## 2019-11-22 NOTE — Patient Instructions (Addendum)
[name] , Thank you for taking time to come for your Medicare Wellness Visit. I appreciate your ongoing commitment to your health goals. Please review the following plan we discussed and let me know if I can assist you in the future.   Screening recommendations/referrals: Colorectal Screening: up to date; colonoscopy 06/25/15 Mammogram: up to date; last 11/22/19 Bone Density: up to date; last 11/22/19  Vision and Dental Exams: Recommended annual ophthalmology exams for early detection of glaucoma and other disorders of the eye Recommended annual dental exams for proper oral hygiene  Vaccinations: Influenza vaccine: completed 08/22/19 Pneumococcal vaccine: up to date; last 07/31/19 Tdap vaccine: up to date; last 03/14/12  Shingles vaccine: Please complete series within 6 months   Advanced directives: Please bring a copy of your POA (Power of [place]) and/or Living Will to your next appointment.  Goals: Recommend to drink at least 6-8 8oz glasses of water per day and consume a balanced diet rich in fresh fruits and vegetables.   Next appointment: Please schedule your Annual Wellness Visit with your Nurse Health Advisor in one year.  Preventive Care 60 Years and Older, Female Preventive care refers to lifestyle choices and visits with your health care provider that can promote health and wellness. What does preventive care include?  A yearly physical exam. This is also called an annual well check.  Dental exams once or twice a year.  Routine eye exams. Ask your health care provider how often you should have your eyes checked.  Personal lifestyle choices, including:  Daily care of your teeth and gums.  Regular physical activity.  Eating a healthy diet.  Avoiding tobacco and drug use.  Limiting alcohol use.  Practicing safe sex.  Taking low-dose aspirin every day if recommended by your health care provider.  Taking vitamin and mineral supplements as recommended by your  health care provider. What happens during an annual well check? The services and screenings done by your health care provider during your annual well check will depend on your age, overall health, lifestyle risk factors, and family history of disease. Counseling  Your health care provider may ask you questions about your:  Alcohol use.  Tobacco use.  Drug use.  Emotional well-being.  Home and relationship well-being.  Sexual activity.  Eating habits.  History of falls.  Memory and ability to understand (cognition).  Work and work Statistician.  Reproductive health. Screening  You may have the following tests or measurements:  Height, weight, and BMI.  Blood pressure.  Lipid and cholesterol levels. These may be checked every 5 years, or more frequently if you are over 48 years old.  Skin check.  Lung cancer screening. You may have this screening every year starting at age 67 if you have a 30-pack-year history of smoking and currently smoke or have quit within the past 15 years.  Fecal occult blood test (FOBT) of the stool. You may have this test every year starting at age 6.  Flexible sigmoidoscopy or colonoscopy. You may have a sigmoidoscopy every 5 years or a colonoscopy every 10 years starting at age 66.  Hepatitis C blood test.  Hepatitis B blood test.  Sexually transmitted disease (STD) testing.  Diabetes screening. This is done by checking your blood sugar (glucose) after you have not eaten for a while (fasting). You may have this done every 1-3 years.  Bone density scan. This is done to screen for osteoporosis. You may have this done starting at age 56.  Mammogram. This  may be done every 1-2 years. Talk to your health care provider about how often you should have regular mammograms. Talk with your health care provider about your test results, treatment options, and if necessary, the need for more tests. Vaccines  Your health care provider may recommend  certain vaccines, such as:  Influenza vaccine. This is recommended every year.  Tetanus, diphtheria, and acellular pertussis (Tdap, Td) vaccine. You may need a Td booster every 10 years.  Zoster vaccine. You may need this after age 18.  Pneumococcal 13-valent conjugate (PCV13) vaccine. One dose is recommended after age 33.  Pneumococcal polysaccharide (PPSV23) vaccine. One dose is recommended after age 44. Talk to your health care provider about which screenings and vaccines you need and how often you need them. This information is not intended to replace advice given to you by your health care provider. Make sure you discuss any questions you have with your health care provider. Document Released: 12/18/2015 Document Revised: 08/10/2016 Document Reviewed: 09/22/2015 Elsevier Interactive Patient Education  2017 [place] Prevention in the Home Falls can cause injuries. They can happen to people of all ages. There are many things you can do to make your home safe and to help prevent falls. What can I do on the outside of my home?  Regularly fix the edges of walkways and driveways and fix any cracks.  Remove anything that might make you trip as you walk through a door, such as a raised step or threshold.  Trim any bushes or trees on the path to your home.  Use bright outdoor lighting.  Clear any walking paths of anything that might make someone trip, such as rocks or tools.  Regularly check to see if handrails are loose or broken. Make sure that both sides of any steps have handrails.  Any raised decks and porches should have guardrails on the edges.  Have any leaves, snow, or ice cleared regularly.  Use sand or salt on walking paths during winter.  Clean up any spills in your garage right away. This includes oil or grease spills. What can I do in the bathroom?  Use night lights.  Install grab bars by the toilet and in the tub and shower. Do not use towel bars as  grab bars.  Use non-skid mats or decals in the tub or shower.  If you need to sit down in the shower, use a plastic, non-slip stool.  Keep the floor dry. Clean up any water that spills on the floor as soon as it happens.  Remove soap buildup in the tub or shower regularly.  Attach bath mats securely with double-sided non-slip rug tape.  Do not have throw rugs and other things on the floor that can make you trip. What can I do in the bedroom?  Use night lights.  Make sure that you have a light by your bed that is easy to reach.  Do not use any sheets or blankets that are too big for your bed. They should not hang down onto the floor.  Have a firm chair that has side arms. You can use this for support while you get dressed.  Do not have throw rugs and other things on the floor that can make you trip. What can I do in the kitchen?  Clean up any spills right away.  Avoid walking on wet floors.  Keep items that you use a lot in easy-to-reach places.  If you need to reach something above  you, use a strong step stool that has a grab bar.  Keep electrical cords out of the way.  Do not use floor polish or wax that makes floors slippery. If you must use wax, use non-skid floor wax.  Do not have throw rugs and other things on the floor that can make you trip. What can I do with my stairs?  Do not leave any items on the stairs.  Make sure that there are handrails on both sides of the stairs and use them. Fix handrails that are broken or loose. Make sure that handrails are as long as the stairways.  Check any carpeting to make sure that it is firmly attached to the stairs. Fix any carpet that is loose or worn.  Avoid having throw rugs at the top or bottom of the stairs. If you do have throw rugs, attach them to the floor with carpet tape.  Make sure that you have a light switch at the top of the stairs and the bottom of the stairs. If you do not have them, ask someone to add them  for you. What else can I do to help prevent falls?  Wear shoes that:  Do not have high heels.  Have rubber bottoms.  Are comfortable and fit you well.  Are closed at the toe. Do not wear sandals.  If you use a stepladder:  Make sure that it is fully opened. Do not climb a closed stepladder.  Make sure that both sides of the stepladder are locked into place.  Ask someone to hold it for you, if possible.  Clearly mark and make sure that you can see:  Any grab bars or handrails.  First and last steps.  Where the edge of each step is.  Use tools that help you move around (mobility aids) if they are needed. These include:  Canes.  Walkers.  Scooters.  Crutches.  Turn on the lights when you go into a dark area. Replace any light bulbs as soon as they burn out.  Set up your furniture so you have a clear path. Avoid moving your furniture around.  If any of your floors are uneven, fix them.  If there are any pets around you, be aware of where they are.  Review your medicines with your doctor. Some medicines can make you feel dizzy. This can increase your chance of falling. Ask your doctor what other things that you can do to help prevent falls. This information is not intended to replace advice given to you by your health care provider. Make sure you discuss any questions you have with your health care provider. Document Released: 09/17/2009 Document Revised: 04/28/2016 Document Reviewed: 12/26/2014 Elsevier Interactive Patient Education  2017 [employer].

## 2019-11-22 NOTE — Patient Instructions (Signed)
Please return in 6 months for blood pressure recheck.   If you have any questions or concerns, please don't hesitate to send me a message via MyChart or call the office at [phone]. Thank you for visiting with [place] today! It's our pleasure caring for you.

## 2019-11-25 NOTE — Progress Notes (Signed)
Subjective  CC:  Chief Complaint  Patient presents with  . Hypertension  . Hyperlipidemia    HPI: Carrie [name] is a 73 y.o. female who presents to the office today to address the problems listed above in the chief complaint.  Hypertension f/u: Control is good . Pt reports she is doing well. taking medications as instructed, no medication side effects noted, no TIAs, no chest pain on exertion, no dyspnea on exertion, no swelling of ankles. She denies adverse effects from his BP medications. Compliance with medication is good.   HLD: well controlled on meds.   Back pain and weakness: has had epidural steroid injections and PT and is finally starting to feel a bit better. She is regaining her strength. Doing much better than 3 months ago.   Hair loss: now seeing derm and had steroid injections. Could be stress related due to her difficult last year. Sees Dr. Ronnald Ramp  IFG: she is eating better and working on weight loss. Has h/o DM many years back. In part due to her immobility over the last year.   IBS is stable  Gait abnormality: multifactorial; has seen Dr. Jaynee Eagles. Notes and recs reviewed.  Lab Results  Component Value Date   HGBA1C 6.0 (H) 10/14/2019    Assessment  1. Mixed hyperlipidemia   2. IFG (impaired fasting glucose)   3. Essential hypertension   4. Irritable bowel syndrome with diarrhea   5. Gait abnormality      Plan    Hypertension f/u: BP control is well controlled. Doing better now.   Hyperlipidemia f/u: well controlled.   IFG: will work on diet and I will monitor.   Back pain and gait abnormality: continue PT and strengthening. Improving. OA.   IBS - stable.  Education regarding management of these chronic disease states was given. Management strategies discussed on successive visits include dietary and exercise recommendations, goals of achieving and maintaining IBW, and lifestyle modifications aiming for adequate sleep and minimizing stressors.    Follow up: Return in about 6 months (around 05/22/2020) for follow up Hypertension, recheck.  No orders of the defined types were placed in this encounter.  No orders of the defined types were placed in this encounter.     BP Readings from Last 3 Encounters:  11/22/19 130/78  11/22/19 130/78  10/24/19 (!) 146/88   Wt Readings from Last 3 Encounters:  11/22/19 189 lb (85.7 kg)  11/22/19 189 lb (85.7 kg)  10/24/19 186 lb (84.4 kg)    Lab Results  Component Value Date   CHOL 180 07/31/2019   CHOL 201 (H) 07/25/2018   CHOL 187 03/08/2017   Lab Results  Component Value Date   HDL 67.40 07/31/2019   HDL 75 07/25/2018   HDL 72 03/08/2017   Lab Results  Component Value Date   LDLCALC 87 07/31/2019   LDLCALC 109 (H) 07/25/2018   LDLCALC 94 03/08/2017   Lab Results  Component Value Date   TRIG 125.0 07/31/2019   TRIG 83 07/25/2018   TRIG 105 03/08/2017   Lab Results  Component Value Date   CHOLHDL 3 07/31/2019   CHOLHDL 2.7 07/25/2018   CHOLHDL 2.6 03/08/2017   No results found for: LDLDIRECT Lab Results  Component Value Date   CREATININE 1.01 10/24/2019   BUN 19 10/24/2019   NA 141 10/24/2019   K 4.4 10/24/2019   CL 104 10/24/2019   CO2 30 10/24/2019    The 10-year ASCVD risk score (Goff [place]  Brooke Bonito., et al., 2013) is: 14.6%   Values used to calculate the score:     Age: 89 years     Sex: Female     Is Non-Hispanic African American: No     Diabetic: No     Tobacco smoker: No     Systolic Blood Pressure: [id number] mmHg     Is BP treated: No     HDL Cholesterol: 67.4 mg/dL     Total Cholesterol: 180 mg/dL  I reviewed the patients updated PMH, FH, and SocHx.    Patient Active Problem List   Diagnosis Date Noted  . Mixed hyperlipidemia 07/31/2019    Priority: High  . History of adenomatous polyp of colon 07/17/2018    Priority: High  . IFG (impaired fasting glucose) 10/21/2016    Priority: High  . Idiopathic peripheral neuropathy 04/08/2016    Priority:  High  . Essential hypertension 03/05/2014    Priority: High  . Chronic renal disease, stage 3, moderately decreased glomerular filtration rate (GFR) between 30-59 mL/min/1.73 square meter 05/01/2019    Priority: Medium  . Gastro-esophageal reflux disease without esophagitis 10/20/2016    Priority: Medium  . Osteoarthritis of spine with radiculopathy, lumbar region 11/24/2015    Priority: Medium  . Osteopenia 05/28/2014    Priority: Medium  . Irritable bowel syndrome (IBS) 03/27/2013    Priority: Medium  . Former smoker 03/06/2013    Priority: Medium  . Symptomatic PVCs 01/03/2012    Priority: Medium  . Vitamin D deficiency disease 05/01/2019    Priority: Low  . Bladder prolapse, female, acquired 09/21/2017    Priority: Low  . Seborrheic dermatitis of scalp 03/06/2013    Priority: Low  . Allergic rhinitis 01/03/2012    Priority: Low  . Gait abnormality 10/14/2019    Allergies: Phenergan [promethazine hcl], Erythromycin, Erythromycin base, Azithromycin, Epinephrine, and Nitrofurantoin  Social History: Patient  reports that she quit smoking about 6 years ago. She has never used smokeless tobacco. She reports current alcohol use of about 6.0 standard drinks of alcohol per week. She reports that she does not use drugs.  Current Meds  Medication Sig  . atorvastatin (LIPITOR) 10 MG tablet Take 0.5 tablets (5 mg total) by mouth daily at 6 PM.  . azelastine (ASTELIN) 0.1 % nasal spray Place 2 sprays into both nostrils 2 (two) times daily.  . betamethasone dipropionate [id number] % lotion 1 application topical daily  . Biotin 1000 MCG tablet Take 1,000 mcg by mouth daily.  . calcium carbonate (TUMS CALCIUM FOR LIFE BONE) 750 MG chewable tablet Chew 1 tablet by mouth daily.   . Cholecalciferol (VITAMIN D3) 2000 units TABS Take 1 tablet by mouth daily.  . Ciclopirox 1 % shampoo Apply 1 application topically daily as needed (itching).   . disopyramide (NORPACE CR) 150 MG 12 hr capsule Take 1  capsule (150 mg total) by mouth daily.  Marland Kitchen docusate sodium (COLACE) 100 MG capsule Take 100 mg by mouth daily.  . famotidine (PEPCID) 40 MG tablet Take 1 tablet (40 mg total) by mouth daily.  Marland Kitchen Hyoscyamine Sulfate SL (LEVSIN/SL) 0.125 MG SUBL Place 1 tablet under the tongue every 6 (six) hours as needed.  . Multiple Vitamin (MULTIVITAMIN) tablet Take 1 tablet by mouth daily.  . sodium chloride (MURO 128) 2 % ophthalmic solution 1 drop.  Marland Kitchen tretinoin (RETIN-A) 0.025 % cream Apply 1 application topically every other day.    Review of Systems: Cardiovascular: negative for chest pain, palpitations, leg swelling,  orthopnea Respiratory: negative for SOB, wheezing or persistent cough Gastrointestinal: negative for abdominal pain Genitourinary: negative for dysuria or gross hematuria  Objective  Vitals: BP 130/78 (BP Location: Left Arm, Patient Position: Sitting, Cuff Size: Normal)   Pulse 84   Temp 97.8 F (36.6 C) (Temporal)   Ht 5\' 4"  (1.626 m)   Wt 189 lb (85.7 kg)   SpO2 98%   BMI 32.44 kg/m  General: no acute distress  Psych:  Alert and oriented, normal mood and affect HEENT:  Normocephalic, atraumatic, supple neck  Cardiovascular:  RRR without murmur. no edema Respiratory:  Good breath sounds bilaterally, CTAB with normal respiratory effort Skin:  Warm, no rashes Neurologic:   Mental status is normal  Commons side effects, risks, benefits, and alternatives for medications and treatment plan prescribed today were discussed, and the patient expressed understanding of the given instructions. Patient is instructed to call or message via MyChart if he/she has any questions or concerns regarding our treatment plan. No barriers to understanding were identified. We discussed Red Flag symptoms and signs in detail. Patient expressed understanding regarding what to do in case of urgent or emergency type symptoms.   Medication list was reconciled, printed and provided to the patient in AVS.  Patient instructions and summary information was reviewed with the patient as documented in the AVS. This note was prepared with assistance of Dragon voice recognition software. Occasional wrong-word or sound-a-like substitutions may have occurred due to the inherent limitations of voice recognition software  This visit occurred during the SARS-CoV-2 public health emergency.  Safety protocols were in place, including screening questions prior to the visit, additional usage of staff PPE, and extensive cleaning of exam room while observing appropriate contact time as indicated for disinfecting solutions.

## 2019-12-02 ENCOUNTER — Ambulatory Visit (INDEPENDENT_AMBULATORY_CARE_PROVIDER_SITE_OTHER): Payer: Medicare Other | Admitting: Family Medicine

## 2019-12-02 ENCOUNTER — Other Ambulatory Visit: Payer: Self-pay

## 2019-12-02 ENCOUNTER — Encounter: Payer: Self-pay | Admitting: Family Medicine

## 2019-12-02 VITALS — BP 146/80 | HR 89 | Temp 97.0°F | Ht 64.0 in

## 2019-12-02 DIAGNOSIS — B070 Plantar wart: Secondary | ICD-10-CM

## 2019-12-02 DIAGNOSIS — M79672 Pain in left foot: Secondary | ICD-10-CM

## 2019-12-02 MED ORDER — HYDROCODONE-ACETAMINOPHEN 5-325 MG PO TABS: 1.0000 | ORAL_TABLET | Freq: Four times a day (QID) | ORAL | 0 refills | Status: DC | PRN

## 2019-12-02 NOTE — Patient Instructions (Signed)
It was very nice to see you today!  I think you have a plantar wart.  We froze the area today.  Please use the pain medication if needed.  Let me know or let Dr. Jonni Sanger know if not improving.  Take care, Dr Jerline Pain  Please try these tips to maintain a healthy lifestyle:   Eat at least 3 REAL meals and 1-2 snacks per day.  Aim for no more than 5 hours between eating.  If you eat breakfast, please do so within one hour of getting up.    Each meal should contain half fruits/vegetables, one quarter protein, and one quarter carbs (no bigger than a computer mouse)   Cut down on sweet beverages. This includes juice, soda, and sweet tea.     Drink at least 1 glass of water with each meal and aim for at least 8 glasses per day   Exercise at least 150 minutes every week.

## 2019-12-02 NOTE — Progress Notes (Signed)
   Chief Complaint:  Carrie [name] is a 73 y.o. female who presents today with a chief complaint of foot pain   Assessment/Plan:  Foot Pain Due to plantar wart.  Cryotherapy applied today-see below procedure note.  She tolerated well.  Given her exquisite tenderness that interferes with her ambulation in addition to procedure performed below, will give short supply of Norco for pain management.  Discussed reasons return to care.  Follow-up as needed.    Subjective:  HPI:  Foot Pain Started 4 days ago. Located on left foot. Thinks she may have gotten a piece of sandpaper stuck in her foot. Very painful with walking. No drainage. No treatments tried.  Worsening.   ROS: Per HPI  PMH: She reports that she quit smoking about 6 years ago. She has never used smokeless tobacco. She reports current alcohol use of about 6.0 standard drinks of alcohol per week. She reports that she does not use drugs.      Objective:  Physical Exam: BP (!) 146/80   Pulse 89   Temp (!) 97 F (36.1 C)   Ht 5\' 4"  (1.626 m)   SpO2 99%   BMI 32.44 kg/m   Gen: NAD, resting comfortably MSK: Left first MTP joint with hyperkeratotic lesion and central black dot.   Cryotherapy Procedure Note  Pre-operative Diagnosis: Plantar Wart  Locations: Left Foot  Indications: Therapeutic  Procedure Details  Patient informed of risks (permanent scarring, infection, light or dark discoloration, bleeding, infection, weakness, numbness and recurrence of the lesion) and benefits of the procedure and verbal informed consent obtained.  The areas are treated with liquid nitrogen therapy, frozen until ice ball extended 3 mm beyond lesion, allowed to thaw, and treated again.  The area was then debrided with a #15 blade scalpel.  Another round of freeze-thaw-freeze-thaw was then repeated.  No apparent foreign bodies with debridement with a scalpel.  The patient tolerated procedure well.  The patient was instructed on post-op care,  warned that there may be blister formation, redness and pain. Recommend OTC analgesia as needed for pain.  Condition: Stable  Complications: none.       Algis Greenhouse. Jerline Pain, MD 12/02/2019 3:18 PM

## 2019-12-10 ENCOUNTER — Ambulatory Visit: Payer: Medicare Other | Attending: Neurology | Admitting: Physical Therapy

## 2019-12-10 ENCOUNTER — Other Ambulatory Visit: Payer: Self-pay

## 2019-12-10 ENCOUNTER — Encounter: Payer: Self-pay | Admitting: Physical Therapy

## 2019-12-10 ENCOUNTER — Ambulatory Visit: Payer: Medicare Other | Attending: Neurosurgery

## 2019-12-10 DIAGNOSIS — R2689 Other abnormalities of gait and mobility: Secondary | ICD-10-CM | POA: Diagnosis not present

## 2019-12-10 DIAGNOSIS — R293 Abnormal posture: Secondary | ICD-10-CM

## 2019-12-10 DIAGNOSIS — M545 Low back pain: Secondary | ICD-10-CM | POA: Diagnosis not present

## 2019-12-10 DIAGNOSIS — M6281 Muscle weakness (generalized): Secondary | ICD-10-CM | POA: Insufficient documentation

## 2019-12-10 DIAGNOSIS — H8111 Benign paroxysmal vertigo, right ear: Secondary | ICD-10-CM | POA: Insufficient documentation

## 2019-12-10 DIAGNOSIS — G8929 Other chronic pain: Secondary | ICD-10-CM | POA: Insufficient documentation

## 2019-12-10 NOTE — Patient Instructions (Addendum)
Access Code: [facility]  URL: [email or web address]  Date: 12/10/2019  Prepared by: Sigurd Sos   Exercises Seated Long Arc Quad - 10 reps - 2 sets - 5 hold - 3x daily - 7x weekly Seated March - 10 reps - 3 sets - 3x daily - 7x weekly Seated Heel Toe Raises - 10 reps - 2 sets - 3x daily - 7x weekly Seated Isometric Hip Adduction with Ball - 10 reps - 2 sets - 3x daily - 7x weekly Correct Standing Posture - 10 reps - 3 sets - 1x daily - 7x weekly   WALKING  Walking is a great form of exercise to increase your strength, endurance and overall fitness.  A walking program can help you start slowly and gradually build endurance as you go.  Everyone's ability is different, so each person's starting point will be different.  You do not have to follow them exactly.  The are just samples. You should simply find out what's right for you and stick to that program.   In the beginning, you'll start off walking 2-3 times a day for short distances.  As you get stronger, you'll be walking further at just 1-2 times per day.  A. You Can Walk For A Certain Length Of Time Each Day    Walk 5 minutes 3 times per day.  Increase 2 minutes every 2 days (3 times per day).  Work up to 25-30 minutes (1-2 times per day).   Example:   Day 1-2 5 minutes 3 times per day   Day 7-8 12 minutes 2-3 times per day   Day 13-14 25 minutes 1-2 times per day  B. You Can Walk For a Certain Distance Each Day     Distance can be substituted for time.    Example:   3 trips to mailbox (at road)   3 trips to corner of block   3 trips around the block    [facility] [address] [place], [place] [address] Phone # [phone] Fax [phone]

## 2019-12-10 NOTE — Therapy (Signed)
[facility] [address] [place], [place], [address] Phone: [phone]   Fax:  [phone]   Physical Therapy Evaluation  Patient Details  Name: Carrie [name] MRN: [id number] Date of Birth: July 01, 1946 Referring Provider (PT): Jovita Gamma, MD   Encounter Date: 12/10/2019  PT End of Session - 12/10/19 1142    Visit Number  2   1- lumbar 1-BPPV   Date for PT Re-Evaluation  02/04/20    Authorization Type  medicare    PT Start Time  1102    PT Stop Time  1142    PT Time Calculation (min)  40 min    Activity Tolerance  Patient tolerated treatment well    Behavior During Therapy  [facility] for tasks assessed/performed       Past Medical History:  Diagnosis Date  . BPPV (benign paroxysmal positional vertigo), bilateral 04/11/2019  . Colon polyps   . Esophageal spasm   . Fatigue   . Frequent PVCs   . GERD (gastroesophageal reflux disease)   . IBS (irritable bowel syndrome)   . Palpitations   . Recurrent UTI   . Seborrheic dermatitis of scalp 03/06/13  . Vitamin B 12 deficiency     Past Surgical History:  Procedure Laterality Date  . CHOLECYSTECTOMY N/A 08/23/2016   Procedure: LAPAROSCOPIC CHOLECYSTECTOMY;  Surgeon: Rolm Bookbinder, MD;  Location: [facility];  Service: General;  Laterality: N/A;  . COLECTOMY Right 1998   large colonic polyp  . ENDOSCOPIC VEIN LASER TREATMENT Bilateral 2010-2011  . KNEE ARTHROSCOPY W/ MENISCECTOMY Left   . [place]  2001  . SPINE SURGERY     3 since 11/2010, laminectomy, May 2012 lumbar fusion, diskectomy    There were no vitals filed for this visit.   Subjective Assessment - 12/10/19 1106    Subjective  Pt presents to PT with chronic back pain.  Pt had a knee arthroscopic surgery 1 year ago and had a lot of time in bed due to complications of tibia fracture.  Pt reports that time in bed and use of walker flared-up her pain. Pt reports signficant weakness in the core and  hips.    Pertinent History  lumbar surgeries: 11/2010 and 01/2011 discectomy surgeries.  L4-5 lumbar fusion 2012.  Lt knee surgery: 12/18/2018    How long can you walk comfortably?  10 minutes- limited by fatigue and pain    Diagnostic tests  MRI: September 2020- spinal stenosis    Patient Stated Goals  improve strength, LE endurance and reduce pain    Currently in Pain?  Yes    Pain Score  5    with movement   Pain Location  Back    Pain Orientation  Lower;Left;Right    Pain Descriptors / Indicators  Aching;Sore    Pain Type  Chronic pain    Pain Onset  More than a month ago    Pain Frequency  Constant    Aggravating Factors   activity, as the day progresses, putting on shoes/socks    Pain Relieving Factors  Tylenol, heat, rest         [facility] PT Assessment - 12/10/19 0001      Assessment   Medical Diagnosis  lumbar stenosis with neurogenic claudication    Referring Provider (PT)  Jovita Gamma, MD    Onset Date/Surgical Date  12/09/18    Next MD Visit  01/2020      Precautions   Precautions  Other (comment)  osteopenia     Restrictions   Weight Bearing Restrictions  No      Balance Screen   Has the patient fallen in the past 6 months  No    Has the patient had a decrease in activity level because of a fear of falling?   No    Is the patient reluctant to leave their home because of a fear of falling?   No      Home Environment   Living Environment  --    Additional Comments  [place]- Independent living with her husband      Prior Function   Level of Independence  Independent    Vocation  Retired    Leisure  pool when able, walking for exercise, reading, baking      Cognition   Overall Cognitive Status  Within Functional Limits for tasks assessed      Observation/Other Assessments   Focus on Therapeutic Outcomes (FOTO)   57% limitation      Posture/Postural Control   Posture/Postural Control  Postural limitations    Postural Limitations  Forward  head;Rounded Shoulders;Flexed trunk      ROM / Strength   AROM / PROM / Strength  AROM;PROM;Strength      AROM   Overall AROM   Deficits    Overall AROM Comments  lumbar A/ROM is limited by 50% in all directions with mild pain at end range of each      PROM   Overall PROM   Deficits    Overall PROM Comments  hip flexibility limited by 25%       Strength   Overall Strength  Deficits    Overall Strength Comments  Bil hip flexibility 4-/5, knees 4/5, ankles 4-/5      Palpation   Palpation comment  diffuse palpable tenderness over bil lumbar paraspinals and Lt>Rt gluteals      Transfers   Transfers  Stand to Sit;Sit to Stand    Sit to Stand  With upper extremity assist    Five time sit to stand comments   12.45 seconds    Stand to Sit  With upper extremity assist      Ambulation/Gait   Ambulation/Gait  Yes    Gait Pattern  Step-through pattern;Decreased stride length;Poor foot clearance - left;Poor foot clearance - right                Objective measurements completed on examination: See above findings.              PT Education - 12/10/19 1141    Education Details  Access Code: [facility]    Person(s) Educated  Patient    Methods  Explanation;Demonstration    Comprehension  Verbalized understanding;Returned demonstration       PT Short Term Goals - 12/10/19 1231      PT SHORT TERM GOAL #1   Title  be independent in initial HEP    Time  4    Period  Weeks    Status  New    Target Date  01/07/20      PT SHORT TERM GOAL #2   Title  perform 5x sit to stand in < or = to 11.5 seconds to reduce falls risk    Time  4    Period  Weeks    Status  New    Target Date  01/07/20      PT SHORT TERM GOAL #3   Title  demonstrate heel  strike bilaterally with gait on level surface > or = to 50% of the time to reduce falls risk    Time  4    Period  Weeks    Status  New    Target Date  01/07/20      PT SHORT TERM GOAL #4   Title  improve endurance to walk  for 10-15 minutes without significant fatigue to improve community endurance    Time  4    Period  Weeks    Status  New    Target Date  01/07/20        PT Long Term Goals - 12/10/19 1315      PT LONG TERM GOAL #1   Title  be independent in advanced HEP    Time  8    Period  Weeks    Status  New    Target Date  02/04/20      PT LONG TERM GOAL #2   Title  demonstrate t to 4+/5 bil hip and knee strength to improve endurance for community activity    Time  8    Period  Weeks    Status  New    Target Date  02/04/20      PT LONG TERM GOAL #3   Title  reduce FOTO to < or = to 47% limitation    Time  8    Period  Weeks    Status  New    Target Date  02/04/20      PT LONG TERM GOAL #4   Title  perform regular walking for exercise and verbalize understanding of safe progression    Time  8    Period  Weeks    Status  New    Target Date  02/04/20      PT LONG TERM GOAL #5   Title  improve LE strength and endurance to walk for 20-25 minutes without limitation    Time  8    Period  Weeks    Status  New    Target Date  02/04/20      Additional Long Term Goals   Additional Long Term Goals  Yes      PT LONG TERM GOAL #6   Title  report a 50% reduction in LBP with standing and walking to improve safety and independepence in the community    Time  8    Period  Weeks    Status  New    Target Date  02/04/20             Plan - 12/10/19 1321    Clinical Impression Statement  Pt reports to PT with complaints of chronic pain, weakness and fatigue lasting 1 year.  Pt has history of 3 lumbar surgeries with most recent in 2012.  Pt had a knee surgery last year and had to stay in bed for an extended period.  Pt reports that her endurance and strength have declined and she is limited to < 10 minutes of walking due to this.  Pt reports LBP with standing and walking and musculature fatigue with community activity.  Pt would like to return to regular walking and exercise.  Pt  demonstrates bilateral LE weakness, is a falls risk due to poor foot clearance and shortened step length with gait and demonstrates reduced lumbar A/ROM in all directions.  Pt will benefit from skilled PT to address lumbar pain, functional weakness, advance exercises for the pool (at [place]),  and flexibility.    Personal Factors and Comorbidities  Comorbidity 2    Comorbidities  3 lumbar surgeries, Lt knee surgery    Examination-Activity Limitations  Caring for Others;Carry;Squat;Stand;Stairs;Locomotion Level    Examination-Participation Restrictions  Community Activity;Meal Prep;Cleaning;Laundry;Shop    Stability/Clinical Decision Making  Evolving/Moderate complexity    Clinical Decision Making  Moderate    Rehab Potential  Good    PT Frequency  2x / week    PT Duration  8 weeks    PT Treatment/Interventions  Patient/family education;ADLs/Self Care Home Management;Moist Heat;Electrical Stimulation;Stair training;Gait training;Functional mobility training;Neuromuscular re-education;Balance training;Therapeutic exercise;Therapeutic activities;Manual techniques;Taping;Dry needling;Passive range of motion    PT Next Visit Plan  advise on pool exercises, work on balance, gait, LE/hip strength    PT Home Exercise Plan  Access Code: [facility]    Consulted and Agree with Plan of Care  Patient       Patient will benefit from skilled therapeutic intervention in order to improve the following deficits and impairments:  Abnormal gait, Decreased activity tolerance, Decreased strength, Impaired flexibility, Difficulty walking, Decreased mobility, Decreased endurance, Improper body mechanics, Postural dysfunction, Increased muscle spasms  Visit Diagnosis: Muscle weakness (generalized) - Plan: PT plan of care cert/re-cert  Chronic bilateral low back pain without sciatica - Plan: PT plan of care cert/re-cert  Abnormal posture - Plan: PT plan of care cert/re-cert  Other abnormalities of gait and mobility  - Plan: PT plan of care cert/re-cert     Problem List Patient Active Problem List   Diagnosis Date Noted  . Gait abnormality 10/14/2019  . Mixed hyperlipidemia 07/31/2019  . Vitamin D deficiency disease 05/01/2019  . Chronic renal disease, stage 3, moderately decreased glomerular filtration rate (GFR) between 30-59 mL/min/1.73 square meter 05/01/2019  . History of adenomatous polyp of colon 07/17/2018  . Bladder prolapse, female, acquired 09/21/2017  . IFG (impaired fasting glucose) 10/21/2016  . Gastro-esophageal reflux disease without esophagitis 10/20/2016  . Idiopathic peripheral neuropathy 04/08/2016  . Osteoarthritis of spine with radiculopathy, lumbar region 11/24/2015  . Osteopenia 05/28/2014  . Essential hypertension 03/05/2014  . Irritable bowel syndrome (IBS) 03/27/2013  . Former smoker 03/06/2013  . Seborrheic dermatitis of scalp 03/06/2013  . Allergic rhinitis 01/03/2012  . Symptomatic PVCs 01/03/2012    Sigurd Sos, PT 12/10/19 1:27 PM  [facility] [address] [place], [place], [address] Phone: [phone]   Fax:  [phone]  Name: SHAWDA CHANNING MRN: [id number] Date of Birth: June 26, 1946

## 2019-12-11 NOTE — Therapy (Signed)
[facility] [address] [place], [place], [address] Phone: [phone]   Fax:  [phone]  Physical Therapy Treatment  Patient Details  Name: Carrie [name] MRN: [id number] Date of Birth: 1946/06/17 Referring Provider (PT): Jovita Gamma, MD   Encounter Date: 12/10/2019  PT End of Session - 12/11/19 1203    Visit Number  3   pt has had 2 visits for BPPV as of 12-10-19 (no additional visits needed for vertigo at this time)   Date for PT Re-Evaluation  02/04/20    Authorization Type  medicare    PT Start Time  [id number]    PT Stop Time  [id number]   session ended early due to no vertigo provoked   PT Time Calculation (min)  18 min    Activity Tolerance  Patient tolerated treatment well    Behavior During Therapy  [facility] for tasks assessed/performed       Past Medical History:  Diagnosis Date  . BPPV (benign paroxysmal positional vertigo), bilateral 04/11/2019  . Colon polyps   . Esophageal spasm   . Fatigue   . Frequent PVCs   . GERD (gastroesophageal reflux disease)   . IBS (irritable bowel syndrome)   . Palpitations   . Recurrent UTI   . Seborrheic dermatitis of scalp 03/06/13  . Vitamin B 12 deficiency     Past Surgical History:  Procedure Laterality Date  . CHOLECYSTECTOMY N/A 08/23/2016   Procedure: LAPAROSCOPIC CHOLECYSTECTOMY;  Surgeon: Rolm Bookbinder, MD;  Location: [facility];  Service: General;  Laterality: N/A;  . COLECTOMY Right 1998   large colonic polyp  . ENDOSCOPIC VEIN LASER TREATMENT Bilateral 2010-2011  . KNEE ARTHROSCOPY W/ MENISCECTOMY Left   . [place]  2001  . SPINE SURGERY     3 since 11/2010, laminectomy, May 2012 lumbar fusion, diskectomy    There were no vitals filed for this visit.  Subjective Assessment - 12/11/19 1157    Subjective  Pt was seen initially on 11-05-19 for treatment of BPPV; pt states she did well until mid-Dec. when she rolled onto her right side and experienced  vertigo; pt states she has been too scared to attempt rolling onto Rt side since that time due to fear of vertigo occurring.  Pt reports she has only been sleeping on her left side for past 2-3 weeks and wants to be able to roll onto and sleep on her Rt side without experiencing vertigo.    Pertinent History  lumbar surgeries: 11/2010 and 01/2011 discectomy surgeries.  L4-5 lumbar fusion 2012.  Lt knee surgery: 12/18/2018    How long can you walk comfortably?  10 minutes- limited by fatigue and pain    Diagnostic tests  MRI: September 2020- spinal stenosis    Patient Stated Goals  improve strength, LE endurance and reduce pain    Currently in Pain?  Yes   pt had ortho PT eval for back pain earlier today   Pain Score  5     Pain Location  Back    Pain Orientation  Lower    Pain Descriptors / Indicators  Aching;Sore    Pain Type  Chronic pain    Pain Onset  More than a month ago             Vestibular Assessment - 12/11/19 0001      Positional Testing   Dix-Hallpike  Dix-Hallpike Right;Dix-Hallpike Left    Sidelying Test  Sidelying Right;Sidelying Left  Dix-Hallpike Right   Dix-Hallpike Right Duration  none    Dix-Hallpike Right Symptoms  No nystagmus      Dix-Hallpike Left   Dix-Hallpike Left Duration  none    Dix-Hallpike Left Symptoms  No nystagmus      Sidelying Right   Sidelying Right Duration  none    Sidelying Right Symptoms  No nystagmus      Sidelying Left   Sidelying Left Duration  none    Sidelying Left Symptoms  No nystagmus       No vertigo provoked with any positional testing; no nystagmus noted with any testing                PT Education - 12/11/19 1202    Education Details  reviewed Brandt-Daroff exercises and Epley maneuver for self treatment prn should vertigo re-occur    Person(s) Educated  Patient    Methods  Explanation    Comprehension  Verbalized understanding       PT Short Term Goals - 12/10/19 1231      PT SHORT TERM  GOAL #1   Title  be independent in initial HEP    Time  4    Period  Weeks    Status  New    Target Date  01/07/20      PT SHORT TERM GOAL #2   Title  perform 5x sit to stand in < or = to 11.5 seconds to reduce falls risk    Time  4    Period  Weeks    Status  New    Target Date  01/07/20      PT SHORT TERM GOAL #3   Title  demonstrate heel strike bilaterally with gait on level surface > or = to 50% of the time to reduce falls risk    Time  4    Period  Weeks    Status  New    Target Date  01/07/20      PT SHORT TERM GOAL #4   Title  improve endurance to walk for 10-15 minutes without significant fatigue to improve community endurance    Time  4    Period  Weeks    Status  New    Target Date  01/07/20        PT Long Term Goals - 12/10/19 1315      PT LONG TERM GOAL #1   Title  be independent in advanced HEP    Time  8    Period  Weeks    Status  New    Target Date  02/04/20      PT LONG TERM GOAL #2   Title  demonstrate t to 4+/5 bil hip and knee strength to improve endurance for community activity    Time  8    Period  Weeks    Status  New    Target Date  02/04/20      PT LONG TERM GOAL #3   Title  reduce FOTO to < or = to 47% limitation    Time  8    Period  Weeks    Status  New    Target Date  02/04/20      PT LONG TERM GOAL #4   Title  perform regular walking for exercise and verbalize understanding of safe progression    Time  8    Period  Weeks    Status  New    Target Date  02/04/20      PT LONG TERM GOAL #5   Title  improve LE strength and endurance to walk for 20-25 minutes without limitation    Time  8    Period  Weeks    Status  New    Target Date  02/04/20      Additional Long Term Goals   Additional Long Term Goals  Yes      PT LONG TERM GOAL #6   Title  report a 50% reduction in LBP with standing and walking to improve safety and independepence in the community    Time  8    Period  Weeks    Status  New    Target Date  02/04/20             Plan - 12/11/19 1207    Clinical Impression Statement  Pt presents with no signs or symptoms of BPPV at this time; no nystagmus noted with any positional testing and pt reported no vertigo with any movements or positional testing.  Pt states she did experience vertigo approx. 2 weeks ago when she rolled onto her right side and states she has avoided lying on her Rt side since that time due to fear of vertigo occurring again.  Pt states she she was too scared to test it at home for fear it would occur.  Pt has no active BPPV episode at this time - no follow up needed due to no vertigo.    Personal Factors and Comorbidities  Comorbidity 2    Comorbidities  3 lumbar surgeries, Lt knee surgery    Examination-Activity Limitations  Caring for Others;Carry;Squat;Stand;Stairs;Locomotion Level    Examination-Participation Restrictions  Community Activity;Meal Prep;Cleaning;Laundry;Shop    Stability/Clinical Decision Making  Evolving/Moderate complexity    Rehab Potential  Good    PT Frequency  2x / week    PT Duration  8 weeks    PT Treatment/Interventions  Patient/family education;ADLs/Self Care Home Management;Moist Heat;Electrical Stimulation;Stair training;Gait training;Functional mobility training;Neuromuscular re-education;Balance training;Therapeutic exercise;Therapeutic activities;Manual techniques;Taping;Dry needling;Passive range of motion    PT Next Visit Plan  advise on pool exercises, work on balance, gait, LE/hip strength    PT Home Exercise Plan  Access Code: [facility]    Consulted and Agree with Plan of Care  Patient       Patient will benefit from skilled therapeutic intervention in order to improve the following deficits and impairments:  Abnormal gait, Decreased activity tolerance, Decreased strength, Impaired flexibility, Difficulty walking, Decreased mobility, Decreased endurance, Improper body mechanics, Postural dysfunction, Increased muscle spasms  Visit  Diagnosis: BPPV (benign paroxysmal positional vertigo), right     Problem List Patient Active Problem List   Diagnosis Date Noted  . Gait abnormality 10/14/2019  . Mixed hyperlipidemia 07/31/2019  . Vitamin D deficiency disease 05/01/2019  . Chronic renal disease, stage 3, moderately decreased glomerular filtration rate (GFR) between 30-59 mL/min/1.73 square meter 05/01/2019  . History of adenomatous polyp of colon 07/17/2018  . Bladder prolapse, female, acquired 09/21/2017  . IFG (impaired fasting glucose) 10/21/2016  . Gastro-esophageal reflux disease without esophagitis 10/20/2016  . Idiopathic peripheral neuropathy 04/08/2016  . Osteoarthritis of spine with radiculopathy, lumbar region 11/24/2015  . Osteopenia 05/28/2014  . Essential hypertension 03/05/2014  . Irritable bowel syndrome (IBS) 03/27/2013  . Former smoker 03/06/2013  . Seborrheic dermatitis of scalp 03/06/2013  . Allergic rhinitis 01/03/2012  . Symptomatic PVCs 01/03/2012    DildayJenness Corner, PT 12/11/2019, 12:20 PM  [place]  [place] [address] [place], [place], [address] Phone: [phone]   Fax:  [phone]  Name: BRIYA GOTTESMAN MRN: [id number] Date of Birth: 05-15-1946

## 2019-12-12 ENCOUNTER — Telehealth: Payer: Self-pay | Admitting: Family Medicine

## 2019-12-12 ENCOUNTER — Ambulatory Visit: Payer: Medicare Other

## 2019-12-12 ENCOUNTER — Ambulatory Visit: Payer: Medicare Other | Admitting: Physical Therapy

## 2019-12-12 ENCOUNTER — Other Ambulatory Visit: Payer: Self-pay

## 2019-12-12 DIAGNOSIS — G8929 Other chronic pain: Secondary | ICD-10-CM

## 2019-12-12 DIAGNOSIS — K219 Gastro-esophageal reflux disease without esophagitis: Secondary | ICD-10-CM

## 2019-12-12 DIAGNOSIS — R2689 Other abnormalities of gait and mobility: Secondary | ICD-10-CM | POA: Diagnosis not present

## 2019-12-12 DIAGNOSIS — M6281 Muscle weakness (generalized): Secondary | ICD-10-CM

## 2019-12-12 DIAGNOSIS — R293 Abnormal posture: Secondary | ICD-10-CM | POA: Diagnosis not present

## 2019-12-12 DIAGNOSIS — M545 Low back pain: Secondary | ICD-10-CM | POA: Diagnosis not present

## 2019-12-12 MED ORDER — FAMOTIDINE 40 MG PO TABS: 40.0000 mg | ORAL_TABLET | Freq: Every day | ORAL | 1 refills | Status: DC | PRN

## 2019-12-12 NOTE — Telephone Encounter (Signed)
See note below

## 2019-12-12 NOTE — Addendum Note (Signed)
Addended by: Billey Chang on: 12/12/2019 11:20 AM   Modules accepted: Orders

## 2019-12-12 NOTE — Patient Instructions (Signed)
Access Code: [facility]  URL: [email or web address]  Date: 12/12/2019  Prepared by: Sigurd Sos   Seated Hamstring Stretch - 3 reps - 20 hold - 3x daily - 7x weekly Seated Piriformis Stretch with Trunk [place] - 3 reps - 20 hold - 3x daily - 7x weekly

## 2019-12-12 NOTE — Telephone Encounter (Signed)
Patient called in and stated that her old doctor prescribed this medication  famotidine (PEPCID) 40 MG tablet she doesn't take it all the time and wanted to see if Dr. Jonni Sanger would send it in for her. She would like it sent to the  [id number] [place], [place] Phone:  [phone]  Fax:  [phone]

## 2019-12-12 NOTE — Therapy (Signed)
[facility] [address] [place], [place], [address] Phone: [phone]   Fax:  [phone]  Physical Therapy Treatment  Patient Details  Name: Carrie [name] MRN: [id number] Date of Birth: [birth year] Referring Provider (Carrie [name]): Jovita Gamma, MD   Encounter Date: 12/12/2019  Carrie [name] End of Session - 12/12/19 1143    Visit Number  4   2 low back, 2 BPPV   Date for Carrie [name] Re-Evaluation  02/04/20    Authorization Type  medicare    Carrie [name] Start Time  1103    Carrie [name] Stop Time  1143    Carrie [name] Time Calculation (min)  40 min    Activity Tolerance  Patient tolerated treatment well    Behavior During Therapy  [facility] for tasks assessed/performed       Past Medical History:  Diagnosis Date  . BPPV (benign paroxysmal positional vertigo), bilateral 04/11/2019  . Colon polyps   . Esophageal spasm   . Fatigue   . Frequent PVCs   . GERD (gastroesophageal reflux disease)   . IBS (irritable bowel syndrome)   . Palpitations   . Recurrent UTI   . Seborrheic dermatitis of scalp 03/06/13  . Vitamin B 12 deficiency     Past Surgical History:  Procedure Laterality Date  . CHOLECYSTECTOMY N/A 08/23/2016   Procedure: LAPAROSCOPIC CHOLECYSTECTOMY;  Surgeon: Rolm Bookbinder, MD;  Location: [facility];  Service: General;  Laterality: N/A;  . COLECTOMY Right 1998   large colonic polyp  . ENDOSCOPIC VEIN LASER TREATMENT Bilateral 2010-2011  . KNEE ARTHROSCOPY W/ MENISCECTOMY Left   . [place]  2001  . SPINE SURGERY     3 since 11/2010, laminectomy, May 2012 lumbar fusion, diskectomy    There were no vitals filed for this visit.  Subjective Assessment - 12/12/19 1102    Subjective  I did 25 minutes on the NuStep at [facility] this morning.  I had been doing my exercises that you gave me.  I also started doing Body Groove with my friend yesterday.    Pertinent History  lumbar surgeries: 11/2010 and 01/2011 discectomy surgeries.  L4-5 lumbar fusion 2012.   Lt knee surgery: 12/18/2018    Currently in Pain?  Yes    Pain Score  2     Pain Location  Back    Pain Orientation  Lower    Pain Descriptors / Indicators  Sore;Tightness    Pain Type  Chronic pain    Pain Onset  More than a month ago    Pain Frequency  Constant                       [facility] Adult Carrie [name] Treatment/Exercise - 12/12/19 0001      Exercises   Exercises  Lumbar;Knee/Hip      Lumbar Exercises: Stretches   Active Hamstring Stretch  Left;Right;3 reps;20 seconds    Figure 4 Stretch  3 reps;20 seconds    Figure 4 Stretch Limitations  seated      Knee/Hip Exercises: Standing   Rocker Board  3 minutes    Other Standing Knee Exercises  weight shifting on black pad x3 minutes       Knee/Hip Exercises: Seated   Long Arc Quad  Strengthening;Both;2 sets;10 reps    Hamstring Curl  Strengthening;Both;2 sets;10 reps    Hamstring Limitations  green band    Abduction/Adduction   Strengthening;Both;2 sets;20 reps    Abd/Adduction Limitations  ball squeeze, green band  abduction    Sit to [employer]  2 sets;10 reps             Carrie [name] Education - 12/12/19 1117    Education Details  Access Code: [facility]    Person(s) Educated  Patient    Methods  Explanation;Demonstration;Handout    Comprehension  Verbalized understanding;Returned demonstration       Carrie [name] Short Term Goals - 12/10/19 1231      Carrie [name] SHORT TERM GOAL #1   Title  be independent in initial HEP    Time  4    Period  Weeks    Status  New    Target Date  01/07/20      Carrie [name] SHORT TERM GOAL #2   Title  perform 5x sit to stand in < or = to 11.5 seconds to reduce falls risk    Time  4    Period  Weeks    Status  New    Target Date  01/07/20      Carrie [name] SHORT TERM GOAL #3   Title  demonstrate heel strike bilaterally with gait on level surface > or = to 50% of the time to reduce falls risk    Time  4    Period  Weeks    Status  New    Target Date  01/07/20      Carrie [name] SHORT TERM GOAL #4   Title  improve endurance  to walk for 10-15 minutes without significant fatigue to improve community endurance    Time  4    Period  Weeks    Status  New    Target Date  01/07/20        Carrie [name] Long Term Goals - 12/10/19 1315      Carrie [name] LONG TERM GOAL #1   Title  be independent in advanced HEP    Time  8    Period  Weeks    Status  New    Target Date  02/04/20      Carrie [name] LONG TERM GOAL #2   Title  demonstrate t to 4+/5 bil hip and knee strength to improve endurance for community activity    Time  8    Period  Weeks    Status  New    Target Date  02/04/20      Carrie [name] LONG TERM GOAL #3   Title  reduce FOTO to < or = to 47% limitation    Time  8    Period  Weeks    Status  New    Target Date  02/04/20      Carrie [name] LONG TERM GOAL #4   Title  perform regular walking for exercise and verbalize understanding of safe progression    Time  8    Period  Weeks    Status  New    Target Date  02/04/20      Carrie [name] LONG TERM GOAL #5   Title  improve LE strength and endurance to walk for 20-25 minutes without limitation    Time  8    Period  Weeks    Status  New    Target Date  02/04/20      Additional Long Term Goals   Additional Long Term Goals  Yes      Carrie [name] LONG TERM GOAL #6   Title  report a 50% reduction in LBP with standing and walking to improve safety and independepence in the community    Time  8  Period  Weeks    Status  New    Target Date  02/04/20            Plan - 12/12/19 1119    Clinical Impression Statement  Carrie [name] with first time follow-up after evaluation.  Carrie [name] has been performing HEP consistently.  Carrie [name] did the NuStep prior to coming to treatment today so this was not done today.  Carrie [name] has been very active with exercise and Carrie [name] advised her to pace herself.  Carrie [name] agreed to do this.  Carrie [name] performed all HEP correctly today.  Carrie [name] demonstrated muscle fatigue with exercise today and was monitored throughout the session by Carrie [name].  Carrie [name] will continue to benefit from skilled Carrie [name] for strength, endurance and gait to improve safety  at home and in the community.    Carrie [name] Frequency  2x / week    Carrie [name] Duration  8 weeks    Carrie [name] Treatment/Interventions  Patient/family education;ADLs/Self Care Home Management;Moist Heat;Electrical Stimulation;Stair training;Gait training;Functional mobility training;Neuromuscular re-education;Balance training;Therapeutic exercise;Therapeutic activities;Manual techniques;Taping;Dry needling;Passive range of motion    Carrie [name] Next Visit Plan  advise on pool exercises, work on balance, gait, LE/hip strength    Carrie [name] Home Exercise Plan  Access Code: [facility]    Consulted and Agree with Plan of Care  Patient       Patient will benefit from skilled therapeutic intervention in order to improve the following deficits and impairments:  Abnormal gait, Decreased activity tolerance, Decreased strength, Impaired flexibility, Difficulty walking, Decreased mobility, Decreased endurance, Improper body mechanics, Postural dysfunction, Increased muscle spasms  Visit Diagnosis: Chronic bilateral low back pain without sciatica  Muscle weakness (generalized)  Abnormal posture  Other abnormalities of gait and mobility     Problem List Patient Active Problem List   Diagnosis Date Noted  . Gait abnormality 10/14/2019  . Mixed hyperlipidemia 07/31/2019  . Vitamin D deficiency disease 05/01/2019  . Chronic renal disease, stage 3, moderately decreased glomerular filtration rate (GFR) between 30-59 mL/min/1.73 square meter 05/01/2019  . History of adenomatous polyp of colon 07/17/2018  . Bladder prolapse, female, acquired 09/21/2017  . IFG (impaired fasting glucose) 10/21/2016  . Gastro-esophageal reflux disease without esophagitis 10/20/2016  . Idiopathic peripheral neuropathy 04/08/2016  . Osteoarthritis of spine with radiculopathy, lumbar region 11/24/2015  . Osteopenia 05/28/2014  . Essential hypertension 03/05/2014  . Irritable bowel syndrome (IBS) 03/27/2013  . Former smoker 03/06/2013  . Seborrheic dermatitis  of scalp 03/06/2013  . Allergic rhinitis 01/03/2012  . Symptomatic PVCs 01/03/2012   Carrie [name], Carrie [name] 12/12/19 11:44 AM   [facility] [address] [place], [place], [address] Phone: [phone]   Fax:  [phone]  Name: Carrie [name] MRN: [id number] Date of Birth: 1946-06-16

## 2019-12-12 NOTE — Telephone Encounter (Signed)
Refilled and sent mychart message

## 2019-12-16 ENCOUNTER — Other Ambulatory Visit: Payer: Self-pay

## 2019-12-16 ENCOUNTER — Encounter: Payer: Self-pay | Admitting: Physical Therapy

## 2019-12-16 ENCOUNTER — Ambulatory Visit: Payer: Medicare Other | Admitting: Physical Therapy

## 2019-12-16 DIAGNOSIS — G8929 Other chronic pain: Secondary | ICD-10-CM

## 2019-12-16 DIAGNOSIS — M6281 Muscle weakness (generalized): Secondary | ICD-10-CM

## 2019-12-16 DIAGNOSIS — R293 Abnormal posture: Secondary | ICD-10-CM

## 2019-12-16 DIAGNOSIS — R2689 Other abnormalities of gait and mobility: Secondary | ICD-10-CM

## 2019-12-16 DIAGNOSIS — M545 Low back pain: Secondary | ICD-10-CM | POA: Diagnosis not present

## 2019-12-16 NOTE — Therapy (Signed)
[facility] [address], [place], [place], [address] Phone: [phone]   Fax:  [phone]  Physical Therapy Treatment  Patient Details  Name: Carrie [name] MRN: [id number] Date of Birth: 12-Jun-1946 Referring Provider (PT): Jovita Gamma, MD   Encounter Date: 12/16/2019  PT End of Session - 12/16/19 1536    Visit Number  5    Date for PT Re-Evaluation  02/04/20    Authorization Type  medicare    Authorization Time Period  11-05-19 -02-02-20    PT Start Time  1528    PT Stop Time  1620    PT Time Calculation (min)  52 min    Activity Tolerance  Patient tolerated treatment well    Behavior During Therapy  [facility] for tasks assessed/performed       Past Medical History:  Diagnosis Date  . BPPV (benign paroxysmal positional vertigo), bilateral 04/11/2019  . Colon polyps   . Esophageal spasm   . Fatigue   . Frequent PVCs   . GERD (gastroesophageal reflux disease)   . IBS (irritable bowel syndrome)   . Palpitations   . Recurrent UTI   . Seborrheic dermatitis of scalp 03/06/13  . Vitamin B 12 deficiency     Past Surgical History:  Procedure Laterality Date  . CHOLECYSTECTOMY N/A 08/23/2016   Procedure: LAPAROSCOPIC CHOLECYSTECTOMY;  Surgeon: Rolm Bookbinder, MD;  Location: [facility];  Service: General;  Laterality: N/A;  . COLECTOMY Right 1998   large colonic polyp  . ENDOSCOPIC VEIN LASER TREATMENT Bilateral 2010-2011  . KNEE ARTHROSCOPY W/ MENISCECTOMY Left   . [place]  2001  . SPINE SURGERY     3 since 11/2010, laminectomy, May 2012 lumbar fusion, diskectomy    There were no vitals filed for this visit.  Subjective Assessment - 12/16/19 1601    Subjective  My hip realy hurts after the chair stretch for my hip. I do 45 of walking in the pool.    Pertinent History  lumbar surgeries: 11/2010 and 01/2011 discectomy surgeries.  L4-5 lumbar fusion 2012.  Lt knee surgery: 12/18/2018    Currently in Pain?   Yes    Pain Score  5     Pain Location  Back    Pain Orientation  Lower    Pain Descriptors / Indicators  Sore    Aggravating Factors   In the morning    Pain Relieving Factors  meds, heat                       [facility] Adult PT Treatment/Exercise - 12/16/19 0001      Lumbar Exercises: Seated   Other Seated Lumbar Exercises  Cat Camel 10x      Knee/Hip Exercises: Stretches   Other Knee/Hip Stretches  Modified her hip ER stretching in supine with pillows for support secondary to pain. Pt could strtech for 2 min on each side with pillows painfree. Pt will do this at home in place of seated piriformis until she can do that better.      Other Knee/Hip Stretches  seated cat camel 10x for segmental mobility. Added to HEP      Knee/Hip Exercises: Seated   Long Arc Quad  Both;2 sets;10 reps      Ankle Exercises: Seated   Other Seated Ankle Exercises  Rocker board x 5 min seated to discuss status and water exercises  PT Education - 12/16/19 1623    Education Details  Seated Cat Camel for HEP and aquatic exs inc; walking backwards and sideways, heel raises and hip abduciton.    Person(s) Educated  Patient    Methods  Explanation;Demonstration;Handout    Comprehension  Returned demonstration;Verbalized understanding       PT Short Term Goals - 12/10/19 1231      PT SHORT TERM GOAL #1   Title  be independent in initial HEP    Time  4    Period  Weeks    Status  New    Target Date  01/07/20      PT SHORT TERM GOAL #2   Title  perform 5x sit to stand in < or = to 11.5 seconds to reduce falls risk    Time  4    Period  Weeks    Status  New    Target Date  01/07/20      PT SHORT TERM GOAL #3   Title  demonstrate heel strike bilaterally with gait on level surface > or = to 50% of the time to reduce falls risk    Time  4    Period  Weeks    Status  New    Target Date  01/07/20      PT SHORT TERM GOAL #4   Title  improve endurance to walk for 10-15  minutes without significant fatigue to improve community endurance    Time  4    Period  Weeks    Status  New    Target Date  01/07/20        PT Long Term Goals - 12/10/19 1315      PT LONG TERM GOAL #1   Title  be independent in advanced HEP    Time  8    Period  Weeks    Status  New    Target Date  02/04/20      PT LONG TERM GOAL #2   Title  demonstrate t to 4+/5 bil hip and knee strength to improve endurance for community activity    Time  8    Period  Weeks    Status  New    Target Date  02/04/20      PT LONG TERM GOAL #3   Title  reduce FOTO to < or = to 47% limitation    Time  8    Period  Weeks    Status  New    Target Date  02/04/20      PT LONG TERM GOAL #4   Title  perform regular walking for exercise and verbalize understanding of safe progression    Time  8    Period  Weeks    Status  New    Target Date  02/04/20      PT LONG TERM GOAL #5   Title  improve LE strength and endurance to walk for 20-25 minutes without limitation    Time  8    Period  Weeks    Status  New    Target Date  02/04/20      Additional Long Term Goals   Additional Long Term Goals  Yes      PT LONG TERM GOAL #6   Title  report a 50% reduction in LBP with standing and walking to improve safety and independepence in the community    Time  8    Period  Weeks  Status  New    Target Date  02/04/20            Plan - 12/16/19 1538    Clinical Impression Statement  Pt arrives today with moderate back pain. She was educated in more pool exercises she can add to her water walking forward. In addition pt reports her morning stiffness is really bad. She was given cat/camel to try to combat the morning stiffness. pt had no increased pain with todays exercises and actually walked better, less stiff, at the end of the session.    Personal Factors and Comorbidities  Comorbidity 2    Comorbidities  3 lumbar surgeries, Lt knee surgery    Examination-Activity Limitations  Caring for  Others;Carry;Squat;Stand;Stairs;Locomotion Level    Examination-Participation Restrictions  Community Activity;Meal Prep;Cleaning;Laundry;Shop    Stability/Clinical Decision Making  Evolving/Moderate complexity    Rehab Potential  Good    PT Frequency  2x / week    PT Duration  8 weeks    PT Treatment/Interventions  Patient/family education;ADLs/Self Care Home Management;Moist Heat;Electrical Stimulation;Stair training;Gait training;Functional mobility training;Neuromuscular re-education;Balance training;Therapeutic exercise;Therapeutic activities;Manual techniques;Taping;Dry needling;Passive range of motion    PT Next Visit Plan  See how pt did trying the new pool exercises, then continue with gait & balance including LE/hip strength.    PT Home Exercise Plan  Access Code: [facility]       Patient will benefit from skilled therapeutic intervention in order to improve the following deficits and impairments:  Abnormal gait, Decreased activity tolerance, Decreased strength, Impaired flexibility, Difficulty walking, Decreased mobility, Decreased endurance, Improper body mechanics, Postural dysfunction, Increased muscle spasms  Visit Diagnosis: Chronic bilateral low back pain without sciatica  Muscle weakness (generalized)  Abnormal posture  Other abnormalities of gait and mobility     Problem List Patient Active Problem List   Diagnosis Date Noted  . Gait abnormality 10/14/2019  . Mixed hyperlipidemia 07/31/2019  . Vitamin D deficiency disease 05/01/2019  . Chronic renal disease, stage 3, moderately decreased glomerular filtration rate (GFR) between 30-59 mL/min/1.73 square meter 05/01/2019  . History of adenomatous polyp of colon 07/17/2018  . Bladder prolapse, female, acquired 09/21/2017  . IFG (impaired fasting glucose) 10/21/2016  . Gastro-esophageal reflux disease without esophagitis 10/20/2016  . Idiopathic peripheral neuropathy 04/08/2016  . Osteoarthritis of spine with  radiculopathy, lumbar region 11/24/2015  . Osteopenia 05/28/2014  . Essential hypertension 03/05/2014  . Irritable bowel syndrome (IBS) 03/27/2013  . Former smoker 03/06/2013  . Seborrheic dermatitis of scalp 03/06/2013  . Allergic rhinitis 01/03/2012  . Symptomatic PVCs 01/03/2012    Marra Fraga, PTA 12/16/2019, 4:33 PM  [facility] [address] [place], [place], [address] Phone: [phone]   Fax:  [phone]  Name: Carrie [name] MRN: [id number] Date of Birth: 20-Oct-1946  Access Code: [facility]  URL: [email or web address]  Date: 12/16/2019  Prepared by: Myrene Galas   Exercises  Seated Long Arc Quad - 10 reps - 2 sets - 5 hold - 3x daily - 7x weekly  Seated March - 10 reps - 3 sets - 3x daily - 7x weekly  Seated Heel Toe Raises - 10 reps - 2 sets - 3x daily - 7x weekly  Seated Isometric Hip Adduction with Ball - 10 reps - 2 sets - 3x daily - 7x weekly  Correct Standing Posture - 10 reps - 3 sets - 1x daily - 7x weekly  Seated Hamstring Stretch - 3 reps - 20 hold - 3x daily -  7x weekly  Seated Piriformis Stretch with Trunk Bend - 3 reps - 20 hold - 3x daily - 7x weekly  Side Stepping - 2 reps - 1 sets - 1x daily - 3x weekly  Backward Walking - 2 reps - 1 sets - 1x daily - 3x weekly  Standing Heel Raises - 10 reps - 1 sets - 1x daily - 3x weekly  Standing Hip Abduction - 10 reps - 1 sets - 1x daily - 3x weekly  Seated Cat Camel - 10 reps - 1 sets - 2x daily - 7x weekly

## 2019-12-19 DIAGNOSIS — Z23 Encounter for immunization: Secondary | ICD-10-CM | POA: Diagnosis not present

## 2019-12-20 ENCOUNTER — Ambulatory Visit: Payer: Medicare Other | Admitting: Physical Therapy

## 2019-12-20 ENCOUNTER — Encounter: Payer: Self-pay | Admitting: Physical Therapy

## 2019-12-20 ENCOUNTER — Other Ambulatory Visit: Payer: Self-pay

## 2019-12-20 DIAGNOSIS — M545 Low back pain: Secondary | ICD-10-CM | POA: Diagnosis not present

## 2019-12-20 DIAGNOSIS — G8929 Other chronic pain: Secondary | ICD-10-CM

## 2019-12-20 DIAGNOSIS — R293 Abnormal posture: Secondary | ICD-10-CM

## 2019-12-20 DIAGNOSIS — R2689 Other abnormalities of gait and mobility: Secondary | ICD-10-CM | POA: Diagnosis not present

## 2019-12-20 DIAGNOSIS — M6281 Muscle weakness (generalized): Secondary | ICD-10-CM

## 2019-12-20 NOTE — Therapy (Signed)
[facility] [address], [place], [place], [address] Phone: [phone]   Fax:  [phone]  Physical Therapy Treatment  Patient Details  Name: Carrie [name] MRN: [id number] Date of Birth: [birth year] Referring Provider (PT): Jovita Gamma, MD   Encounter Date: 12/20/2019  PT End of Session - 12/20/19 0938    Visit Number  6    Date for PT Re-Evaluation  02/04/20    Authorization Type  medicare    Authorization Time Period  11-05-19 -02-02-20    PT Start Time  0932    PT Stop Time  1015    PT Time Calculation (min)  43 min    Activity Tolerance  Patient tolerated treatment well    Behavior During Therapy  [facility] for tasks assessed/performed       Past Medical History:  Diagnosis Date  . BPPV (benign paroxysmal positional vertigo), bilateral 04/11/2019  . Colon polyps   . Esophageal spasm   . Fatigue   . Frequent PVCs   . GERD (gastroesophageal reflux disease)   . IBS (irritable bowel syndrome)   . Palpitations   . Recurrent UTI   . Seborrheic dermatitis of scalp 03/06/13  . Vitamin B 12 deficiency     Past Surgical History:  Procedure Laterality Date  . CHOLECYSTECTOMY N/A 08/23/2016   Procedure: LAPAROSCOPIC CHOLECYSTECTOMY;  Surgeon: Rolm Bookbinder, MD;  Location: [facility];  Service: General;  Laterality: N/A;  . COLECTOMY Right 1998   large colonic polyp  . ENDOSCOPIC VEIN LASER TREATMENT Bilateral 2010-2011  . KNEE ARTHROSCOPY W/ MENISCECTOMY Left   . [place]  2001  . SPINE SURGERY     3 since 11/2010, laminectomy, May 2012 lumbar fusion, diskectomy    There were no vitals filed for this visit.  Subjective Assessment - 12/20/19 0936    Subjective  Bad week: had COVID vaccine Tuesday and felt not so good so for a few days. Then she had had long wait in the car for a dermatologist appt yesterday which made her very stiff.    Pertinent History  lumbar surgeries: 11/2010 and 01/2011 discectomy  surgeries.  L4-5 lumbar fusion 2012.  Lt knee surgery: 12/18/2018    Currently in Pain?  Yes    Pain Score  5     Pain Location  Back    Pain Orientation  Lower;Left    Pain Descriptors / Indicators  Sore    Multiple Pain Sites  No                       [facility] Adult PT Treatment/Exercise - 12/20/19 0001      Ambulation/Gait   Gait Comments  Heel toe walking 40 feet 4x, VC for taller walking      Lumbar Exercises: Aerobic   Other Aerobic Exercise  seated ankle rocks x 3 min      Knee/Hip Exercises: Standing   Forward Step Up  Both;1 set;10 reps;Step Height: 2";Step Height: 6"    Forward Step Up Limitations  Pt felt like her heart was racing and " needed to sit down" after one set on the RTLE. We dsat pt and her O2 sat was 88% and HR was 115. This normalized after few min of sitting      Knee/Hip Exercises: Seated   Long Arc Quad  Strengthening;Both;2 sets;Weights    Long Arc Quad Weight  2 lbs.   added ball squeeze   Marching  Strengthening;Both;2 sets;10 reps;Weights    Marching Limitations  VC to lift ribs up    Marching Weights  2 lbs.    Abduction/Adduction   Strengthening;Both;2 sets;20 reps    Abd/Adduction Limitations  green band for abd               PT Short Term Goals - 12/20/19 1002      PT SHORT TERM GOAL #1   Title  be independent in initial HEP    Time  4    Period  Weeks    Status  Achieved    Target Date  01/07/20        PT Long Term Goals - 12/10/19 1315      PT LONG TERM GOAL #1   Title  be independent in advanced HEP    Time  8    Period  Weeks    Status  New    Target Date  02/04/20      PT LONG TERM GOAL #2   Title  demonstrate t to 4+/5 bil hip and knee strength to improve endurance for community activity    Time  8    Period  Weeks    Status  New    Target Date  02/04/20      PT LONG TERM GOAL #3   Title  reduce FOTO to < or = to 47% limitation    Time  8    Period  Weeks    Status  New    Target Date   02/04/20      PT LONG TERM GOAL #4   Title  perform regular walking for exercise and verbalize understanding of safe progression    Time  8    Period  Weeks    Status  New    Target Date  02/04/20      PT LONG TERM GOAL #5   Title  improve LE strength and endurance to walk for 20-25 minutes without limitation    Time  8    Period  Weeks    Status  New    Target Date  02/04/20      Additional Long Term Goals   Additional Long Term Goals  Yes      PT LONG TERM GOAL #6   Title  report a 50% reduction in LBP with standing and walking to improve safety and independepence in the community    Time  8    Period  Weeks    Status  New    Target Date  02/04/20            Plan - 12/20/19 0938    Clinical Impression Statement  Pt had O2 sat drop after step ups exercise.This normalizes with a few minutes of sititng and concentrating on her breathing. Pt was allowed to take her mask down to do this until her O2 was at least 98%. PTA would step away. Pt was able to demonstrate improved heel toe walking, speed and step length in todays session. Pt reports she " has been really working on this at home." She tolerated light resistance for hip and knee strengthening exercises seated., no pain. Heat to her back during the seated exercises abolished her pain.    Personal Factors and Comorbidities  Comorbidity 2    Comorbidities  3 lumbar surgeries, Lt knee surgery    Examination-Activity Limitations  Caring for Others;Carry;Squat;Stand;Stairs;Locomotion Level    Examination-Participation Restrictions  Community Activity;Meal Prep;Cleaning;Laundry;Shop  Stability/Clinical Decision Making  Evolving/Moderate complexity    Rehab Potential  Good    PT Frequency  2x / week    PT Duration  8 weeks    PT Treatment/Interventions  Patient/family education;ADLs/Self Care Home Management;Moist Heat;Electrical Stimulation;Stair training;Gait training;Functional mobility training;Neuromuscular  re-education;Balance training;Therapeutic exercise;Therapeutic activities;Manual techniques;Taping;Dry needling;Passive range of motion    PT Next Visit Plan  Continue with gait & balance including LE/hip strength.    PT Home Exercise Plan  Access Code: [facility]    Consulted and Agree with Plan of Care  Patient       Patient will benefit from skilled therapeutic intervention in order to improve the following deficits and impairments:  Abnormal gait, Decreased activity tolerance, Decreased strength, Impaired flexibility, Difficulty walking, Decreased mobility, Decreased endurance, Improper body mechanics, Postural dysfunction, Increased muscle spasms  Visit Diagnosis: Chronic bilateral low back pain without sciatica  Muscle weakness (generalized)  Abnormal posture  Other abnormalities of gait and mobility     Problem List Patient Active Problem List   Diagnosis Date Noted  . Gait abnormality 10/14/2019  . Mixed hyperlipidemia 07/31/2019  . Vitamin D deficiency disease 05/01/2019  . Chronic renal disease, stage 3, moderately decreased glomerular filtration rate (GFR) between 30-59 mL/min/1.73 square meter 05/01/2019  . History of adenomatous polyp of colon 07/17/2018  . Bladder prolapse, female, acquired 09/21/2017  . IFG (impaired fasting glucose) 10/21/2016  . Gastro-esophageal reflux disease without esophagitis 10/20/2016  . Idiopathic peripheral neuropathy 04/08/2016  . Osteoarthritis of spine with radiculopathy, lumbar region 11/24/2015  . Osteopenia 05/28/2014  . Essential hypertension 03/05/2014  . Irritable bowel syndrome (IBS) 03/27/2013  . Former smoker 03/06/2013  . Seborrheic dermatitis of scalp 03/06/2013  . Allergic rhinitis 01/03/2012  . Symptomatic PVCs 01/03/2012    Carrie [name], PTA 12/20/2019, 10:18 AM  [facility] [address] [place], [place], [address] Phone: [phone]   Fax:   [phone]  Name: Carrie [name] MRN: [id number] Date of Birth: [birth year]

## 2019-12-23 ENCOUNTER — Ambulatory Visit: Payer: Medicare Other | Admitting: Physical Therapy

## 2019-12-23 ENCOUNTER — Telehealth: Payer: Self-pay | Admitting: Internal Medicine

## 2019-12-23 ENCOUNTER — Encounter: Payer: Self-pay | Admitting: Physical Therapy

## 2019-12-23 ENCOUNTER — Other Ambulatory Visit: Payer: Self-pay

## 2019-12-23 DIAGNOSIS — M545 Low back pain: Secondary | ICD-10-CM | POA: Diagnosis not present

## 2019-12-23 DIAGNOSIS — M6281 Muscle weakness (generalized): Secondary | ICD-10-CM

## 2019-12-23 DIAGNOSIS — R2689 Other abnormalities of gait and mobility: Secondary | ICD-10-CM | POA: Diagnosis not present

## 2019-12-23 DIAGNOSIS — G8929 Other chronic pain: Secondary | ICD-10-CM | POA: Diagnosis not present

## 2019-12-23 DIAGNOSIS — R293 Abnormal posture: Secondary | ICD-10-CM | POA: Diagnosis not present

## 2019-12-23 NOTE — Telephone Encounter (Signed)
I spoke to the patient who recently has been experiencing Tachycardia with SOB and fatigue.  HR has been 110 bpm at rest with BP at 137/91.  She denies CP, but would like to make an appointment with someone.  She saw Renee in August.

## 2019-12-23 NOTE — Telephone Encounter (Signed)
Patient c/o Palpitations:  High priority if patient c/o lightheadedness, shortness of breath, or chest pain  1) How long have you had palpitations/irregular HR/ Afib? Are you having the symptoms now? 3 or 4 days  2) Are you currently experiencing lightheadedness, SOB or CP?  Shortness of breath- a little right now- she feels lightheaded when she is having the episodes  3) Do you have a history of afib (atrial fibrillation) or irregular heart rhythm?  Yes- years ago tachycardia  4) Have you checked your BP or HR? (document readings if available): yes  5) Are you experiencing any other symptoms? No- pt wants to be seen today if possible please

## 2019-12-23 NOTE — Therapy (Signed)
[facility] [address] [place], [place], [address] Phone: [phone]   Fax:  [phone]  Physical Therapy Treatment  Patient Details  Name: Carrie [name] MRN: [id number] Date of Birth: [birth year] Referring Provider (PT): Jovita Gamma, MD   Encounter Date: 12/23/2019  PT End of Session - 12/23/19 1454    Visit Number  7    Date for PT Re-Evaluation  02/04/20    Authorization Type  medicare    Authorization Time Period  11-05-19 -02-02-20    PT Start Time  1445    PT Stop Time  1525    PT Time Calculation (min)  40 min    Activity Tolerance  Patient tolerated treatment well    Behavior During Therapy  [facility] for tasks assessed/performed       Past Medical History:  Diagnosis Date  . BPPV (benign paroxysmal positional vertigo), bilateral 04/11/2019  . Colon polyps   . Esophageal spasm   . Fatigue   . Frequent PVCs   . GERD (gastroesophageal reflux disease)   . IBS (irritable bowel syndrome)   . Palpitations   . Recurrent UTI   . Seborrheic dermatitis of scalp 03/06/13  . Vitamin B 12 deficiency     Past Surgical History:  Procedure Laterality Date  . CHOLECYSTECTOMY N/A 08/23/2016   Procedure: LAPAROSCOPIC CHOLECYSTECTOMY;  Surgeon: Rolm Bookbinder, MD;  Location: [facility];  Service: General;  Laterality: N/A;  . COLECTOMY Right 1998   large colonic polyp  . ENDOSCOPIC VEIN LASER TREATMENT Bilateral 2010-2011  . KNEE ARTHROSCOPY W/ MENISCECTOMY Left   . [place]  2001  . SPINE SURGERY     3 since 11/2010, laminectomy, May 2012 lumbar fusion, diskectomy    There were no vitals filed for this visit.  Subjective Assessment - 12/23/19 1456    Subjective  My back has been ok but my feeling of being out of breath, like I am going to pass out  has continued and been more frequent. i did pool walk Saturday and that was fine.    Pertinent History  lumbar surgeries: 11/2010 and 01/2011 discectomy  surgeries.  L4-5 lumbar fusion 2012.  Lt knee surgery: 12/18/2018    Currently in Pain?  Yes    Pain Score  2     Pain Location  Back    Pain Descriptors / Indicators  Dull;Aching    Multiple Pain Sites  No                       [facility] Adult PT Treatment/Exercise - 12/23/19 0001      Lumbar Exercises: Stretches   Single Knee to Chest Stretch  Right;Left;2 reps;10 seconds    Lower Trunk Rotation  2 reps;10 seconds    Other Lumbar Stretch Exercise  leg lengthener stretch bil 3 sec hold 5x       Lumbar Exercises: Supine   Glut Set  --   Ball squeeze, TA, Glute cocontractions 6x2 3 sec hold   Straight Leg Raise  5 reps   2 sets: VC > knee extension   Other Supine Lumbar Exercises  leg press/isometric hip extension bil 2x5 3 sec hold      Knee/Hip Exercises: Seated   Long Arc Quad  Strengthening;Both;2 sets;10 reps;Weights    Long Arc Quad Weight  --   2.5   Marching  Strengthening;Both;2 sets;10 reps;Weights    Marching Limitations  VC to lift ribs up  Marching Weights  3 lbs.    Abduction/Adduction   Strengthening;Both;2 sets;20 reps    Abd/Adduction Limitations  green band for abd      Moist Heat Therapy   Number Minutes Moist Heat  --   For supine exercises              PT Short Term Goals - 12/20/19 1002      PT SHORT TERM GOAL #1   Title  be independent in initial HEP    Time  4    Period  Weeks    Status  Achieved    Target Date  01/07/20        PT Long Term Goals - 12/10/19 1315      PT LONG TERM GOAL #1   Title  be independent in advanced HEP    Time  8    Period  Weeks    Status  New    Target Date  02/04/20      PT LONG TERM GOAL #2   Title  demonstrate t to 4+/5 bil hip and knee strength to improve endurance for community activity    Time  8    Period  Weeks    Status  New    Target Date  02/04/20      PT LONG TERM GOAL #3   Title  reduce FOTO to < or = to 47% limitation    Time  8    Period  Weeks    Status  New     Target Date  02/04/20      PT LONG TERM GOAL #4   Title  perform regular walking for exercise and verbalize understanding of safe progression    Time  8    Period  Weeks    Status  New    Target Date  02/04/20      PT LONG TERM GOAL #5   Title  improve LE strength and endurance to walk for 20-25 minutes without limitation    Time  8    Period  Weeks    Status  New    Target Date  02/04/20      Additional Long Term Goals   Additional Long Term Goals  Yes      PT LONG TERM GOAL #6   Title  report a 50% reduction in LBP with standing and walking to improve safety and independepence in the community    Time  8    Period  Weeks    Status  New    Target Date  02/04/20            Plan - 12/23/19 1502    Clinical Impression Statement  Pt continues to struggle with a feeling of "weakness" that makes her feel like she needs to sit down. When we check her O2 in the clinic it is typically in the low 90's but quickly gets to 94% then with some concentrated breathing it eventually gets to 97% sat. Today we focused more on supine flexibility and gentle core stabilization as seated exercises were dropping O2 and increasing HR to > 100 ( 106) BPMS, making pt feel nervous. Pt did better in supine. "My back feels good" after the stretching.    Personal Factors and Comorbidities  Comorbidity 2    Comorbidities  3 lumbar surgeries, Lt knee surgery    Examination-Activity Limitations  Caring for Others;Carry;Squat;Stand;Stairs;Locomotion Level    Examination-Participation Restrictions  Community Activity;Meal Prep;Cleaning;Laundry;Shop  Stability/Clinical Decision Making  Evolving/Moderate complexity    Rehab Potential  Good    PT Frequency  2x / week    PT Duration  8 weeks    PT Treatment/Interventions  Patient/family education;ADLs/Self Care Home Management;Moist Heat;Electrical Stimulation;Stair training;Gait training;Functional mobility training;Neuromuscular re-education;Balance  training;Therapeutic exercise;Therapeutic activities;Manual techniques;Taping;Dry needling;Passive range of motion    PT Next Visit Plan  Pt to see her cardiologist Thursday of this wek. Monitor O2 in the meantime and gently work on lumbar and hip flexibility and gentle core strengh.    PT Home Exercise Plan  Access Code: [facility]    Consulted and Agree with Plan of Care  Patient       Patient will benefit from skilled therapeutic intervention in order to improve the following deficits and impairments:  Abnormal gait, Decreased activity tolerance, Decreased strength, Impaired flexibility, Difficulty walking, Decreased mobility, Decreased endurance, Improper body mechanics, Postural dysfunction, Increased muscle spasms  Visit Diagnosis: Chronic bilateral low back pain without sciatica  Muscle weakness (generalized)  Abnormal posture  Other abnormalities of gait and mobility     Problem List Patient Active Problem List   Diagnosis Date Noted  . Gait abnormality 10/14/2019  . Mixed hyperlipidemia 07/31/2019  . Vitamin D deficiency disease 05/01/2019  . Chronic renal disease, stage 3, moderately decreased glomerular filtration rate (GFR) between 30-59 mL/min/1.73 square meter 05/01/2019  . History of adenomatous polyp of colon 07/17/2018  . Bladder prolapse, female, acquired 09/21/2017  . IFG (impaired fasting glucose) 10/21/2016  . Gastro-esophageal reflux disease without esophagitis 10/20/2016  . Idiopathic peripheral neuropathy 04/08/2016  . Osteoarthritis of spine with radiculopathy, lumbar region 11/24/2015  . Osteopenia 05/28/2014  . Essential hypertension 03/05/2014  . Irritable bowel syndrome (IBS) 03/27/2013  . Former smoker 03/06/2013  . Seborrheic dermatitis of scalp 03/06/2013  . Allergic rhinitis 01/03/2012  . Symptomatic PVCs 01/03/2012    Despina Boan, PTA 12/23/2019, 3:29 PM  [facility] [address], [place], [place], [address] Phone: [phone]   Fax:  [phone]  Name: JUREA CECCARELLI MRN: [id number] Date of Birth: 1946/06/18

## 2019-12-25 ENCOUNTER — Encounter: Payer: Self-pay | Admitting: Physical Therapy

## 2019-12-25 ENCOUNTER — Other Ambulatory Visit: Payer: Self-pay

## 2019-12-25 ENCOUNTER — Ambulatory Visit: Payer: Medicare Other | Admitting: Physical Therapy

## 2019-12-25 DIAGNOSIS — G8929 Other chronic pain: Secondary | ICD-10-CM | POA: Diagnosis not present

## 2019-12-25 DIAGNOSIS — M6281 Muscle weakness (generalized): Secondary | ICD-10-CM | POA: Diagnosis not present

## 2019-12-25 DIAGNOSIS — R2689 Other abnormalities of gait and mobility: Secondary | ICD-10-CM

## 2019-12-25 DIAGNOSIS — M545 Low back pain: Secondary | ICD-10-CM | POA: Diagnosis not present

## 2019-12-25 DIAGNOSIS — R293 Abnormal posture: Secondary | ICD-10-CM

## 2019-12-25 NOTE — Therapy (Signed)
[facility] [address], [place], [place], [address] Phone: [phone]   Fax:  [phone]  Physical Therapy Treatment  Patient Details  Name: Carrie [name] MRN: [id number] Date of Birth: [birth year] Referring Provider (PT): Jovita Gamma, MD   Encounter Date: 12/25/2019  PT End of Session - 12/25/19 1453    Visit Number  8    Date for PT Re-Evaluation  02/04/20    Authorization Type  medicare    Authorization Time Period  11-05-19 -02-02-20    PT Start Time  1447    PT Stop Time  1523    PT Time Calculation (min)  36 min    Activity Tolerance  Patient tolerated treatment well    Behavior During Therapy  [facility] for tasks assessed/performed       Past Medical History:  Diagnosis Date  . BPPV (benign paroxysmal positional vertigo), bilateral 04/11/2019  . Colon polyps   . Esophageal spasm   . Fatigue   . Frequent PVCs   . GERD (gastroesophageal reflux disease)   . IBS (irritable bowel syndrome)   . Palpitations   . Recurrent UTI   . Seborrheic dermatitis of scalp 03/06/13  . Vitamin B 12 deficiency     Past Surgical History:  Procedure Laterality Date  . CHOLECYSTECTOMY N/A 08/23/2016   Procedure: LAPAROSCOPIC CHOLECYSTECTOMY;  Surgeon: Rolm Bookbinder, MD;  Location: [facility];  Service: General;  Laterality: N/A;  . COLECTOMY Right 1998   large colonic polyp  . ENDOSCOPIC VEIN LASER TREATMENT Bilateral 2010-2011  . KNEE ARTHROSCOPY W/ MENISCECTOMY Left   . [place]  2001  . SPINE SURGERY     3 since 11/2010, laminectomy, May 2012 lumbar fusion, diskectomy    There were no vitals filed for this visit.  Subjective Assessment - 12/25/19 1454    Subjective  Stretches felt good after last time. No increase in backpain, feeling of "weakness/HR elevation " ongoing, sees cardiologist tomorrow.    Pertinent History  lumbar surgeries: 11/2010 and 01/2011 discectomy surgeries.  L4-5 lumbar fusion 2012.  Lt  knee surgery: 12/18/2018    Currently in Pain?  Yes    Pain Score  3     Pain Location  Back    Pain Orientation  Lower    Pain Descriptors / Indicators  Dull;Aching    Aggravating Factors   In the morning    Pain Relieving Factors  stretching, heat, gradual movement like walking    Multiple Pain Sites  No                       [facility] Adult PT Treatment/Exercise - 12/25/19 0001      Lumbar Exercises: Stretches   Single Knee to Chest Stretch  Right;Left;2 reps;10 seconds    Lower Trunk Rotation  2 reps;10 seconds    Piriformis Stretch  Right;Left;2 reps;20 seconds    Other Lumbar Stretch Exercise  leg lengthener stretch bil 3 sec hold 5x     Other Lumbar Stretch Exercise  Ext rotation hip release bil 2x 30 sec       Lumbar Exercises: Supine   Glut Set  --   Ball squeeze, TA, Glute cocontractions 8 x2 3 sec hold   Straight Leg Raise  10 reps   2 sets: VC > knee extension   Other Supine Lumbar Exercises  leg press/isometric hip extension bil 2x5 3 sec hold  PT Short Term Goals - 12/20/19 1002      PT SHORT TERM GOAL #1   Title  be independent in initial HEP    Time  4    Period  Weeks    Status  Achieved    Target Date  01/07/20        PT Long Term Goals - 12/10/19 1315      PT LONG TERM GOAL #1   Title  be independent in advanced HEP    Time  8    Period  Weeks    Status  New    Target Date  02/04/20      PT LONG TERM GOAL #2   Title  demonstrate t to 4+/5 bil hip and knee strength to improve endurance for community activity    Time  8    Period  Weeks    Status  New    Target Date  02/04/20      PT LONG TERM GOAL #3   Title  reduce FOTO to < or = to 47% limitation    Time  8    Period  Weeks    Status  New    Target Date  02/04/20      PT LONG TERM GOAL #4   Title  perform regular walking for exercise and verbalize understanding of safe progression    Time  8    Period  Weeks    Status  New    Target Date  02/04/20       PT LONG TERM GOAL #5   Title  improve LE strength and endurance to walk for 20-25 minutes without limitation    Time  8    Period  Weeks    Status  New    Target Date  02/04/20      Additional Long Term Goals   Additional Long Term Goals  Yes      PT LONG TERM GOAL #6   Title  report a 50% reduction in LBP with standing and walking to improve safety and independepence in the community    Time  8    Period  Weeks    Status  New    Target Date  02/04/20            Plan - 12/25/19 1453    Clinical Impression Statement  Pt arrives presenting much like on Monday; walking improved, pain reduced in afternnons vs the morning. Her complaints of a "weakness" in her HR or breath ( hard for her to tell) are ongoing. Supine exercises do not increase any LBP. We will see what cardiologist says before we progress back to standing exercises.    Personal Factors and Comorbidities  Comorbidity 2    Comorbidities  3 lumbar surgeries, Lt knee surgery    Examination-Activity Limitations  Caring for Others;Carry;Squat;Stand;Stairs;Locomotion Level    Examination-Participation Restrictions  Community Activity;Meal Prep;Cleaning;Laundry;Shop    Stability/Clinical Decision Making  Evolving/Moderate complexity    Rehab Potential  Good    PT Frequency  2x / week    PT Duration  8 weeks    PT Treatment/Interventions  Patient/family education;ADLs/Self Care Home Management;Moist Heat;Electrical Stimulation;Stair training;Gait training;Functional mobility training;Neuromuscular re-education;Balance training;Therapeutic exercise;Therapeutic activities;Manual techniques;Taping;Dry needling;Passive range of motion    PT Next Visit Plan  Pt to see her cardiologist Thursday of this wek. Monitor O2 in the meantime and gently work on lumbar and hip flexibility and gentle core strengh.    PT  Home Exercise Plan  Access Code: [facility]    Consulted and Agree with Plan of Care  Patient       Patient will  benefit from skilled therapeutic intervention in order to improve the following deficits and impairments:  Abnormal gait, Decreased activity tolerance, Decreased strength, Impaired flexibility, Difficulty walking, Decreased mobility, Decreased endurance, Improper body mechanics, Postural dysfunction, Increased muscle spasms  Visit Diagnosis: Chronic bilateral low back pain without sciatica  Muscle weakness (generalized)  Abnormal posture  Other abnormalities of gait and mobility     Problem List Patient Active Problem List   Diagnosis Date Noted  . Gait abnormality 10/14/2019  . Mixed hyperlipidemia 07/31/2019  . Vitamin D deficiency disease 05/01/2019  . Chronic renal disease, stage 3, moderately decreased glomerular filtration rate (GFR) between 30-59 mL/min/1.73 square meter 05/01/2019  . History of adenomatous polyp of colon 07/17/2018  . Bladder prolapse, female, acquired 09/21/2017  . IFG (impaired fasting glucose) 10/21/2016  . Gastro-esophageal reflux disease without esophagitis 10/20/2016  . Idiopathic peripheral neuropathy 04/08/2016  . Osteoarthritis of spine with radiculopathy, lumbar region 11/24/2015  . Osteopenia 05/28/2014  . Essential hypertension 03/05/2014  . Irritable bowel syndrome (IBS) 03/27/2013  . Former smoker 03/06/2013  . Seborrheic dermatitis of scalp 03/06/2013  . Allergic rhinitis 01/03/2012  . Symptomatic PVCs 01/03/2012    Jenson Beedle, PTA 12/25/2019, 3:19 PM  [facility] [address] [place], [place], [address] Phone: [phone]   Fax:  [phone]  Name: ARGIRO BOUNDY MRN: [id number] Date of Birth: 15-May-1946

## 2019-12-25 NOTE — Progress Notes (Addendum)
Electrophysiology Office Note Date: 12/26/2019  ID:  Carrie [name], DOB 1946-09-26, MRN [id number]  PCP: Leamon Arnt, MD Electrophysiologist: Rayann Heman  CC: palpitations follow up  Carrie [name] is a 74 y.o. female seen today for Dr Rayann Heman.  She presents today for add on electrophysiology followup. For the last 10-14 days, she has had increased problems with palpitations with exertion.  This corresponds to receiving COVID-19 vaccine first dose.  She has been deconditioned for the last year after knee surgery and then with back issues. Her palpitations are abrupt in onset and gradual in offset. They are associated with slight shortness of breath.  She has been working with PT and has had some hypoxia with exercising (O2 sats high 80's).  Her heart rates with palpitations are in the 120's.  She has been doing pool walking and does not have symptoms while in the pool.  She also has venous insufficiency and has been wearing thigh high compression hose (cannot get on panty hose). She denies chest pain, PND, orthopnea, nausea, vomiting, dizziness, syncope, weight gain, or early satiety.  Past Medical History:  Diagnosis Date  . BPPV (benign paroxysmal positional vertigo), bilateral 04/11/2019  . Colon polyps   . Esophageal spasm   . Fatigue   . Frequent PVCs   . GERD (gastroesophageal reflux disease)   . IBS (irritable bowel syndrome)   . Palpitations   . Recurrent UTI   . Seborrheic dermatitis of scalp 03/06/13  . Vitamin B 12 deficiency    Past Surgical History:  Procedure Laterality Date  . CHOLECYSTECTOMY N/A 08/23/2016   Procedure: LAPAROSCOPIC CHOLECYSTECTOMY;  Surgeon: Rolm Bookbinder, MD;  Location: [facility];  Service: General;  Laterality: N/A;  . COLECTOMY Right 1998   large colonic polyp  . ENDOSCOPIC VEIN LASER TREATMENT Bilateral 2010-2011  . KNEE ARTHROSCOPY W/ MENISCECTOMY Left   . [place]  2001  . SPINE SURGERY     3 since 11/2010, laminectomy, May 2012  lumbar fusion, diskectomy    Current Outpatient Medications  Medication Sig Dispense Refill  . atorvastatin (LIPITOR) 10 MG tablet Take 0.5 tablets (5 mg total) by mouth daily at 6 PM. 90 tablet 1  . Atorvastatin Calcium (LIPITOR PO) Take 5 mg by mouth daily.    Marland Kitchen azelastine (ASTELIN) 0.1 % nasal spray Place 2 sprays into both nostrils 2 (two) times daily. 30 mL 12  . betamethasone dipropionate [id number] % lotion 1 application topical daily    . Biotin 1000 MCG tablet Take 1,000 mcg by mouth daily.    . calcium carbonate (TUMS CALCIUM FOR LIFE BONE) 750 MG chewable tablet Chew 1 tablet by mouth daily.     . Cholecalciferol (VITAMIN D3) 2000 units TABS Take 1 tablet by mouth daily.    . Ciclopirox 1 % shampoo Apply 1 application topically daily as needed (itching).     . disopyramide (NORPACE CR) 150 MG 12 hr capsule Take 1 capsule (150 mg total) by mouth daily. 30 capsule 12  . docusate sodium (COLACE) 100 MG capsule Take 100 mg by mouth daily.    . famotidine (PEPCID) 40 MG tablet Take 1 tablet (40 mg total) by mouth daily as needed for heartburn or indigestion. 90 tablet 1  . Hyoscyamine Sulfate SL (LEVSIN/SL) 0.125 MG SUBL Place 1 tablet under the tongue every 6 (six) hours as needed. 120 tablet 0  . Multiple Vitamin (MULTIVITAMIN) tablet Take 1 tablet by mouth daily.    . sodium  chloride (MURO 128) 2 % ophthalmic solution 1 drop.    Marland Kitchen tretinoin (RETIN-A) 0.025 % cream Apply 1 application topically every other day.  0   No current facility-administered medications for this visit.    Allergies:   Phenergan [promethazine hcl], Erythromycin, Erythromycin base, Azithromycin, Epinephrine, and Nitrofurantoin   Social History: Social History   Socioeconomic History  . Marital status: Married    Spouse name: Not on file  . Number of children: 2  . Years of education: 31  . Highest education level: Not on file  Occupational History  . Occupation: Retired Therapist, sports  Tobacco Use  . Smoking status:  Former Smoker    Quit date: 02/02/2013    Years since quitting: 6.8  . Smokeless tobacco: Never Used  Substance and Sexual Activity  . Alcohol use: Yes    Alcohol/week: 6.0 standard drinks    Types: 6 Glasses of wine per week  . Drug use: No  . Sexual activity: Yes  Other Topics Concern  . Not on file  Social History Narrative   Lives in [place] with spouse.   Previously Volunteered as a Contractor   Caffeine use: none, quit at least 30 years ago   Social Determinants of Radio broadcast assistant Strain:   . Difficulty of Paying Living Expenses: Not on file  Food Insecurity:   . Worried About Charity fundraiser in the Last Year: Not on file  . Ran Out of Food in the Last Year: Not on file  Transportation Needs:   . Lack of Transportation (Medical): Not on file  . Lack of Transportation (Non-Medical): Not on file  Physical Activity:   . Days of Exercise per Week: Not on file  . Minutes of Exercise per Session: Not on file  Stress:   . Feeling of Stress : Not on file  Social Connections:   . Frequency of Communication with Friends and Family: Not on file  . Frequency of Social Gatherings with Friends and Family: Not on file  . Attends Religious Services: Not on file  . Active Member of Clubs or Organizations: Not on file  . Attends Archivist Meetings: Not on file  . Marital Status: Not on file  Intimate Partner Violence:   . Fear of Current or Ex-Partner: Not on file  . Emotionally Abused: Not on file  . Physically Abused: Not on file  . Sexually Abused: Not on file    Family History: Family History  Problem Relation Age of Onset  . Hypertension Mother   . High Cholesterol Mother   . Cancer Father        head and neck  . Hypertension Sister   . Hypertension Sister   . Diabetes type II Sister        was on steroids   . Hypertension Sister   . Hypertension Sister   . Cancer Sister        bladder  . Early death Neg Hx   . Diabetes  Neg Hx   . Heart disease Neg Hx   . Kidney disease Neg Hx   . Stroke Neg Hx   . Breast cancer Neg Hx     Review of Systems: All other systems reviewed and are otherwise negative except as noted above.   Physical Exam: VS:  BP 134/78   Pulse 95   Ht 5\' 4"  (1.626 m)   Wt 188 lb (85.3 kg)   SpO2 98%  BMI 32.27 kg/m  , BMI Body mass index is 32.27 kg/m. Wt Readings from Last 3 Encounters:  12/26/19 188 lb (85.3 kg)  11/22/19 189 lb (85.7 kg)  11/22/19 189 lb (85.7 kg)    GEN- The patient is elderly appearing, alert and oriented x 3 today.   HEENT: normocephalic, atraumatic; sclera clear, conjunctiva pink; hearing intact; oropharynx clear; neck supple  Lungs- Clear to ausculation bilaterally, normal work of breathing.  No wheezes, rales, rhonchi Heart- Regular rate and rhythm  GI- soft, non-tender, non-distended, bowel sounds present  Extremities- no clubbing, cyanosis, edema MS- no significant deformity or atrophy Skin- warm and dry, no rash or lesion  Psych- euthymic mood, full affect Neuro- strength and sensation are intact   EKG:  EKG is ordered today. The ekg ordered today shows sinus rhythm, rate 95, normal intervals   Recent Labs: 07/29/2019: Magnesium 2.3 10/24/2019: ALT 18; BUN 19; Creatinine, Ser 1.01; Hemoglobin 12.4; Platelets 239.0; Potassium 4.4; Sodium 141; TSH 1.16    Other studies Reviewed: Additional studies/ records that were reviewed today include:Renee's office notes  Assessment and Plan:  1.  PVC's Stable No change required today Continue Norpace  2.  HTN Stable No change required today  3.  Palpitations Clearly different from PVCs Likely ST related to deconditioning/orthostatic intolerance. Her symptoms do not occur with pool walking which is consistent Will obtain D-dimer for completeness as she has noticed worsening symptoms in last 10 days or so.  Recommended continued adequate hydration, pool walking as much as possible (she is at  [facility] and they have a certain amount of times she can sign up for), isometric exercises (booklet given today).  Gave Rx for low dose Metoprolol to take as needed with precautions about hypotension   Current medicines are reviewed at length with the patient today.   The patient does not have concerns regarding her medicines.  The following changes were made today:  none  Labs/ tests ordered today include: D-dimer Orders Placed This Encounter  Procedures  . EKG 12-Lead     Disposition:   Follow up with me in 6 weeks   Signed, Chanetta Marshall, NP 12/26/2019 8:28 AM   [address] [place] [address] [phone] (office) [phone] (fax)

## 2019-12-26 ENCOUNTER — Encounter: Payer: Self-pay | Admitting: Nurse Practitioner

## 2019-12-26 ENCOUNTER — Ambulatory Visit (INDEPENDENT_AMBULATORY_CARE_PROVIDER_SITE_OTHER): Payer: Medicare Other | Admitting: Nurse Practitioner

## 2019-12-26 ENCOUNTER — Telehealth: Payer: Self-pay

## 2019-12-26 VITALS — BP 134/78 | HR 95 | Ht 64.0 in | Wt 188.0 lb

## 2019-12-26 DIAGNOSIS — I10 Essential (primary) hypertension: Secondary | ICD-10-CM

## 2019-12-26 DIAGNOSIS — I493 Ventricular premature depolarization: Secondary | ICD-10-CM | POA: Diagnosis not present

## 2019-12-26 DIAGNOSIS — R002 Palpitations: Secondary | ICD-10-CM | POA: Diagnosis not present

## 2019-12-26 DIAGNOSIS — R0602 Shortness of breath: Secondary | ICD-10-CM

## 2019-12-26 LAB — D-DIMER, QUANTITATIVE: D-DIMER: 0.71 mg/L FEU — ABNORMAL HIGH (ref 0.00–0.49)

## 2019-12-26 MED ORDER — METOPROLOL TARTRATE 25 MG PO TABS: 12.5000 mg | ORAL_TABLET | ORAL | 3 refills | Status: DC | PRN

## 2019-12-26 NOTE — Telephone Encounter (Signed)
-----   Message from Carrie Berthold, NP sent at 12/26/2019 10:54 AM EST ----- Using age adjusted D-dimer cut-off, this is normal for her. Will follow with current plan especially with symptoms and history not consistent with PE.

## 2019-12-26 NOTE — Patient Instructions (Addendum)
Medication Instructions:  Start Metoprolol Tartrate 1/2 tablet (12.5 mg) as needed for palpitations  *If you need a refill on your cardiac medications before your next appointment, please call your pharmacy*  Lab Work:TODAY D- Dimer If you have labs (blood work) drawn today and your tests are completely normal, you will receive your results only by: Marland Kitchen MyChart Message (if you have MyChart) OR . A paper copy in the mail If you have any lab test that is abnormal or we need to change your treatment, we will call you to review the results.  Testing/Procedures: none  Follow-Up: 6 WEEKS WITH AMBER SEILER, NP At [facility], you and your health needs are our priority.  As part of our continuing mission to provide you with exceptional heart care, we have created designated Provider Care Teams.  These Care Teams include your primary Cardiologist (physician) and Advanced Practice Providers (APPs -  Physician Assistants and Nurse Practitioners) who all work together to provide you with the care you need, when you need it.       Other Instructions

## 2019-12-26 NOTE — Telephone Encounter (Signed)
The patient has been notified of the lab result and verbalized understanding.  All questions (if any) were answered. Frederik Schmidt, RN 12/26/2019 11:09 AM

## 2019-12-30 ENCOUNTER — Ambulatory Visit (INDEPENDENT_AMBULATORY_CARE_PROVIDER_SITE_OTHER): Payer: Medicare Other | Admitting: Family Medicine

## 2019-12-30 ENCOUNTER — Other Ambulatory Visit: Payer: Self-pay

## 2019-12-30 ENCOUNTER — Ambulatory Visit: Payer: Medicare Other | Admitting: Physical Therapy

## 2019-12-30 ENCOUNTER — Encounter: Payer: Self-pay | Admitting: Family Medicine

## 2019-12-30 ENCOUNTER — Ambulatory Visit: Payer: Medicare Other | Admitting: Family Medicine

## 2019-12-30 VITALS — BP 122/78 | Temp 97.4°F | Ht 64.0 in | Wt 188.6 lb

## 2019-12-30 DIAGNOSIS — R202 Paresthesia of skin: Secondary | ICD-10-CM

## 2019-12-30 NOTE — Progress Notes (Signed)
Subjective  CC:  Chief Complaint  Patient presents with  . Herpes Zoster    right side of face rash started yesterday. Skin is sensitive and burning. Has history o shingles in 2011.     HPI: Carrie [name] is a 74 y.o. female who presents to the office today to address the problems listed above in the chief complaint.  Right side of face feels mildly swollen and tingling/burning w/o eye pain, redness, blisters, dental or sinus pain. No f/c/s. No h/o rosacea. Has had zostavax and 1st dose of shingrix. No visual changes. Feels well. No twitching.   Assessment  1. Facial paresthesia      Plan   Facial paresthesia w/ mild swelling:  Diff dx includes early shingles, early cellulitis, parotitis, dermatitis or other. It is unclear at this point. I rec advil tonight and monitor for progression. She will message me tomorrow for an update and then I can help with meds if indicated at that time or recommend further workup.   Follow up: prn  Visit date not found  No orders of the defined types were placed in this encounter.  No orders of the defined types were placed in this encounter.     I reviewed the patients updated PMH, FH, and SocHx.    Patient Active Problem List   Diagnosis Date Noted  . Mixed hyperlipidemia 07/31/2019    Priority: High  . History of adenomatous polyp of colon 07/17/2018    Priority: High  . IFG (impaired fasting glucose) 10/21/2016    Priority: High  . Idiopathic peripheral neuropathy 04/08/2016    Priority: High  . Essential hypertension 03/05/2014    Priority: High  . Chronic renal disease, stage 3, moderately decreased glomerular filtration rate (GFR) between 30-59 mL/min/1.73 square meter 05/01/2019    Priority: Medium  . Gastro-esophageal reflux disease without esophagitis 10/20/2016    Priority: Medium  . Osteoarthritis of spine with radiculopathy, lumbar region 11/24/2015    Priority: Medium  . Osteopenia 05/28/2014    Priority: Medium  .  Irritable bowel syndrome (IBS) 03/27/2013    Priority: Medium  . Former smoker 03/06/2013    Priority: Medium  . Symptomatic PVCs 01/03/2012    Priority: Medium  . Vitamin D deficiency disease 05/01/2019    Priority: Low  . Bladder prolapse, female, acquired 09/21/2017    Priority: Low  . Seborrheic dermatitis of scalp 03/06/2013    Priority: Low  . Allergic rhinitis 01/03/2012    Priority: Low  . Gait abnormality 10/14/2019   Current Meds  Medication Sig  . Atorvastatin Calcium (LIPITOR PO) Take 5 mg by mouth daily.  Marland Kitchen azelastine (ASTELIN) 0.1 % nasal spray Place 2 sprays into both nostrils 2 (two) times daily.  . betamethasone dipropionate [id number] % lotion 1 application topical daily  . Biotin 1000 MCG tablet Take 1,000 mcg by mouth daily.  . calcium carbonate (TUMS CALCIUM FOR LIFE BONE) 750 MG chewable tablet Chew 1 tablet by mouth daily.   . Cholecalciferol (VITAMIN D3) 2000 units TABS Take 1 tablet by mouth daily.  . Ciclopirox 1 % shampoo Apply 1 application topically daily as needed (itching).   . disopyramide (NORPACE CR) 150 MG 12 hr capsule Take 1 capsule (150 mg total) by mouth daily.  Marland Kitchen docusate sodium (COLACE) 100 MG capsule Take 100 mg by mouth daily.  . famotidine (PEPCID) 40 MG tablet Take 1 tablet (40 mg total) by mouth daily as needed for heartburn or indigestion.  Marland Kitchen  Hyoscyamine Sulfate SL (LEVSIN/SL) 0.125 MG SUBL Place 1 tablet under the tongue every 6 (six) hours as needed.  . metoprolol tartrate (LOPRESSOR) 25 MG tablet Take 0.5 tablets (12.5 mg total) by mouth as needed.  . Multiple Vitamin (MULTIVITAMIN) tablet Take 1 tablet by mouth daily.  . sodium chloride (MURO 128) 2 % ophthalmic solution 1 drop.  Marland Kitchen tretinoin (RETIN-A) 0.025 % cream Apply 1 application topically every other day.    Allergies: Patient is allergic to phenergan [promethazine hcl]; erythromycin; erythromycin base; azithromycin; epinephrine; and nitrofurantoin. Family History: Patient  family history includes Cancer in her father and sister; Diabetes type II in her sister; High Cholesterol in her mother; Hypertension in her mother, sister, sister, sister, and sister. Social History:  Patient  reports that she quit smoking about 6 years ago. She has never used smokeless tobacco. She reports current alcohol use of about 6.0 standard drinks of alcohol per week. She reports that she does not use drugs.  Review of Systems: Constitutional: Negative for fever malaise or anorexia Cardiovascular: negative for chest pain Respiratory: negative for SOB or persistent cough Gastrointestinal: negative for abdominal pain  Objective  Vitals: BP 122/78   Temp (!) 97.4 F (36.3 C) (Temporal)   Ht 5\' 4"  (1.626 m)   Wt 188 lb 9.6 oz (85.5 kg)   SpO2 97%   BMI 32.37 kg/m  General: no acute distress , A&Ox3 HEENT: PEERL, conjunctiva normal, right facial mild puffiness w/o redness or warmth or rash. No blisters, no parotid enlargment or ttp; no sinus ttp, no crevical lymphadenopathy. Oropharynx moist,neck is supple      Commons side effects, risks, benefits, and alternatives for medications and treatment plan prescribed today were discussed, and the patient expressed understanding of the given instructions. Patient is instructed to call or message via MyChart if he/she has any questions or concerns regarding our treatment plan. No barriers to understanding were identified. We discussed Red Flag symptoms and signs in detail. Patient expressed understanding regarding what to do in case of urgent or emergency type symptoms.   Medication list was reconciled, printed and provided to the patient in AVS. Patient instructions and summary information was reviewed with the patient as documented in the AVS. This note was prepared with assistance of Dragon voice recognition software. Occasional wrong-word or sound-a-like substitutions may have occurred due to the inherent limitations of voice recognition  software  This visit occurred during the SARS-CoV-2 public health emergency.  Safety protocols were in place, including screening questions prior to the visit, additional usage of staff PPE, and extensive cleaning of exam room while observing appropriate contact time as indicated for disinfecting solutions.

## 2019-12-31 ENCOUNTER — Encounter: Payer: Self-pay | Admitting: Family Medicine

## 2019-12-31 ENCOUNTER — Ambulatory Visit: Payer: Medicare Other | Admitting: Physical Therapy

## 2019-12-31 ENCOUNTER — Encounter: Payer: Self-pay | Admitting: Physical Therapy

## 2019-12-31 ENCOUNTER — Other Ambulatory Visit: Payer: Self-pay

## 2019-12-31 DIAGNOSIS — H8111 Benign paroxysmal vertigo, right ear: Secondary | ICD-10-CM | POA: Diagnosis not present

## 2019-12-31 NOTE — Patient Instructions (Signed)
How to Perform the Epley Maneuver The Epley maneuver is an exercise that relieves symptoms of vertigo. Vertigo is the feeling that you or your surroundings are moving when they are not. When you feel vertigo, you may feel like the room is spinning and have trouble walking. Dizziness is a little different than vertigo. When you are dizzy, you may feel unsteady or light-headed. You can do this maneuver at home whenever you have symptoms of vertigo. You can do it up to 3 times a day until your symptoms go away. Even though the Epley maneuver may relieve your vertigo for a few weeks, it is possible that your symptoms will return. This maneuver relieves vertigo, but it does not relieve dizziness. What are the risks? If it is done correctly, the Epley maneuver is considered safe. Sometimes it can lead to dizziness or nausea that goes away after a short time. If you develop other symptoms, such as changes in vision, weakness, or numbness, stop doing the maneuver and call your health care provider. How to perform the Epley maneuver 1. Sit on the edge of a bed or table with your back straight and your legs extended or hanging over the edge of the bed or table. 2. Turn your head halfway toward the affected ear or side. 3. Lie backward quickly with your head turned until you are lying flat on your back. You may want to position a pillow under your shoulders. 4. Hold this position for 30 seconds. You may experience an attack of vertigo. This is normal. 5. Turn your head to the opposite direction until your unaffected ear is facing the floor. 6. Hold this position for 30 seconds. You may experience an attack of vertigo. This is normal. Hold this position until the vertigo stops. 7. Turn your whole body to the same side as your head. Hold for another 30 seconds. 8. Sit back up. You can repeat this exercise up to 3 times a day. Follow these instructions at home:  After doing the Epley maneuver, you can return to  your normal activities.  Ask your health care provider if there is anything you should do at home to prevent vertigo. He or she may recommend that you: ? Keep your head raised (elevated) with two or more pillows while you sleep. ? Do not sleep on the side of your affected ear. ? Get up slowly from bed. ? Avoid sudden movements during the day. ? Avoid extreme head movement, like looking up or bending over. Contact a health care provider if:  Your vertigo gets worse.  You have other symptoms, including: ? Nausea. ? Vomiting. ? Headache. Get help right away if:  You have vision changes.  You have a severe or worsening headache or neck pain.  You cannot stop vomiting.  You have new numbness or weakness in any part of your body. Summary  Vertigo is the feeling that you or your surroundings are moving when they are not.  The Epley maneuver is an exercise that relieves symptoms of vertigo.  If the Epley maneuver is done correctly, it is considered safe. You can do it up to 3 times a day. This information is not intended to replace advice given to you by your health care provider. Make sure you discuss any questions you have with your health care provider. Document Revised: 11/03/2017 Document Reviewed: 10/11/2016    Self Treatment for Right Posterior / Anterior Canalithiasis    Sitting on bed: 1. Turn head 45 right. (  a) Lie back slowly, shoulders on pillow, head on bed. (b) Hold _30___ seconds. 2. Keeping head on bed, turn head 90 left. Hold _30___ seconds. 3. Roll to left, head on 45 angle down toward bed. Hold _30___ seconds. 4. Sit up on left side of bed. Repeat _30___ times per session. Do __1-2__ sessions per day.  Copyright  VHI. All rights reserved.

## 2019-12-31 NOTE — Therapy (Signed)
[facility] [address] [place], [place], [address] Phone: [phone]   Fax:  [phone]  Physical Therapy Treatment  Patient Details  Name: Carrie [name] MRN: [id number] Date of Birth: 1946/03/27 Referring Provider (PT): Jovita Gamma, MD   Encounter Date: 12/31/2019  PT End of Session - 12/31/19 1414    Visit Number  9   visit 3 for BPPV   Date for PT Re-Evaluation  02/04/20    Authorization Type  medicare    Authorization Time Period  11-05-19 -02-02-20    PT Start Time  1147    PT Stop Time  1230    PT Time Calculation (min)  43 min    Activity Tolerance  Patient tolerated treatment well    Behavior During Therapy  [facility] for tasks assessed/performed       Past Medical History:  Diagnosis Date  . BPPV (benign paroxysmal positional vertigo), bilateral 04/11/2019  . Colon polyps   . Esophageal spasm   . Fatigue   . Frequent PVCs   . GERD (gastroesophageal reflux disease)   . IBS (irritable bowel syndrome)   . Palpitations   . Recurrent UTI   . Seborrheic dermatitis of scalp 03/06/13  . Vitamin B 12 deficiency     Past Surgical History:  Procedure Laterality Date  . CHOLECYSTECTOMY N/A 08/23/2016   Procedure: LAPAROSCOPIC CHOLECYSTECTOMY;  Surgeon: Rolm Bookbinder, MD;  Location: [facility];  Service: General;  Laterality: N/A;  . COLECTOMY Right 1998   large colonic polyp  . ENDOSCOPIC VEIN LASER TREATMENT Bilateral 2010-2011  . KNEE ARTHROSCOPY W/ MENISCECTOMY Left   . [place]  2001  . SPINE SURGERY     3 since 11/2010, laminectomy, May 2012 lumbar fusion, diskectomy    There were no vitals filed for this visit.  Subjective Assessment - 12/31/19 1404    Subjective  Pt states she rolled over onto her right side in bed this morning and had severe vertigo; pt called requesting an appt ASAP due to re-occurrence of BPPV    Patient is accompained by:  Family member   husband, Charles   Pertinent History  lumbar surgeries: 11/2010 and 01/2011 discectomy surgeries.  L4-5 lumbar fusion 2012.  Lt knee surgery: 12/18/2018    Currently in Pain?  Yes    Pain Score  3     Pain Location  Back    Pain Orientation  Lower    Pain Descriptors / Indicators  Aching;Dull    Pain Type  Chronic pain    Pain Onset  More than a month ago    Pain Frequency  Constant             Vestibular Assessment - 12/31/19 0001      Positional Testing   Dix-Hallpike  Dix-Hallpike Right;Dix-Hallpike Left    Sidelying Test  Sidelying Right;Sidelying Left      Dix-Hallpike Right   Dix-Hallpike Right Duration  approx. 10 secs    Dix-Hallpike Right Symptoms  Upbeat, right rotatory nystagmus      Dix-Hallpike Left   Dix-Hallpike Left Duration  none    Dix-Hallpike Left Symptoms  No nystagmus      Sidelying Right   Sidelying Right Duration  minimal nystagmus with pt reporting that "it's going to start"    Sidelying Right Symptoms  Upbeat, right rotatory nystagmus      Sidelying Left   Sidelying Left Duration  none    Sidelying Left Symptoms  No  nystagmus       Pt was treated with Epley maneuver for Rt BPPV - 3 reps;  No symptoms present on 2nd or 3rd rep;  Pt requested that husband be instructed And observe treatment so he attended session during 3rd rep of Epley for instruction and to assist pt with maneuver should she have re-occurrence Of BPPV in future  No follow up appt for BPPV needed at this time due to resolution of symptoms  Pt is continuing to receive ortho PT to address back pain                PT Education - 12/31/19 1411    Education Details  pt (and husband to assist patient)  instructed in Epley maneuver for self treatment for re-occurrence of BPPV prn    Person(s) Educated  Patient;Spouse    Methods  Explanation;Demonstration;Handout    Comprehension  Verbalized understanding;Returned demonstration               Plan - 12/31/19 1415    Clinical  Impression Statement  Pt has (+) Rt Dix-Hallpike test with rotary upbeating nystagmus indicative of Rt BPPV posterior canalithiasis.  Epley maneuver was performed for 3 reps with no nystagmus and no vertigo reported on reps 2 & 3 - indicative of resolution of Rt BPPV.  Pt and husband instructed in correct technique for performing Epley manuever at home for self treatment prn should pt have a re-occurrence of BPPV.    Personal Factors and Comorbidities  Comorbidity 2    Comorbidities  3 lumbar surgeries, Lt knee surgery    Examination-Activity Limitations  Caring for Others;Carry;Squat;Stand;Stairs;Locomotion Level    Examination-Participation Restrictions  Community Activity;Meal Prep;Cleaning;Laundry;Shop    Stability/Clinical Decision Making  Evolving/Moderate complexity    Rehab Potential  Good    PT Frequency  2x / week    PT Duration  8 weeks    PT Treatment/Interventions  Patient/family education;ADLs/Self Care Home Management;Moist Heat;Electrical Stimulation;Stair training;Gait training;Functional mobility training;Neuromuscular re-education;Balance training;Therapeutic exercise;Therapeutic activities;Manual techniques;Taping;Dry needling;Passive range of motion    PT Next Visit Plan  Claiborne Billings - I saw her on 12-31-19 for BPPV re-occurrence - I see that visit # is 9 total and my visit is #3 for BPPV; I am sending recert for the BPPV to Dr. Jaynee Eagles but she may need 10th visit progress note for the ortho - sorry for all the confusion; Pt to see her cardiologist Thursday of this wek. Monitor O2 in the meantime and gently work on lumbar and hip flexibility and gentle core strengh.    PT Home Exercise Plan  Access Code: [facility]    Consulted and Agree with Plan of Care  Patient       Patient will benefit from skilled therapeutic intervention in order to improve the following deficits and impairments:  Abnormal gait, Decreased activity tolerance, Decreased strength, Impaired flexibility, Difficulty  walking, Decreased mobility, Decreased endurance, Improper body mechanics, Postural dysfunction, Increased muscle spasms  Visit Diagnosis: BPPV (benign paroxysmal positional vertigo), right - Plan: PT plan of care cert/re-cert     Problem List Patient Active Problem List   Diagnosis Date Noted  . Gait abnormality 10/14/2019  . Mixed hyperlipidemia 07/31/2019  . Vitamin D deficiency disease 05/01/2019  . Chronic renal disease, stage 3, moderately decreased glomerular filtration rate (GFR) between 30-59 mL/min/1.73 square meter 05/01/2019  . History of adenomatous polyp of colon 07/17/2018  . Bladder prolapse, female, acquired 09/21/2017  . IFG (impaired fasting glucose) 10/21/2016  . Gastro-esophageal  reflux disease without esophagitis 10/20/2016  . Idiopathic peripheral neuropathy 04/08/2016  . Osteoarthritis of spine with radiculopathy, lumbar region 11/24/2015  . Osteopenia 05/28/2014  . Essential hypertension 03/05/2014  . Irritable bowel syndrome (IBS) 03/27/2013  . Former smoker 03/06/2013  . Seborrheic dermatitis of scalp 03/06/2013  . Allergic rhinitis 01/03/2012  . Symptomatic PVCs 01/03/2012    Alda Lea, PT 12/31/2019, 2:37 PM  [facility] [address] [place], [place], [address] Phone: [phone]   Fax:  [phone]  Name: Carrie [name] MRN: [id number] Date of Birth: [birth year]

## 2020-01-01 ENCOUNTER — Ambulatory Visit: Payer: Medicare Other

## 2020-01-01 DIAGNOSIS — M545 Low back pain, unspecified: Secondary | ICD-10-CM

## 2020-01-01 DIAGNOSIS — R293 Abnormal posture: Secondary | ICD-10-CM | POA: Diagnosis not present

## 2020-01-01 DIAGNOSIS — G8929 Other chronic pain: Secondary | ICD-10-CM | POA: Diagnosis not present

## 2020-01-01 DIAGNOSIS — R2689 Other abnormalities of gait and mobility: Secondary | ICD-10-CM

## 2020-01-01 DIAGNOSIS — M6281 Muscle weakness (generalized): Secondary | ICD-10-CM | POA: Diagnosis not present

## 2020-01-01 NOTE — Therapy (Signed)
[facility] [address], [place], [place], [address] Phone: [phone]   Fax:  [phone]  Physical Therapy Treatment  Patient Details  Name: Carrie [name] MRN: [id number] Date of Birth: [birth year] Referring Provider (PT): Jovita Gamma, MD   Encounter Date: 01/01/2020 Progress Note Reporting Period 11/05/2019 to 01/01/2020  See note below for Objective Data and Assessment of Progress/Goals.      PT End of Session - 01/01/20 1054    Visit Number  10   3 BPPV, 7 ortho   Date for PT Re-Evaluation  02/04/20    PT Start Time  1016    PT Stop Time  1055    PT Time Calculation (min)  39 min    Activity Tolerance  Patient tolerated treatment well    Behavior During Therapy  [facility] for tasks assessed/performed       Past Medical History:  Diagnosis Date  . BPPV (benign paroxysmal positional vertigo), bilateral 04/11/2019  . Colon polyps   . Esophageal spasm   . Fatigue   . Frequent PVCs   . GERD (gastroesophageal reflux disease)   . IBS (irritable bowel syndrome)   . Palpitations   . Recurrent UTI   . Seborrheic dermatitis of scalp 03/06/13  . Vitamin B 12 deficiency     Past Surgical History:  Procedure Laterality Date  . CHOLECYSTECTOMY N/A 08/23/2016   Procedure: LAPAROSCOPIC CHOLECYSTECTOMY;  Surgeon: Rolm Bookbinder, MD;  Location: [facility];  Service: General;  Laterality: N/A;  . COLECTOMY Right 1998   large colonic polyp  . ENDOSCOPIC VEIN LASER TREATMENT Bilateral 2010-2011  . KNEE ARTHROSCOPY W/ MENISCECTOMY Left   . [place]  2001  . SPINE SURGERY     3 since 11/2010, laminectomy, May 2012 lumbar fusion, diskectomy    There were no vitals filed for this visit.  Subjective Assessment - 01/01/20 1018    Subjective  I had vertigo yesterday and went for treatment.  I just don't feel good today.  I saw the cardiologist.  She changed my meds and gave me exercises.    Pertinent History   lumbar surgeries: 11/2010 and 01/2011 discectomy surgeries.  L4-5 lumbar fusion 2012.  Lt knee surgery: 12/18/2018    Patient Stated Goals  improve strength, LE endurance and reduce pain    Currently in Pain?  Yes    Pain Score  2     Pain Location  Back    Pain Orientation  Lower    Pain Descriptors / Indicators  Aching;Dull    Pain Type  Chronic pain    Pain Onset  More than a month ago    Pain Frequency  Constant    Aggravating Factors   standing, in the morning    Pain Relieving Factors  stretching, heat, movement         [facility] PT Assessment - 01/01/20 0001      Assessment   Medical Diagnosis  lumbar stenosis with neurogenic claudication    Referring Provider (PT)  Jovita Gamma, MD    Onset Date/Surgical Date  12/09/18      Prior Function   Level of Independence  Independent                   [facility] Adult PT Treatment/Exercise - 01/01/20 0001      Lumbar Exercises: Stretches   Single Knee to Chest Stretch  Right;Left;2 reps;10 seconds    Lower Trunk Rotation  2 reps;30  seconds    Piriformis Stretch  Right;Left;2 reps;20 seconds    Other Lumbar Stretch Exercise  leg lengthener stretch bil 3 sec hold 5x       Lumbar Exercises: Seated   Other Seated Lumbar Exercises  press into foam roll: 5" hold x10      Knee/Hip Exercises: Supine   Other Supine Knee/Hip Exercises  hip adduction: ball squeeze 5" 2x10, yellow band abduction 2x10               PT Short Term Goals - 01/01/20 1022      PT SHORT TERM GOAL #2   Title  perform 5x sit to stand in < or = to 11.5 seconds to reduce falls risk    Time  4    Period  Weeks    Status  On-going      PT SHORT TERM GOAL #3   Title  demonstrate heel strike bilaterally with gait on level surface > or = to 50% of the time to reduce falls risk    Time  4    Period  Weeks    Status  On-going        PT Long Term Goals - 12/31/19 1422      PT LONG TERM GOAL #1   Title  be independent in advanced HEP     Time  8    Period  Weeks    Status  New      PT LONG TERM GOAL #2   Title  demonstrate t to 4+/5 bil hip and knee strength to improve endurance for community activity    Time  8    Period  Weeks    Status  New      PT LONG TERM GOAL #3   Title  reduce FOTO to < or = to 47% limitation    Time  8    Period  Weeks    Status  New      PT LONG TERM GOAL #4   Title  perform regular walking for exercise and verbalize understanding of safe progression    Time  8    Period  Weeks    Status  New      PT LONG TERM GOAL #5   Title  improve LE strength and endurance to walk for 20-25 minutes without limitation    Time  8    Period  Weeks    Status  New      PT LONG TERM GOAL #6   Title  report a 50% reduction in LBP with standing and walking to improve safety and independepence in the community    Time  8    Period  Weeks    Status  New            Plan - 01/01/20 1031    Clinical Impression Statement  Pt is on 10th visit (Orthopedic visits + visits for BPPV).  Pt has experienced cardiac symptoms including tachycardia and had to limit her activity due to this.  Sessions have been limited to strengthening and flexibility in sitting/supine.  Pt continues to exercise in the pool and perform HEP issued by PT for strength and flexibility.  Pt is also walking 5 minutes, 3x/day.   Pt with minimal progress toward goals due to chronic nature of condition and medical set backs.  Pt will continue to benefit from skilled PT to address strength, endurance and gait deficits to improve safety and independence at  home and in the community.    PT Frequency  2x / week    PT Duration  8 weeks    PT Treatment/Interventions  Patient/family education;ADLs/Self Care Home Management;Moist Heat;Electrical Stimulation;Stair training;Gait training;Functional mobility training;Neuromuscular re-education;Balance training;Therapeutic exercise;Therapeutic activities;Manual techniques;Taping;Dry needling;Passive  range of motion    PT Next Visit Plan  Continue strength and endurance progression as able.    PT Home Exercise Plan  Access Code: [facility]    Consulted and Agree with Plan of Care  Patient       Patient will benefit from skilled therapeutic intervention in order to improve the following deficits and impairments:  Abnormal gait, Decreased activity tolerance, Decreased strength, Impaired flexibility, Difficulty walking, Decreased mobility, Decreased endurance, Improper body mechanics, Postural dysfunction, Increased muscle spasms  Visit Diagnosis: Chronic bilateral low back pain without sciatica  Muscle weakness (generalized)  Abnormal posture  Other abnormalities of gait and mobility     Problem List Patient Active Problem List   Diagnosis Date Noted  . Gait abnormality 10/14/2019  . Mixed hyperlipidemia 07/31/2019  . Vitamin D deficiency disease 05/01/2019  . Chronic renal disease, stage 3, moderately decreased glomerular filtration rate (GFR) between 30-59 mL/min/1.73 square meter 05/01/2019  . History of adenomatous polyp of colon 07/17/2018  . Bladder prolapse, female, acquired 09/21/2017  . IFG (impaired fasting glucose) 10/21/2016  . Gastro-esophageal reflux disease without esophagitis 10/20/2016  . Idiopathic peripheral neuropathy 04/08/2016  . Osteoarthritis of spine with radiculopathy, lumbar region 11/24/2015  . Osteopenia 05/28/2014  . Essential hypertension 03/05/2014  . Irritable bowel syndrome (IBS) 03/27/2013  . Former smoker 03/06/2013  . Seborrheic dermatitis of scalp 03/06/2013  . Allergic rhinitis 01/03/2012  . Symptomatic PVCs 01/03/2012    Sigurd Sos, PT 01/01/20 10:57 AM  [facility] [address] [place], [place], [address] Phone: [phone]   Fax:  [phone]  Name: MCKAY NOTZ MRN: [id number] Date of Birth: May 25, 1946

## 2020-01-06 ENCOUNTER — Encounter: Payer: Self-pay | Admitting: Physical Therapy

## 2020-01-06 ENCOUNTER — Ambulatory Visit: Payer: Medicare Other | Attending: Neurosurgery | Admitting: Physical Therapy

## 2020-01-06 ENCOUNTER — Other Ambulatory Visit: Payer: Self-pay

## 2020-01-06 DIAGNOSIS — M545 Low back pain: Secondary | ICD-10-CM | POA: Insufficient documentation

## 2020-01-06 DIAGNOSIS — R293 Abnormal posture: Secondary | ICD-10-CM | POA: Diagnosis not present

## 2020-01-06 DIAGNOSIS — G8929 Other chronic pain: Secondary | ICD-10-CM | POA: Diagnosis not present

## 2020-01-06 DIAGNOSIS — M6281 Muscle weakness (generalized): Secondary | ICD-10-CM | POA: Insufficient documentation

## 2020-01-06 DIAGNOSIS — R2689 Other abnormalities of gait and mobility: Secondary | ICD-10-CM | POA: Diagnosis not present

## 2020-01-06 NOTE — Therapy (Signed)
[facility] [address], [place], [place], [address] Phone: [phone]   Fax:  [phone]  Physical Therapy Treatment  Patient Details  Name: Carrie [name] MRN: [id number] Date of Birth: Feb 16, 1946 Referring Provider (PT): Jovita Gamma, MD   Encounter Date: 01/06/2020  PT End of Session - 01/06/20 1359    Visit Number  11    Date for PT Re-Evaluation  02/04/20    Authorization Type  medicare    Authorization Time Period  11-05-19 -02-02-20    PT Start Time  1400    PT Stop Time  1440    PT Time Calculation (min)  40 min    Activity Tolerance  Patient tolerated treatment well    Behavior During Therapy  [facility] for tasks assessed/performed       Past Medical History:  Diagnosis Date  . BPPV (benign paroxysmal positional vertigo), bilateral 04/11/2019  . Colon polyps   . Esophageal spasm   . Fatigue   . Frequent PVCs   . GERD (gastroesophageal reflux disease)   . IBS (irritable bowel syndrome)   . Palpitations   . Recurrent UTI   . Seborrheic dermatitis of scalp 03/06/13  . Vitamin B 12 deficiency     Past Surgical History:  Procedure Laterality Date  . CHOLECYSTECTOMY N/A 08/23/2016   Procedure: LAPAROSCOPIC CHOLECYSTECTOMY;  Surgeon: Rolm Bookbinder, MD;  Location: [facility];  Service: General;  Laterality: N/A;  . COLECTOMY Right 1998   large colonic polyp  . ENDOSCOPIC VEIN LASER TREATMENT Bilateral 2010-2011  . KNEE ARTHROSCOPY W/ MENISCECTOMY Left   . [place]  2001  . SPINE SURGERY     3 since 11/2010, laminectomy, May 2012 lumbar fusion, diskectomy    There were no vitals filed for this visit.  Subjective Assessment - 01/06/20 1403    Subjective  Low BP high HR situation conitnues. Back is doing better, I can walk about 15 minutes without pain.    Pertinent History  lumbar surgeries: 11/2010 and 01/2011 discectomy surgeries.  L4-5 lumbar fusion 2012.  Lt knee surgery: 12/18/2018    Currently in Pain?  No/denies    Multiple Pain Sites  No                       [facility] Adult PT Treatment/Exercise - 01/06/20 0001      Self-Care   Self-Care  Other Self-Care Comments    Other Self-Care Comments   Body Mechanics ffor ADLs that eliminate stooping and bending      Lumbar Exercises: Stretches   Active Hamstring Stretch  Right;Left;2 reps;20 seconds    Single Knee to Chest Stretch  Right;Left;3 reps;10 seconds    Lower Trunk Rotation  2 reps;30 seconds    Piriformis Stretch  Right;Left;2 reps;20 seconds      Lumbar Exercises: Supine   Clam  20 reps    Clam Limitations  Red band with VC for core activation    Bent Knee Raise  10 reps    Bent Knee Raise Limitations  VC for > core activation    Bridge  10 reps;2 seconds    Bridge Limitations  VC to start with small ROM                PT Short Term Goals - 01/01/20 1022      PT SHORT TERM GOAL #2   Title  perform 5x sit to stand in < or = to  11.5 seconds to reduce falls risk    Time  4    Period  Weeks    Status  On-going      PT SHORT TERM GOAL #3   Title  demonstrate heel strike bilaterally with gait on level surface > or = to 50% of the time to reduce falls risk    Time  4    Period  Weeks    Status  On-going        PT Long Term Goals - 12/31/19 1422      PT LONG TERM GOAL #1   Title  be independent in advanced HEP    Time  8    Period  Weeks    Status  New      PT LONG TERM GOAL #2   Title  demonstrate t to 4+/5 bil hip and knee strength to improve endurance for community activity    Time  8    Period  Weeks    Status  New      PT LONG TERM GOAL #3   Title  reduce FOTO to < or = to 47% limitation    Time  8    Period  Weeks    Status  New      PT LONG TERM GOAL #4   Title  perform regular walking for exercise and verbalize understanding of safe progression    Time  8    Period  Weeks    Status  New      PT LONG TERM GOAL #5   Title  improve LE strength and  endurance to walk for 20-25 minutes without limitation    Time  8    Period  Weeks    Status  New      PT LONG TERM GOAL #6   Title  report a 50% reduction in LBP with standing and walking to improve safety and independepence in the community    Time  8    Period  Weeks    Status  New            Plan - 01/06/20 1359    Clinical Impression Statement  PT continues to struggle with her heart rate and low blood pressure issues. She is now able to walk about 15 on land without back pain. She reports her back really hurts with bending and stooping activities. PTA educated pt on ways to peform certain ADLS eliminating the stoop/bend. Pt reports she will work on changing things at home.    Personal Factors and Comorbidities  Comorbidity 2    Comorbidities  3 lumbar surgeries, Lt knee surgery    Examination-Activity Limitations  Caring for Others;Carry;Squat;Stand;Stairs;Locomotion Level    Examination-Participation Restrictions  Community Activity;Meal Prep;Cleaning;Laundry;Shop    Stability/Clinical Decision Making  Evolving/Moderate complexity    Rehab Potential  Good    PT Frequency  2x / week    PT Duration  8 weeks    PT Treatment/Interventions  Patient/family education;ADLs/Self Care Home Management;Moist Heat;Electrical Stimulation;Stair training;Gait training;Functional mobility training;Neuromuscular re-education;Balance training;Therapeutic exercise;Therapeutic activities;Manual techniques;Taping;Dry needling;Passive range of motion    PT Next Visit Plan  Continue strength and endurance progression as able.    PT Home Exercise Plan  Access Code: [facility]    Consulted and Agree with Plan of Care  Patient       Patient will benefit from skilled therapeutic intervention in order to improve the following deficits and impairments:  Abnormal gait, Decreased activity tolerance,  Decreased strength, Impaired flexibility, Difficulty walking, Decreased mobility, Decreased endurance,  Improper body mechanics, Postural dysfunction, Increased muscle spasms  Visit Diagnosis: Chronic bilateral low back pain without sciatica  Muscle weakness (generalized)  Abnormal posture  Other abnormalities of gait and mobility     Problem List Patient Active Problem List   Diagnosis Date Noted  . Gait abnormality 10/14/2019  . Mixed hyperlipidemia 07/31/2019  . Vitamin D deficiency disease 05/01/2019  . Chronic renal disease, stage 3, moderately decreased glomerular filtration rate (GFR) between 30-59 mL/min/1.73 square meter 05/01/2019  . History of adenomatous polyp of colon 07/17/2018  . Bladder prolapse, female, acquired 09/21/2017  . IFG (impaired fasting glucose) 10/21/2016  . Gastro-esophageal reflux disease without esophagitis 10/20/2016  . Idiopathic peripheral neuropathy 04/08/2016  . Osteoarthritis of spine with radiculopathy, lumbar region 11/24/2015  . Osteopenia 05/28/2014  . Essential hypertension 03/05/2014  . Irritable bowel syndrome (IBS) 03/27/2013  . Former smoker 03/06/2013  . Seborrheic dermatitis of scalp 03/06/2013  . Allergic rhinitis 01/03/2012  . Symptomatic PVCs 01/03/2012    Jackston Oaxaca, PTA 01/06/2020, 2:47 PM  [facility] [address], [place], [place], [address] Phone: [phone]   Fax:  [phone]  Name: CHAPIN HALLY MRN: [id number] Date of Birth: September 16, 1946

## 2020-01-07 ENCOUNTER — Telehealth: Payer: Self-pay | Admitting: Nurse Practitioner

## 2020-01-07 NOTE — Telephone Encounter (Signed)
I spoke to the patient and told her that we would get Amber's advisement on 2/3 about the patient's BP.  She said that she wakes up in the morning, washes her face, gets dressed and by the time she's done her BP has dropped to 80/? And HR near 110 bpm and feels very weak.     During the day she feels fine and BP stabilizes.  She stopped taking the Metoprolol for palpitations, because of hypotension.  Please advise, thank you.

## 2020-01-07 NOTE — Telephone Encounter (Signed)
Patient calling Pt c/o BP issue: STAT if pt c/o blurred vision, one-sided weakness or slurred speech  1. What are your last 5 BP readings? 105/79 after exercising this morning, too low to register HR 110, 109/78 after walking, 86/68, 122/83, 82/69, 98/78, 117/80 HR 90  2. Are you having any other symptoms (ex. Dizziness, headache, blurred vision, passed out)? No, just weak  3. What is your BP issue? Patient states her BP has been very low in the morning and her HR has still been high. She states her BP goes up after exercise and she is not sure if she should take her metoprolol or not.

## 2020-01-07 NOTE — Telephone Encounter (Signed)
Pt called to report that she has been exercising three times a week and has increased her fluids significantly, her urine is very clear... but she is still having problems with a low BP... this morning her BP at 8:30 am was not registering and her HR was 110, after exercise a couple of hours later her BP 105/79 and HR 107.   She has stopped the metoprolol a few days ago.. her past BP readings have been 86/68, 82/69, 98/78 pre exercise. She reports that she feels very weak and tired every morning with these readings. Hr has been in the 90's.   I advised her I will forward to [employer] but she should continue with her hydration and to be sure she is eating well and to continue to monitor.

## 2020-01-08 ENCOUNTER — Encounter: Payer: Self-pay | Admitting: Physical Therapy

## 2020-01-08 ENCOUNTER — Ambulatory Visit: Payer: Medicare Other | Admitting: Physical Therapy

## 2020-01-08 ENCOUNTER — Other Ambulatory Visit: Payer: Self-pay

## 2020-01-08 DIAGNOSIS — R293 Abnormal posture: Secondary | ICD-10-CM | POA: Diagnosis not present

## 2020-01-08 DIAGNOSIS — M545 Low back pain: Secondary | ICD-10-CM | POA: Diagnosis not present

## 2020-01-08 DIAGNOSIS — R2689 Other abnormalities of gait and mobility: Secondary | ICD-10-CM

## 2020-01-08 DIAGNOSIS — M6281 Muscle weakness (generalized): Secondary | ICD-10-CM | POA: Diagnosis not present

## 2020-01-08 DIAGNOSIS — G8929 Other chronic pain: Secondary | ICD-10-CM | POA: Diagnosis not present

## 2020-01-08 NOTE — Therapy (Signed)
[facility] [address] [place], [place], [address] Phone: [phone]   Fax:  [phone]  Physical Therapy Treatment  Patient Details  Name: Carrie [name] MRN: [id number] Date of Birth: 1946-08-02 Referring Provider (PT): Jovita Gamma, MD   Encounter Date: 01/08/2020  PT End of Session - 01/08/20 1359    Visit Number  12    Date for PT Re-Evaluation  02/04/20    Authorization Type  medicare    Authorization Time Period  11-05-19 -02-02-20    PT Start Time  1359    PT Stop Time  1444    PT Time Calculation (min)  45 min    Activity Tolerance  Patient tolerated treatment well    Behavior During Therapy  [facility] for tasks assessed/performed       Past Medical History:  Diagnosis Date  . BPPV (benign paroxysmal positional vertigo), bilateral 04/11/2019  . Colon polyps   . Esophageal spasm   . Fatigue   . Frequent PVCs   . GERD (gastroesophageal reflux disease)   . IBS (irritable bowel syndrome)   . Palpitations   . Recurrent UTI   . Seborrheic dermatitis of scalp 03/06/13  . Vitamin B 12 deficiency     Past Surgical History:  Procedure Laterality Date  . CHOLECYSTECTOMY N/A 08/23/2016   Procedure: LAPAROSCOPIC CHOLECYSTECTOMY;  Surgeon: Rolm Bookbinder, MD;  Location: [facility];  Service: General;  Laterality: N/A;  . COLECTOMY Right 1998   large colonic polyp  . ENDOSCOPIC VEIN LASER TREATMENT Bilateral 2010-2011  . KNEE ARTHROSCOPY W/ MENISCECTOMY Left   . [place]  2001  . SPINE SURGERY     3 since 11/2010, laminectomy, May 2012 lumbar fusion, diskectomy    There were no vitals filed for this visit.  Subjective Assessment - 01/08/20 1402    Subjective  I have a call in to my cardiologist due to my fluctuating heart rate & BP.    Pertinent History  lumbar surgeries: 11/2010 and 01/2011 discectomy surgeries.  L4-5 lumbar fusion 2012.  Lt knee surgery: 12/18/2018    Currently in Pain?  No/denies    no pain, but I feel stiff   Pain Descriptors / Indicators  Tightness    Multiple Pain Sites  No                       [facility] Adult PT Treatment/Exercise - 01/08/20 0001      Lumbar Exercises: Stretches   Active Hamstring Stretch  Right;Left;2 reps;30 seconds    Single Knee to Chest Stretch  Right;Left;3 reps;20 seconds    Lower Trunk Rotation  2 reps;30 seconds    Pelvic Tilt  --   ball & glute cocontraction 3 sec hold 10x   Piriformis Stretch  Right;Left;2 reps;30 seconds    Other Lumbar Stretch Exercise  Seated side stretching with stick overhead 5x bil    Other Lumbar Stretch Exercise  seated cat/camel 10x      Lumbar Exercises: Supine   Clam  --   2x15 red Vc to PPT into heat   Clam Limitations  Red band with VC for core activation    Bent Knee Raise  20 reps    Bent Knee Raise Limitations  VC for > core activation    Bridge  --   2x5 with ball squeeze     Knee/Hip Exercises: Stretches   Other Knee/Hip Stretches  Butterfly stretch 1 min  PT Short Term Goals - 01/08/20 1410      PT SHORT TERM GOAL #3   Title  demonstrate heel strike bilaterally with gait on level surface > or = to 50% of the time to reduce falls risk    Time  4    Period  Weeks    Status  Achieved      PT SHORT TERM GOAL #4   Title  improve endurance to walk for 10-15 minutes without significant fatigue to improve community endurance    Time  4    Period  Weeks    Status  Achieved   Walking 60 min in water, 10-15 min land   Target Date  01/07/20        PT Long Term Goals - 12/31/19 1422      PT LONG TERM GOAL #1   Title  be independent in advanced HEP    Time  8    Period  Weeks    Status  New      PT LONG TERM GOAL #2   Title  demonstrate t to 4+/5 bil hip and knee strength to improve endurance for community activity    Time  8    Period  Weeks    Status  New      PT LONG TERM GOAL #3   Title  reduce FOTO to < or = to 47% limitation    Time  8     Period  Weeks    Status  New      PT LONG TERM GOAL #4   Title  perform regular walking for exercise and verbalize understanding of safe progression    Time  8    Period  Weeks    Status  New      PT LONG TERM GOAL #5   Title  improve LE strength and endurance to walk for 20-25 minutes without limitation    Time  8    Period  Weeks    Status  New      PT LONG TERM GOAL #6   Title  report a 50% reduction in LBP with standing and walking to improve safety and independepence in the community    Time  8    Period  Weeks    Status  New            Plan - 01/08/20 1403    Clinical Impression Statement  Pt walking more in pool than on land: 60 min in water and 10-15 on land meeting STG.Pt continues with HR and BP fluctuations. She currently has a call into her cardiologist. pt is now walking with consistent heel strike, also meeting STG.    Personal Factors and Comorbidities  Comorbidity 2    Comorbidities  3 lumbar surgeries, Lt knee surgery    Examination-Activity Limitations  Caring for Others;Carry;Squat;Stand;Stairs;Locomotion Level    Examination-Participation Restrictions  Community Activity;Meal Prep;Cleaning;Laundry;Shop    Stability/Clinical Decision Making  Evolving/Moderate complexity    Rehab Potential  Good    PT Frequency  2x / week    PT Duration  8 weeks    PT Treatment/Interventions  Patient/family education;ADLs/Self Care Home Management;Moist Heat;Electrical Stimulation;Stair training;Gait training;Functional mobility training;Neuromuscular re-education;Balance training;Therapeutic exercise;Therapeutic activities;Manual techniques;Taping;Dry needling;Passive range of motion    PT Next Visit Plan  MMT HIPS for goals, make sure pt has the stabiliaztion ex for HEP    PT Home Exercise Plan  Access Code: [facility]    Consulted and  Agree with Plan of Care  Patient       Patient will benefit from skilled therapeutic intervention in order to improve the following  deficits and impairments:  Abnormal gait, Decreased activity tolerance, Decreased strength, Impaired flexibility, Difficulty walking, Decreased mobility, Decreased endurance, Improper body mechanics, Postural dysfunction, Increased muscle spasms  Visit Diagnosis: Chronic bilateral low back pain without sciatica  Muscle weakness (generalized)  Abnormal posture  Other abnormalities of gait and mobility     Problem List Patient Active Problem List   Diagnosis Date Noted  . Gait abnormality 10/14/2019  . Mixed hyperlipidemia 07/31/2019  . Vitamin D deficiency disease 05/01/2019  . Chronic renal disease, stage 3, moderately decreased glomerular filtration rate (GFR) between 30-59 mL/min/1.73 square meter 05/01/2019  . History of adenomatous polyp of colon 07/17/2018  . Bladder prolapse, female, acquired 09/21/2017  . IFG (impaired fasting glucose) 10/21/2016  . Gastro-esophageal reflux disease without esophagitis 10/20/2016  . Idiopathic peripheral neuropathy 04/08/2016  . Osteoarthritis of spine with radiculopathy, lumbar region 11/24/2015  . Osteopenia 05/28/2014  . Essential hypertension 03/05/2014  . Irritable bowel syndrome (IBS) 03/27/2013  . Former smoker 03/06/2013  . Seborrheic dermatitis of scalp 03/06/2013  . Allergic rhinitis 01/03/2012  . Symptomatic PVCs 01/03/2012    Nitish Roes, PTA 01/08/2020, 4:15 PM  [facility] [address] [place], [place], [address] Phone: [phone]   Fax:  [phone]  Name: RUKIA GUNDERSON MRN: [id number] Date of Birth: Oct 06, 1946

## 2020-01-09 MED ORDER — MIDODRINE HCL 2.5 MG PO TABS: 2.5000 mg | ORAL_TABLET | Freq: Two times a day (BID) | ORAL | 3 refills | Status: DC

## 2020-01-09 NOTE — Telephone Encounter (Signed)
Legrand Como,  Let's have her try Midodrine 2.5mg  twice daily. Please let her know that she needs to be upright while taking and for 3 hours afterwards (so don't take if going to take a nap or go to bed).  Have her take 1 tablet on awakening and 1 tablet around lunch.  Thanks! Chanetta Marshall, NP 01/09/2020 1:46 PM

## 2020-01-09 NOTE — Telephone Encounter (Signed)
I spoke to the patient with Carrie [name]'s recommendation to start Midodrine 2.5 mg bid and record results.  She verbalized understanding.

## 2020-01-13 ENCOUNTER — Other Ambulatory Visit: Payer: Self-pay

## 2020-01-13 ENCOUNTER — Ambulatory Visit: Payer: Medicare Other

## 2020-01-13 DIAGNOSIS — R2689 Other abnormalities of gait and mobility: Secondary | ICD-10-CM | POA: Diagnosis not present

## 2020-01-13 DIAGNOSIS — R293 Abnormal posture: Secondary | ICD-10-CM | POA: Diagnosis not present

## 2020-01-13 DIAGNOSIS — M6281 Muscle weakness (generalized): Secondary | ICD-10-CM | POA: Diagnosis not present

## 2020-01-13 DIAGNOSIS — M545 Low back pain, unspecified: Secondary | ICD-10-CM

## 2020-01-13 DIAGNOSIS — G8929 Other chronic pain: Secondary | ICD-10-CM

## 2020-01-13 NOTE — Therapy (Signed)
[facility] [address], [place], [place], [address] Phone: [phone]   Fax:  [phone]  Physical Therapy Treatment  Patient Details  Name: Carrie [name] MRN: [id number] Date of Birth: Feb 23, 1946 Referring Provider (PT): Jovita Gamma, MD   Encounter Date: 01/13/2020  PT End of Session - 01/13/20 1443    Visit Number  13    Date for PT Re-Evaluation  02/04/20    Authorization Type  medicare    PT Start Time  [id number]    PT Stop Time  1445    PT Time Calculation (min)  42 min    Activity Tolerance  Patient tolerated treatment well    Behavior During Therapy  [facility] for tasks assessed/performed       Past Medical History:  Diagnosis Date  . BPPV (benign paroxysmal positional vertigo), bilateral 04/11/2019  . Colon polyps   . Esophageal spasm   . Fatigue   . Frequent PVCs   . GERD (gastroesophageal reflux disease)   . IBS (irritable bowel syndrome)   . Palpitations   . Recurrent UTI   . Seborrheic dermatitis of scalp 03/06/13  . Vitamin B 12 deficiency     Past Surgical History:  Procedure Laterality Date  . CHOLECYSTECTOMY N/A 08/23/2016   Procedure: LAPAROSCOPIC CHOLECYSTECTOMY;  Surgeon: Rolm Bookbinder, MD;  Location: [facility];  Service: General;  Laterality: N/A;  . COLECTOMY Right 1998   large colonic polyp  . ENDOSCOPIC VEIN LASER TREATMENT Bilateral 2010-2011  . KNEE ARTHROSCOPY W/ MENISCECTOMY Left   . [place]  2001  . SPINE SURGERY     3 since 11/2010, laminectomy, May 2012 lumbar fusion, diskectomy    There were no vitals filed for this visit.  Subjective Assessment - 01/13/20 1406    Subjective  My blood pressure has been up now.  I was supposed to take a new medication since my BP has been low but I haven't started taking it yet.    Pertinent History  lumbar surgeries: 11/2010 and 01/2011 discectomy surgeries.  L4-5 lumbar fusion 2012.  Lt knee surgery: 12/18/2018    Currently in  Pain?  No/denies         [facility] PT Assessment - 01/13/20 0001      Prior Function   Level of Independence  Independent      Strength   Overall Strength Comments  knee extension 4+/5, flexion 4/5                    [facility] Adult PT Treatment/Exercise - 01/13/20 0001      Lumbar Exercises: Stretches   Active Hamstring Stretch  Right;Left;2 reps;30 seconds    Single Knee to Chest Stretch  Right;Left;3 reps;20 seconds    Lower Trunk Rotation  2 reps;30 seconds    Pelvic Tilt  --   ball & glute cocontraction 3 sec hold 10x   Piriformis Stretch  Right;Left;2 reps;30 seconds    Other Lumbar Stretch Exercise  Seated side stretching with stick overhead 5x bil      Lumbar Exercises: Supine   Clam  --   2x15 red Vc to PPT into heat   Clam Limitations  Red band with VC for core activation    Bridge  --   2x5 with ball squeeze     Knee/Hip Exercises: Stretches   Other Knee/Hip Stretches  Butterfly stretch 2x30 seconds      Manual Therapy   Manual Therapy  Soft  tissue mobilization    Manual therapy comments  Rt gluteals with pt in Rt sidelying               PT Short Term Goals - 01/08/20 1410      PT SHORT TERM GOAL #3   Title  demonstrate heel strike bilaterally with gait on level surface > or = to 50% of the time to reduce falls risk    Time  4    Period  Weeks    Status  Achieved      PT SHORT TERM GOAL #4   Title  improve endurance to walk for 10-15 minutes without significant fatigue to improve community endurance    Time  4    Period  Weeks    Status  Achieved   Walking 60 min in water, 10-15 min land   Target Date  01/07/20        PT Long Term Goals - 01/13/20 1408      PT LONG TERM GOAL #2   Title  demonstrate t to 4+/5 bil hip and knee strength to improve endurance for community activity    Baseline  knee: flexion 4/5, extension 4+/5 bil    Time  8    Period  Weeks    Status  On-going      PT LONG TERM GOAL #4   Title  perform regular  walking for exercise and verbalize understanding of safe progression    Baseline  walking 14 minutes now    Time  8    Period  Weeks    Status  On-going      PT LONG TERM GOAL #5   Title  improve LE strength and endurance to walk for 20-25 minutes without limitation    Baseline  ok to do this in the pool    Time  8    Period  Weeks    Status  On-going      PT LONG TERM GOAL #6   Title  report a 50% reduction in LBP with standing and walking to improve safety and independepence in the community    Status  Achieved            Plan - 01/13/20 1425    Clinical Impression Statement  Pt reports 60% overall improvement in LBP since the start of care.  Pt has had issues with regulation of BP and heart rate so exercise remains low level at this time.  Pt with improved knee strength into extension.  Pt with improved heel strike with gait on level surface and reports that she is able to walk for 14 minutes at one time.  Pt with some Rt gluteal pain that began with new hip ER stretching.  Pt requires close supervision to monitor for symptoms with BP and heart rate.  Pt will continue to benefit from skilled PT to address chronic LBP and endurance deficits.    PT Frequency  2x / week    PT Duration  8 weeks    PT Treatment/Interventions  Patient/family education;ADLs/Self Care Home Management;Moist Heat;Electrical Stimulation;Stair training;Gait training;Functional mobility training;Neuromuscular re-education;Balance training;Therapeutic exercise;Therapeutic activities;Manual techniques;Taping;Dry needling;Passive range of motion    PT Next Visit Plan  se what MD says    PT Home Exercise Plan  Access Code: [facility]    Consulted and Agree with Plan of Care  Patient       Patient will benefit from skilled therapeutic intervention in order to improve the following deficits  and impairments:  Abnormal gait, Decreased activity tolerance, Decreased strength, Impaired flexibility, Difficulty walking,  Decreased mobility, Decreased endurance, Improper body mechanics, Postural dysfunction, Increased muscle spasms  Visit Diagnosis: Muscle weakness (generalized)  Chronic bilateral low back pain without sciatica  Abnormal posture  Other abnormalities of gait and mobility     Problem List Patient Active Problem List   Diagnosis Date Noted  . Gait abnormality 10/14/2019  . Mixed hyperlipidemia 07/31/2019  . Vitamin D deficiency disease 05/01/2019  . Chronic renal disease, stage 3, moderately decreased glomerular filtration rate (GFR) between 30-59 mL/min/1.73 square meter 05/01/2019  . History of adenomatous polyp of colon 07/17/2018  . Bladder prolapse, female, acquired 09/21/2017  . IFG (impaired fasting glucose) 10/21/2016  . Gastro-esophageal reflux disease without esophagitis 10/20/2016  . Idiopathic peripheral neuropathy 04/08/2016  . Osteoarthritis of spine with radiculopathy, lumbar region 11/24/2015  . Osteopenia 05/28/2014  . Essential hypertension 03/05/2014  . Irritable bowel syndrome (IBS) 03/27/2013  . Former smoker 03/06/2013  . Seborrheic dermatitis of scalp 03/06/2013  . Allergic rhinitis 01/03/2012  . Symptomatic PVCs 01/03/2012     Sigurd Sos, PT 01/13/20 2:47 PM  [facility] [address] [place], [place], [address] Phone: [phone]   Fax:  [phone]  Name: Carrie [name] MRN: [id number] Date of Birth: 1946-07-14

## 2020-01-15 DIAGNOSIS — M48062 Spinal stenosis, lumbar region with neurogenic claudication: Secondary | ICD-10-CM | POA: Diagnosis not present

## 2020-01-15 DIAGNOSIS — R030 Elevated blood-pressure reading, without diagnosis of hypertension: Secondary | ICD-10-CM | POA: Diagnosis not present

## 2020-01-15 DIAGNOSIS — M4726 Other spondylosis with radiculopathy, lumbar region: Secondary | ICD-10-CM | POA: Diagnosis not present

## 2020-01-15 DIAGNOSIS — M5136 Other intervertebral disc degeneration, lumbar region: Secondary | ICD-10-CM | POA: Diagnosis not present

## 2020-01-15 DIAGNOSIS — M47816 Spondylosis without myelopathy or radiculopathy, lumbar region: Secondary | ICD-10-CM | POA: Diagnosis not present

## 2020-01-15 DIAGNOSIS — Z981 Arthrodesis status: Secondary | ICD-10-CM | POA: Diagnosis not present

## 2020-01-15 DIAGNOSIS — Z23 Encounter for immunization: Secondary | ICD-10-CM | POA: Diagnosis not present

## 2020-01-16 NOTE — Telephone Encounter (Signed)
  Patient following up. She states that she did not take the Midodrine because her BP came back up. The highest being 143/92. She does have some concerns however with her HR. She states that when she wakes up it is around 109 or 112. Denies any symptoms.

## 2020-01-16 NOTE — Telephone Encounter (Signed)
The patient is calling because her BP has come back up without Midodrine 143/92, but HR is still elevated to 109-112.    With BP and HR now elevated, I suggested that she takes her Metoprolol Tartrate 12.5 mg in the morning and check BP and HR two hours later and let Dr Rayann Heman know of recordings at her visit 2/18.  She verbalized understanding.

## 2020-01-17 ENCOUNTER — Other Ambulatory Visit: Payer: Self-pay

## 2020-01-17 ENCOUNTER — Encounter: Payer: Self-pay | Admitting: Physical Therapy

## 2020-01-17 ENCOUNTER — Ambulatory Visit: Payer: Medicare Other | Admitting: Physical Therapy

## 2020-01-17 DIAGNOSIS — M545 Low back pain: Secondary | ICD-10-CM | POA: Diagnosis not present

## 2020-01-17 DIAGNOSIS — R2689 Other abnormalities of gait and mobility: Secondary | ICD-10-CM | POA: Diagnosis not present

## 2020-01-17 DIAGNOSIS — M6281 Muscle weakness (generalized): Secondary | ICD-10-CM | POA: Diagnosis not present

## 2020-01-17 DIAGNOSIS — G8929 Other chronic pain: Secondary | ICD-10-CM

## 2020-01-17 DIAGNOSIS — R293 Abnormal posture: Secondary | ICD-10-CM | POA: Diagnosis not present

## 2020-01-17 NOTE — Therapy (Signed)
[facility] [address], [place], [place], [address] Phone: [phone]   Fax:  [phone]  Physical Therapy Treatment  Patient Details  Name: Carrie [name] MRN: [id number] Date of Birth: 04-22-1946 Referring Provider (PT): Jovita Gamma, MD   Encounter Date: 01/17/2020  PT End of Session - 01/17/20 1028    Visit Number  14    Authorization Type  medicare    Authorization Time Period  11-05-19 -02-02-20    PT Start Time  1025    PT Stop Time  1105    PT Time Calculation (min)  40 min    Activity Tolerance  Patient tolerated treatment well    Behavior During Therapy  [facility] for tasks assessed/performed       Past Medical History:  Diagnosis Date  . BPPV (benign paroxysmal positional vertigo), bilateral 04/11/2019  . Colon polyps   . Esophageal spasm   . Fatigue   . Frequent PVCs   . GERD (gastroesophageal reflux disease)   . IBS (irritable bowel syndrome)   . Palpitations   . Recurrent UTI   . Seborrheic dermatitis of scalp 03/06/13  . Vitamin B 12 deficiency     Past Surgical History:  Procedure Laterality Date  . CHOLECYSTECTOMY N/A 08/23/2016   Procedure: LAPAROSCOPIC CHOLECYSTECTOMY;  Surgeon: Rolm Bookbinder, MD;  Location: [facility];  Service: General;  Laterality: N/A;  . COLECTOMY Right 1998   large colonic polyp  . ENDOSCOPIC VEIN LASER TREATMENT Bilateral 2010-2011  . KNEE ARTHROSCOPY W/ MENISCECTOMY Left   . [place]  2001  . SPINE SURGERY     3 since 11/2010, laminectomy, May 2012 lumbar fusion, diskectomy    There were no vitals filed for this visit.  Subjective Assessment - 01/17/20 1027    Subjective  I saw MD and he said ok to have more PT. I di dnot do great aftrer my second COVID shot but I feel better today.    Pertinent History  lumbar surgeries: 11/2010 and 01/2011 discectomy surgeries.  L4-5 lumbar fusion 2012.  Lt knee surgery: 12/18/2018    Currently in Pain?  No/denies     Multiple Pain Sites  No                       [facility] Adult PT Treatment/Exercise - 01/17/20 0001      Lumbar Exercises: Stretches   Active Hamstring Stretch  Right;Left;2 reps;30 seconds    Lower Trunk Rotation  2 reps;30 seconds    Pelvic Tilt  10 reps   hold 3 sec     Lumbar Exercises: Supine   Glut Set Limitations  Adductor/TA/Glutes cocontraction 3 sec hold 10x2    Clam  20 reps    Clam Limitations  green band    Bent Knee Raise  --   2x6 2# added to ankles   Dead Bug  --   UE only 2# weights 2x6, VC for core contraction     Manual Therapy   Manual Therapy  Soft tissue mobilization    Manual therapy comments  Rt gluteals with pt in Rt sidelying               PT Short Term Goals - 01/08/20 1410      PT SHORT TERM GOAL #3   Title  demonstrate heel strike bilaterally with gait on level surface > or = to 50% of the time to reduce falls risk  Time  4    Period  Weeks    Status  Achieved      PT SHORT TERM GOAL #4   Title  improve endurance to walk for 10-15 minutes without significant fatigue to improve community endurance    Time  4    Period  Weeks    Status  Achieved   Walking 60 min in water, 10-15 min land   Target Date  01/07/20        PT Long Term Goals - 01/13/20 1408      PT LONG TERM GOAL #2   Title  demonstrate t to 4+/5 bil hip and knee strength to improve endurance for community activity    Baseline  knee: flexion 4/5, extension 4+/5 bil    Time  8    Period  Weeks    Status  On-going      PT LONG TERM GOAL #4   Title  perform regular walking for exercise and verbalize understanding of safe progression    Baseline  walking 14 minutes now    Time  8    Period  Weeks    Status  On-going      PT LONG TERM GOAL #5   Title  improve LE strength and endurance to walk for 20-25 minutes without limitation    Baseline  ok to do this in the pool    Time  8    Period  Weeks    Status  On-going      PT LONG TERM GOAL #6    Title  report a 50% reduction in LBP with standing and walking to improve safety and independepence in the community    Status  Achieved            Plan - 01/17/20 1028    Clinical Impression Statement  Pt saw MD, pleased with progress and ok for her to continue with some more PT. PTA let pt know her POC was good thoughout beginning of March. Pt was able to tolerate some added to resistance to her UE/LE for 1/2 dying bug exercise. This was "very challenging but I can do it " pt reported. No BP or HR issues today but pt was kept supine.    Personal Factors and Comorbidities  Comorbidity 2    Comorbidities  3 lumbar surgeries, Lt knee surgery    Examination-Activity Limitations  Caring for Others;Carry;Squat;Stand;Stairs;Locomotion Level    Examination-Participation Restrictions  Community Activity;Meal Prep;Cleaning;Laundry;Shop    Stability/Clinical Decision Making  Evolving/Moderate complexity    Rehab Potential  Good    PT Frequency  2x / week    PT Duration  8 weeks    PT Treatment/Interventions  Patient/family education;ADLs/Self Care Home Management;Moist Heat;Electrical Stimulation;Stair training;Gait training;Functional mobility training;Neuromuscular re-education;Balance training;Therapeutic exercise;Therapeutic activities;Manual techniques;Taping;Dry needling;Passive range of motion    PT Next Visit Plan  Add leg press and let's consider progressing back to sitting and standing exercises as long as her vitals stay appropriate.    PT Home Exercise Plan  Access Code: [facility]    Consulted and Agree with Plan of Care  Patient       Patient will benefit from skilled therapeutic intervention in order to improve the following deficits and impairments:  Abnormal gait, Decreased activity tolerance, Decreased strength, Impaired flexibility, Difficulty walking, Decreased mobility, Decreased endurance, Improper body mechanics, Postural dysfunction, Increased muscle spasms  Visit  Diagnosis: Muscle weakness (generalized)  Chronic bilateral low back pain without sciatica  Abnormal posture  Other abnormalities of gait and mobility     Problem List Patient Active Problem List   Diagnosis Date Noted  . Gait abnormality 10/14/2019  . Mixed hyperlipidemia 07/31/2019  . Vitamin D deficiency disease 05/01/2019  . Chronic renal disease, stage 3, moderately decreased glomerular filtration rate (GFR) between 30-59 mL/min/1.73 square meter 05/01/2019  . History of adenomatous polyp of colon 07/17/2018  . Bladder prolapse, female, acquired 09/21/2017  . IFG (impaired fasting glucose) 10/21/2016  . Gastro-esophageal reflux disease without esophagitis 10/20/2016  . Idiopathic peripheral neuropathy 04/08/2016  . Osteoarthritis of spine with radiculopathy, lumbar region 11/24/2015  . Osteopenia 05/28/2014  . Essential hypertension 03/05/2014  . Irritable bowel syndrome (IBS) 03/27/2013  . Former smoker 03/06/2013  . Seborrheic dermatitis of scalp 03/06/2013  . Allergic rhinitis 01/03/2012  . Symptomatic PVCs 01/03/2012    Jasyn Mey, PTA 01/17/2020, 11:14 AM  [facility] [address], [place], [place], [address] Phone: [phone]   Fax:  [phone]  Name: Carrie [name] MRN: [id number] Date of Birth: 1946/07/29

## 2020-01-20 ENCOUNTER — Encounter: Payer: Medicare Other | Admitting: Physical Therapy

## 2020-01-22 ENCOUNTER — Other Ambulatory Visit: Payer: Self-pay

## 2020-01-22 ENCOUNTER — Ambulatory Visit: Payer: Medicare Other | Admitting: Physical Therapy

## 2020-01-22 ENCOUNTER — Encounter: Payer: Self-pay | Admitting: Physical Therapy

## 2020-01-22 DIAGNOSIS — M6281 Muscle weakness (generalized): Secondary | ICD-10-CM | POA: Diagnosis not present

## 2020-01-22 DIAGNOSIS — R2689 Other abnormalities of gait and mobility: Secondary | ICD-10-CM

## 2020-01-22 DIAGNOSIS — G8929 Other chronic pain: Secondary | ICD-10-CM

## 2020-01-22 DIAGNOSIS — R293 Abnormal posture: Secondary | ICD-10-CM

## 2020-01-22 DIAGNOSIS — M545 Low back pain: Secondary | ICD-10-CM | POA: Diagnosis not present

## 2020-01-22 NOTE — Therapy (Signed)
[facility] [address] [place], [place], [address] Phone: [phone]   Fax:  [phone]  Physical Therapy Treatment  Patient Details  Name: Carrie [name] MRN: [id number] Date of Birth: 16-Apr-1946 Referring Provider (PT): Jovita Gamma, MD   Encounter Date: 01/22/2020  PT End of Session - 01/22/20 1404    Visit Number  15    Date for PT Re-Evaluation  02/04/20    Authorization Type  medicare    Authorization Time Period  11-05-19 -02-02-20    PT Start Time  1402    PT Stop Time  1445    PT Time Calculation (min)  43 min    Activity Tolerance  Patient tolerated treatment well    Behavior During Therapy  [facility] for tasks assessed/performed       Past Medical History:  Diagnosis Date  . BPPV (benign paroxysmal positional vertigo), bilateral 04/11/2019  . Colon polyps   . Esophageal spasm   . Fatigue   . Frequent PVCs   . GERD (gastroesophageal reflux disease)   . IBS (irritable bowel syndrome)   . Palpitations   . Recurrent UTI   . Seborrheic dermatitis of scalp 03/06/13  . Vitamin B 12 deficiency     Past Surgical History:  Procedure Laterality Date  . CHOLECYSTECTOMY N/A 08/23/2016   Procedure: LAPAROSCOPIC CHOLECYSTECTOMY;  Surgeon: Rolm Bookbinder, MD;  Location: [facility];  Service: General;  Laterality: N/A;  . COLECTOMY Right 1998   large colonic polyp  . ENDOSCOPIC VEIN LASER TREATMENT Bilateral 2010-2011  . KNEE ARTHROSCOPY W/ MENISCECTOMY Left   . [place]  2001  . SPINE SURGERY     3 since 11/2010, laminectomy, May 2012 lumbar fusion, diskectomy    There were no vitals filed for this visit.  Subjective Assessment - 01/22/20 1408    Subjective  I tried a water "aerobics" class at [facility] and it was just too much. She reports being too sore. I feel very stiff today.    Pertinent History  lumbar surgeries: 11/2010 and 01/2011 discectomy surgeries.  L4-5 lumbar fusion 2012.  Lt knee  surgery: 12/18/2018    Currently in Pain?  No/denies   Just stiff   Multiple Pain Sites  No                       [facility] Adult PT Treatment/Exercise - 01/22/20 0001      Lumbar Exercises: Stretches   Active Hamstring Stretch  Right;Left;2 reps;30 seconds      Lumbar Exercises: Machines for Strengthening   Leg Press  Seat 6 Bil LE 50# 2x10      Lumbar Exercises: Standing   Row  Strengthening;Both;15 reps;Theraband    Theraband Level (Row)  Level 2 (Red)    Shoulder Extension  Strengthening;Both;10 reps    Shoulder Extension Limitations  VC to go slow      Lumbar Exercises: Supine   Clam  --   2x15   Clam Limitations  green band   moved to sitting ater coughing fit   Bent Knee Raise  --   2x10 2# added to ankles sitting   Dead Bug  --   UE only 2# weights 2x10  VC for core contraction   Bridge  --   2x8 with ball squeeze   Bridge Limitations  verbal reminder to contract glutes      Knee/Hip Exercises: Seated   Sit to [employer]  1 set;5 reps;without  UE support   on black pad              PT Short Term Goals - 01/08/20 1410      PT SHORT TERM GOAL #3   Title  demonstrate heel strike bilaterally with gait on level surface > or = to 50% of the time to reduce falls risk    Time  4    Period  Weeks    Status  Achieved      PT SHORT TERM GOAL #4   Title  improve endurance to walk for 10-15 minutes without significant fatigue to improve community endurance    Time  4    Period  Weeks    Status  Achieved   Walking 60 min in water, 10-15 min land   Target Date  01/07/20        PT Long Term Goals - 01/13/20 1408      PT LONG TERM GOAL #2   Title  demonstrate t to 4+/5 bil hip and knee strength to improve endurance for community activity    Baseline  knee: flexion 4/5, extension 4+/5 bil    Time  8    Period  Weeks    Status  On-going      PT LONG TERM GOAL #4   Title  perform regular walking for exercise and verbalize understanding of safe  progression    Baseline  walking 14 minutes now    Time  8    Period  Weeks    Status  On-going      PT LONG TERM GOAL #5   Title  improve LE strength and endurance to walk for 20-25 minutes without limitation    Baseline  ok to do this in the pool    Time  8    Period  Weeks    Status  On-going      PT LONG TERM GOAL #6   Title  report a 50% reduction in LBP with standing and walking to improve safety and independepence in the community    Status  Achieved            Plan - 01/22/20 1413    Clinical Impression Statement  Pt doing well overall. Two specific ADLs are still painful: getting dressed in the AM and brushing her teeth. She finds some of the principles we have discussed are helpful for brushing her teeth " I am still working on it." Pt tolerated adding in a few sitting and standing exercises without any negative side effects.    Personal Factors and Comorbidities  Comorbidity 2    Comorbidities  3 lumbar surgeries, Lt knee surgery    Examination-Activity Limitations  Caring for Others;Carry;Squat;Stand;Stairs;Locomotion Level    Examination-Participation Restrictions  Community Activity;Meal Prep;Cleaning;Laundry;Shop    Stability/Clinical Decision Making  Evolving/Moderate complexity    Rehab Potential  Good    PT Frequency  2x / week    PT Duration  8 weeks    PT Treatment/Interventions  Patient/family education;ADLs/Self Care Home Management;Moist Heat;Electrical Stimulation;Stair training;Gait training;Functional mobility training;Neuromuscular re-education;Balance training;Therapeutic exercise;Therapeutic activities;Manual techniques;Taping;Dry needling;Passive range of motion    PT Next Visit Plan  Continue with strengthening , leg press,    PT Home Exercise Plan  Access Code: [facility]    Consulted and Agree with Plan of Care  Patient       Patient will benefit from skilled therapeutic intervention in order to improve the following deficits and impairments:   Abnormal  gait, Decreased activity tolerance, Decreased strength, Impaired flexibility, Difficulty walking, Decreased mobility, Decreased endurance, Improper body mechanics, Postural dysfunction, Increased muscle spasms  Visit Diagnosis: Muscle weakness (generalized)  Chronic bilateral low back pain without sciatica  Abnormal posture  Other abnormalities of gait and mobility     Problem List Patient Active Problem List   Diagnosis Date Noted  . Gait abnormality 10/14/2019  . Mixed hyperlipidemia 07/31/2019  . Vitamin D deficiency disease 05/01/2019  . Chronic renal disease, stage 3, moderately decreased glomerular filtration rate (GFR) between 30-59 mL/min/1.73 square meter 05/01/2019  . History of adenomatous polyp of colon 07/17/2018  . Bladder prolapse, female, acquired 09/21/2017  . IFG (impaired fasting glucose) 10/21/2016  . Gastro-esophageal reflux disease without esophagitis 10/20/2016  . Idiopathic peripheral neuropathy 04/08/2016  . Osteoarthritis of spine with radiculopathy, lumbar region 11/24/2015  . Osteopenia 05/28/2014  . Essential hypertension 03/05/2014  . Irritable bowel syndrome (IBS) 03/27/2013  . Former smoker 03/06/2013  . Seborrheic dermatitis of scalp 03/06/2013  . Allergic rhinitis 01/03/2012  . Symptomatic PVCs 01/03/2012    Nida Manfredi, PTA 01/22/2020, 2:48 PM  [facility] [address], [place], [place], [address] Phone: [phone]   Fax:  [phone]  Name: MARCELL ESCOVEDO MRN: [id number] Date of Birth: 1946/05/30

## 2020-01-23 ENCOUNTER — Ambulatory Visit: Payer: Medicare Other | Admitting: Internal Medicine

## 2020-01-27 ENCOUNTER — Encounter: Payer: Medicare Other | Admitting: Physical Therapy

## 2020-01-27 ENCOUNTER — Ambulatory Visit: Payer: Medicare Other | Admitting: Internal Medicine

## 2020-01-29 ENCOUNTER — Other Ambulatory Visit: Payer: Self-pay

## 2020-01-29 ENCOUNTER — Ambulatory Visit: Payer: Medicare Other

## 2020-01-29 DIAGNOSIS — G8929 Other chronic pain: Secondary | ICD-10-CM | POA: Diagnosis not present

## 2020-01-29 DIAGNOSIS — M6281 Muscle weakness (generalized): Secondary | ICD-10-CM | POA: Diagnosis not present

## 2020-01-29 DIAGNOSIS — R2689 Other abnormalities of gait and mobility: Secondary | ICD-10-CM | POA: Diagnosis not present

## 2020-01-29 DIAGNOSIS — M545 Low back pain: Secondary | ICD-10-CM | POA: Diagnosis not present

## 2020-01-29 DIAGNOSIS — R293 Abnormal posture: Secondary | ICD-10-CM

## 2020-01-29 NOTE — Therapy (Signed)
[facility] [address] [place], [place], [address] Phone: [phone]   Fax:  [phone]  Physical Therapy Treatment  Patient Details  Name: Carrie [name] MRN: [id number] Date of Birth: 1946/09/05 Referring Provider (PT): Jovita Gamma, MD   Encounter Date: 01/29/2020  PT End of Session - 01/29/20 1442    Visit Number  16    Date for PT Re-Evaluation  03/25/20    PT Start Time  [id number]    PT Stop Time  1445    PT Time Calculation (min)  43 min    Activity Tolerance  Patient tolerated treatment well    Behavior During Therapy  [facility] for tasks assessed/performed       Past Medical History:  Diagnosis Date  . BPPV (benign paroxysmal positional vertigo), bilateral 04/11/2019  . Colon polyps   . Esophageal spasm   . Fatigue   . Frequent PVCs   . GERD (gastroesophageal reflux disease)   . IBS (irritable bowel syndrome)   . Palpitations   . Recurrent UTI   . Seborrheic dermatitis of scalp 03/06/13  . Vitamin B 12 deficiency     Past Surgical History:  Procedure Laterality Date  . CHOLECYSTECTOMY N/A 08/23/2016   Procedure: LAPAROSCOPIC CHOLECYSTECTOMY;  Surgeon: Rolm Bookbinder, MD;  Location: [facility];  Service: General;  Laterality: N/A;  . COLECTOMY Right 1998   large colonic polyp  . ENDOSCOPIC VEIN LASER TREATMENT Bilateral 2010-2011  . KNEE ARTHROSCOPY W/ MENISCECTOMY Left   . [place]  2001  . SPINE SURGERY     3 since 11/2010, laminectomy, May 2012 lumbar fusion, diskectomy    There were no vitals filed for this visit.  Subjective Assessment - 01/29/20 1402    Subjective  My back was hurting over the weekend.  Not sure why.  It seems to be slowly getting better.    Pertinent History  lumbar surgeries: 11/2010 and 01/2011 discectomy surgeries.  L4-5 lumbar fusion 2012.  Lt knee surgery: 12/18/2018    Currently in Pain?  Yes    Pain Score  3     Pain Location  Back    Pain Orientation  Lower     Pain Descriptors / Indicators  Sore    Pain Type  Chronic pain    Pain Onset  More than a month ago    Pain Frequency  Constant    Aggravating Factors   standing, morning hours, too much activity    Pain Relieving Factors  stretching, heat, movement         [facility] PT Assessment - 01/29/20 0001      Assessment   Medical Diagnosis  lumbar stenosis with neurogenic claudication    Referring Provider (PT)  Jovita Gamma, MD    Onset Date/Surgical Date  12/09/18      Prior Function   Level of Independence  Independent    Vocation  Retired      Observation/Other Assessments   Focus on Therapeutic Outcomes (FOTO)   51% limitation      Strength   Overall Strength Comments  knee extension 4+/5, flexion 4/5                    [facility] Adult PT Treatment/Exercise - 01/29/20 0001      Lumbar Exercises: Stretches   Active Hamstring Stretch  Right;Left;2 reps;30 seconds      Lumbar Exercises: Aerobic   Nustep  Level 2x 8 minutes- leg only  Lumbar Exercises: Machines for Strengthening   Leg Press  Seat 6 Bil LE 50# 2x10      Lumbar Exercises: Standing   Row  Strengthening;Both;15 reps;Theraband    Theraband Level (Row)  Level 2 (Red)    Shoulder Extension  Strengthening;Both;10 reps    Shoulder Extension Limitations  VC for speed      Knee/Hip Exercises: Standing   Heel Raises  Both;2 sets;10 reps    Rocker Board  3 minutes      Knee/Hip Exercises: Seated   Sit to Sand  1 set;5 reps;without UE support   on black pad              PT Short Term Goals - 01/08/20 1410      PT SHORT TERM GOAL #3   Title  demonstrate heel strike bilaterally with gait on level surface > or = to 50% of the time to reduce falls risk    Time  4    Period  Weeks    Status  Achieved      PT SHORT TERM GOAL #4   Title  improve endurance to walk for 10-15 minutes without significant fatigue to improve community endurance    Time  4    Period  Weeks    Status  Achieved    Walking 60 min in water, 10-15 min land   Target Date  01/07/20        PT Long Term Goals - 01/29/20 1405      PT LONG TERM GOAL #1   Title  be independent in advanced HEP    Time  8    Period  Weeks    Status  On-going    Target Date  03/25/20      PT LONG TERM GOAL #2   Title  demonstrate t to 4+/5 bil hip and knee strength to improve endurance for community activity    Baseline  knee: flexion 4/5, extension 4+/5 bil    Time  8    Period  Weeks    Status  On-going    Target Date  03/25/20      PT LONG TERM GOAL #3   Title  reduce FOTO to < or = to 47% limitation    Baseline  51%    Time  8    Period  Weeks    Status  On-going    Target Date  03/25/20      PT LONG TERM GOAL #4   Title  perform regular walking for exercise and verbalize understanding of safe progression    Baseline  walking 14 minutes now.  Also walking in the pool.    Time  8    Period  Weeks    Status  On-going    Target Date  03/25/20      PT LONG TERM GOAL #5   Title  improve LE strength and endurance to walk for 20-25 minutes without limitation    Baseline  walking 14 minutes on land    Time  8    Period  Weeks    Status  On-going    Target Date  03/25/20      PT LONG TERM GOAL #6   Title  report a 75% reduction in LBP with standing and walking to improve safety and independepence in the community    Time  8    Period  Weeks    Status  Revised    Target Date  03/25/20            Plan - 01/29/20 1421    Clinical Impression Statement  Pt doing well overall. Pt has had slow progress toward goals as she had to scale back her activity due to cardiac complications (high blood pressure and heart rate) associated with activity. Two specific ADLs are still painful: getting dressed in the AM and brushing her teeth.  Pt does report 50-60% overall improvement in LBP with walking and standing.  Pt is now able to walk 14 minutes without rest.  Pt exercises regularly at the pool and gym at  [employer]  FOTO is improved to 51% limitation.  Pt will continue to benefit from skilled PT to address chronic strength and endurance deficits and LBP.    PT Frequency  2x / week    PT Duration  8 weeks    PT Treatment/Interventions  Patient/family education;ADLs/Self Care Home Management;Moist Heat;Electrical Stimulation;Stair training;Gait training;Functional mobility training;Neuromuscular re-education;Balance training;Therapeutic exercise;Therapeutic activities;Manual techniques;Taping;Dry needling;Passive range of motion    PT Next Visit Plan  Continue with strengthening , leg press,    PT Home Exercise Plan  Access Code: [facility]    Recommended Other Services  recert sent [id number]    Consulted and Agree with Plan of Care  Patient       Patient will benefit from skilled therapeutic intervention in order to improve the following deficits and impairments:  Abnormal gait, Decreased activity tolerance, Decreased strength, Impaired flexibility, Difficulty walking, Decreased mobility, Decreased endurance, Improper body mechanics, Postural dysfunction, Increased muscle spasms  Visit Diagnosis: Chronic bilateral low back pain without sciatica - Plan: PT plan of care cert/re-cert  Muscle weakness (generalized) - Plan: PT plan of care cert/re-cert  Abnormal posture - Plan: PT plan of care cert/re-cert  Other abnormalities of gait and mobility - Plan: PT plan of care cert/re-cert     Problem List Patient Active Problem List   Diagnosis Date Noted  . Gait abnormality 10/14/2019  . Mixed hyperlipidemia 07/31/2019  . Vitamin D deficiency disease 05/01/2019  . Chronic renal disease, stage 3, moderately decreased glomerular filtration rate (GFR) between 30-59 mL/min/1.73 square meter 05/01/2019  . History of adenomatous polyp of colon 07/17/2018  . Bladder prolapse, female, acquired 09/21/2017  . IFG (impaired fasting glucose) 10/21/2016  . Gastro-esophageal reflux disease without  esophagitis 10/20/2016  . Idiopathic peripheral neuropathy 04/08/2016  . Osteoarthritis of spine with radiculopathy, lumbar region 11/24/2015  . Osteopenia 05/28/2014  . Essential hypertension 03/05/2014  . Irritable bowel syndrome (IBS) 03/27/2013  . Former smoker 03/06/2013  . Seborrheic dermatitis of scalp 03/06/2013  . Allergic rhinitis 01/03/2012  . Symptomatic PVCs 01/03/2012     Sigurd Sos, PT 01/29/20 2:48 PM  [facility] [address] [place], [place], [address] Phone: [phone]   Fax:  [phone]  Name: ARGUSTA MIKKELSEN MRN: [id number] Date of Birth: 12-13-1945

## 2020-01-30 DIAGNOSIS — L218 Other seborrheic dermatitis: Secondary | ICD-10-CM | POA: Diagnosis not present

## 2020-01-30 DIAGNOSIS — Z85828 Personal history of other malignant neoplasm of skin: Secondary | ICD-10-CM | POA: Diagnosis not present

## 2020-01-30 DIAGNOSIS — I788 Other diseases of capillaries: Secondary | ICD-10-CM | POA: Diagnosis not present

## 2020-02-03 ENCOUNTER — Ambulatory Visit: Payer: Medicare Other | Attending: Neurosurgery | Admitting: Physical Therapy

## 2020-02-03 ENCOUNTER — Ambulatory Visit (INDEPENDENT_AMBULATORY_CARE_PROVIDER_SITE_OTHER): Payer: Medicare Other | Admitting: Internal Medicine

## 2020-02-03 ENCOUNTER — Other Ambulatory Visit: Payer: Self-pay

## 2020-02-03 ENCOUNTER — Encounter: Payer: Self-pay | Admitting: Internal Medicine

## 2020-02-03 ENCOUNTER — Encounter: Payer: Self-pay | Admitting: Physical Therapy

## 2020-02-03 VITALS — BP 136/80 | HR 81 | Ht 63.5 in | Wt 189.8 lb

## 2020-02-03 DIAGNOSIS — G8929 Other chronic pain: Secondary | ICD-10-CM | POA: Diagnosis not present

## 2020-02-03 DIAGNOSIS — E8582 Wild-type transthyretin-related (ATTR) amyloidosis: Secondary | ICD-10-CM

## 2020-02-03 DIAGNOSIS — M545 Low back pain, unspecified: Secondary | ICD-10-CM

## 2020-02-03 DIAGNOSIS — I5022 Chronic systolic (congestive) heart failure: Secondary | ICD-10-CM | POA: Diagnosis not present

## 2020-02-03 DIAGNOSIS — I10 Essential (primary) hypertension: Secondary | ICD-10-CM

## 2020-02-03 DIAGNOSIS — R293 Abnormal posture: Secondary | ICD-10-CM | POA: Insufficient documentation

## 2020-02-03 DIAGNOSIS — I493 Ventricular premature depolarization: Secondary | ICD-10-CM | POA: Diagnosis not present

## 2020-02-03 DIAGNOSIS — R2689 Other abnormalities of gait and mobility: Secondary | ICD-10-CM | POA: Diagnosis not present

## 2020-02-03 DIAGNOSIS — M6281 Muscle weakness (generalized): Secondary | ICD-10-CM | POA: Diagnosis not present

## 2020-02-03 NOTE — Patient Instructions (Addendum)
Medication Instructions:  Your physician recommends that you continue on your current medications as directed. Please refer to the Current Medication list given to you today.  Increase your salt intake  Increase your fluid intake  Labwork: You will get lab work today:  CBC, BMP, TSH, T4 and myeloma profile  Testing/Procedures: Your physician has requested that you have an echocardiogram. Echocardiography is a painless test that uses sound waves to create images of your heart. It provides your doctor with information about the size and shape of your heart and how well your heart's chambers and valves are working. This procedure takes approximately one hour. There are no restrictions for this procedure.  Please schedule for ECHO  Please schedule for a myocardial amyloid scan   Follow-Up: Your physician wants you to follow-up in: 6 weeks with Dr. Rayann Heman.     March 26, 2020 at 2:00 pm at the [facility] office   Any Other Special Instructions Will Be Listed Below (If Applicable).  If you need a refill on your cardiac medications before your next appointment, please call your pharmacy.

## 2020-02-03 NOTE — Therapy (Signed)
[facility] [address], [place], [place], [address] Phone: [phone]   Fax:  [phone]  Physical Therapy Treatment  Patient Details  Name: Carrie [name] MRN: [id number] Date of Birth: 29-Apr-1946 Referring Provider (PT): Jovita Gamma, MD   Encounter Date: 02/03/2020  PT End of Session - 02/03/20 1103    Visit Number  17    Date for PT Re-Evaluation  03/25/20    Authorization Type  medicare    Authorization Time Period  11-05-19 -02-02-20    PT Start Time  1103    PT Stop Time  1141    PT Time Calculation (min)  38 min    Activity Tolerance  Patient limited by pain    Behavior During Therapy  [facility] for tasks assessed/performed       Past Medical History:  Diagnosis Date  . BPPV (benign paroxysmal positional vertigo), bilateral 04/11/2019  . Colon polyps   . Esophageal spasm   . Fatigue   . Frequent PVCs   . GERD (gastroesophageal reflux disease)   . IBS (irritable bowel syndrome)   . Palpitations   . Recurrent UTI   . Seborrheic dermatitis of scalp 03/06/13  . Vitamin B 12 deficiency     Past Surgical History:  Procedure Laterality Date  . CHOLECYSTECTOMY N/A 08/23/2016   Procedure: LAPAROSCOPIC CHOLECYSTECTOMY;  Surgeon: Rolm Bookbinder, MD;  Location: [facility];  Service: General;  Laterality: N/A;  . COLECTOMY Right 1998   large colonic polyp  . ENDOSCOPIC VEIN LASER TREATMENT Bilateral 2010-2011  . KNEE ARTHROSCOPY W/ MENISCECTOMY Left   . [place]  2001  . SPINE SURGERY     3 since 11/2010, laminectomy, May 2012 lumbar fusion, diskectomy    There were no vitals filed for this visit.  Subjective Assessment - 02/03/20 1110    Subjective  I tried walking this weekend; 20 min and after my back really hurt.    Pertinent History  lumbar surgeries: 11/2010 and 01/2011 discectomy surgeries.  L4-5 lumbar fusion 2012.  Lt knee surgery: 12/18/2018    Currently in Pain?  Yes    Pain Score  5     Pain Location  Back    Pain Orientation  Lower;Right    Pain Descriptors / Indicators  Sore    Aggravating Factors   I think I did too much walking??? MAybe    Pain Relieving Factors  Heat, meds    Multiple Pain Sites  No                       [facility] Adult PT Treatment/Exercise - 02/03/20 0001      Lumbar Exercises: Stretches   Active Hamstring Stretch  Right;Left;2 reps;30 seconds    Single Knee to Chest Stretch  Right;Left;2 reps;20 seconds    Lower Trunk Rotation  --   10x slow rock   Other Lumbar Stretch Exercise  RTLE long leg stretch 3 sec hold 5x      Lumbar Exercises: Supine   Clam  10 reps   Green band clam shells with post pelvic tilt   Dead Bug  --   2x6 with post pelvic tilt     Knee/Hip Exercises: Supine   Short Arc Quad Sets  Strengthening;Both;1 set;10 reps    Short Arc Quad Sets Limitations  2#      Moist Heat Therapy   Moist Heat Location  Lumbar Spine   During supine exercises  PT Education - 02/03/20 1148    Education Details  trouble shooted walking outdoors with Pt.    Person(s) Educated  Patient    Methods  Explanation    Comprehension  Verbalized understanding       PT Short Term Goals - 01/08/20 1410      PT SHORT TERM GOAL #3   Title  demonstrate heel strike bilaterally with gait on level surface > or = to 50% of the time to reduce falls risk    Time  4    Period  Weeks    Status  Achieved      PT SHORT TERM GOAL #4   Title  improve endurance to walk for 10-15 minutes without significant fatigue to improve community endurance    Time  4    Period  Weeks    Status  Achieved   Walking 60 min in water, 10-15 min land   Target Date  01/07/20        PT Long Term Goals - 01/29/20 1405      PT LONG TERM GOAL #1   Title  be independent in advanced HEP    Time  8    Period  Weeks    Status  On-going    Target Date  03/25/20      PT LONG TERM GOAL #2   Title  demonstrate t to 4+/5 bil hip and knee  strength to improve endurance for community activity    Baseline  knee: flexion 4/5, extension 4+/5 bil    Time  8    Period  Weeks    Status  On-going    Target Date  03/25/20      PT LONG TERM GOAL #3   Title  reduce FOTO to < or = to 47% limitation    Baseline  51%    Time  8    Period  Weeks    Status  On-going    Target Date  03/25/20      PT LONG TERM GOAL #4   Title  perform regular walking for exercise and verbalize understanding of safe progression    Baseline  walking 14 minutes now.  Also walking in the pool.    Time  8    Period  Weeks    Status  On-going    Target Date  03/25/20      PT LONG TERM GOAL #5   Title  improve LE strength and endurance to walk for 20-25 minutes without limitation    Baseline  walking 14 minutes on land    Time  8    Period  Weeks    Status  On-going    Target Date  03/25/20      PT LONG TERM GOAL #6   Title  report a 75% reduction in LBP with standing and walking to improve safety and independepence in the community    Time  8    Period  Weeks    Status  Revised    Target Date  03/25/20            Plan - 02/03/20 1143    Clinical Impression Statement  Pt attempted walking outdoors this weekend. She walked 2x, once about 20 min and the second time less. She reports later on in the day her back really hurt and presented today with moderate back pain still. She is depressed over this out come as she felt she " would do better."  Carrie [name] and pt trouble shooted her walking situation. Carrie [name] pointed out 20 min on land perhaps was too much and she might try to lessen the time to 10 minutes and then return home to rest/recline a bit to calm down any stenosis effects. She agreed to try this and in the mean time continue walking in the pool. Pt could only tolerate supine, gentle stretches and LE strength today due to her back pain.    Personal Factors and Comorbidities  Comorbidity 2    Comorbidities  3 lumbar surgeries, Lt knee surgery     Examination-Activity Limitations  Caring for Others;Carry;Squat;Stand;Stairs;Locomotion Level    Examination-Participation Restrictions  Community Activity;Meal Prep;Cleaning;Laundry;Shop    Stability/Clinical Decision Making  Evolving/Moderate complexity    Rehab Potential  Good    PT Frequency  2x / week    PT Duration  8 weeks    PT Treatment/Interventions  Patient/family education;ADLs/Self Care Home Management;Moist Heat;Electrical Stimulation;Stair training;Gait training;Functional mobility training;Neuromuscular re-education;Balance training;Therapeutic exercise;Therapeutic activities;Manual techniques;Taping;Dry needling;Passive range of motion    PT Next Visit Plan  Continue with strengthening , leg press,    PT Home Exercise Plan  Access Code: [facility]    Consulted and Agree with Plan of Care  Patient       Patient will benefit from skilled therapeutic intervention in order to improve the following deficits and impairments:  Abnormal gait, Decreased activity tolerance, Decreased strength, Impaired flexibility, Difficulty walking, Decreased mobility, Decreased endurance, Improper body mechanics, Postural dysfunction, Increased muscle spasms  Visit Diagnosis: Chronic bilateral low back pain without sciatica  Muscle weakness (generalized)  Abnormal posture  Other abnormalities of gait and mobility     Problem List Patient Active Problem List   Diagnosis Date Noted  . Gait abnormality 10/14/2019  . Mixed hyperlipidemia 07/31/2019  . Vitamin D deficiency disease 05/01/2019  . Chronic renal disease, stage 3, moderately decreased glomerular filtration rate (GFR) between 30-59 mL/min/1.73 square meter 05/01/2019  . History of adenomatous polyp of colon 07/17/2018  . Bladder prolapse, female, acquired 09/21/2017  . IFG (impaired fasting glucose) 10/21/2016  . Gastro-esophageal reflux disease without esophagitis 10/20/2016  . Idiopathic peripheral neuropathy 04/08/2016  .  Osteoarthritis of spine with radiculopathy, lumbar region 11/24/2015  . Osteopenia 05/28/2014  . Essential hypertension 03/05/2014  . Irritable bowel syndrome (IBS) 03/27/2013  . Former smoker 03/06/2013  . Seborrheic dermatitis of scalp 03/06/2013  . Allergic rhinitis 01/03/2012  . Symptomatic PVCs 01/03/2012    Carrie [name], Carrie [name] 02/03/2020, 11:49 AM  [facility] [address] [place], [place], [address] Phone: [phone]   Fax:  [phone]  Name: Carrie [name] MRN: [id number] Date of Birth: Apr 07, 1946

## 2020-02-03 NOTE — Progress Notes (Signed)
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 female who presents today for routine electrophysiology followup.  Since last being seen in our clinic, the patient reports doing reasonably well. She is having ongoing issues with low BP and fatigue.  This is accompanied but tachycardia with heart rates 110s.  She has tried metoprolol.  She did well initially.  Unfortunately, this has more recently lowered her BP further. Today, she denies symptoms of  chest pain, shortness of breath, presyncope, or syncope. She has chronic venous insufficiency and wears support hose. The patient is otherwise without complaint today.   Past Medical History:  Diagnosis Date  . BPPV (benign paroxysmal positional vertigo), bilateral 04/11/2019  . Colon polyps   . Esophageal spasm   . Fatigue   . Frequent PVCs   . GERD (gastroesophageal reflux disease)   . IBS (irritable bowel syndrome)   . Palpitations   . Recurrent UTI   . Seborrheic dermatitis of scalp 03/06/13  . Vitamin B 12 deficiency    Past Surgical History:  Procedure Laterality Date  . CHOLECYSTECTOMY N/A 08/23/2016   Procedure: LAPAROSCOPIC CHOLECYSTECTOMY;  Surgeon: Rolm Bookbinder, MD;  Location: [facility];  Service: General;  Laterality: N/A;  . COLECTOMY Right 1998   large colonic polyp  . ENDOSCOPIC VEIN LASER TREATMENT Bilateral 2010-2011  . KNEE ARTHROSCOPY W/ MENISCECTOMY Left   . [place]  2001  . SPINE SURGERY     3 since 11/2010, laminectomy, May 2012 lumbar fusion, diskectomy    ROS- all systems are reviewed and negatives except as per HPI above  Current Outpatient Medications  Medication Sig Dispense Refill  . Atorvastatin Calcium (LIPITOR PO) Take 5 mg by mouth daily.    Marland Kitchen azelastine (ASTELIN) 0.1 % nasal spray Place 2 sprays into both nostrils 2 (two) times daily. 30 mL 12  . betamethasone dipropionate [id number] % lotion 1 application topical daily    . Biotin 1000 MCG tablet Take 1,000 mcg by  mouth daily.    . calcium carbonate (TUMS CALCIUM FOR LIFE BONE) 750 MG chewable tablet Chew 1 tablet by mouth daily.     . Cholecalciferol (VITAMIN D3) 2000 units TABS Take 1 tablet by mouth daily.    . Ciclopirox 1 % shampoo Apply 1 application topically daily as needed (itching).     . disopyramide (NORPACE CR) 150 MG 12 hr capsule Take 1 capsule (150 mg total) by mouth daily. 30 capsule 12  . docusate sodium (COLACE) 100 MG capsule Take 100 mg by mouth daily.    . famotidine (PEPCID) 40 MG tablet Take 1 tablet (40 mg total) by mouth daily as needed for heartburn or indigestion. 90 tablet 1  . Hyoscyamine Sulfate SL (LEVSIN/SL) 0.125 MG SUBL Place 1 tablet under the tongue every 6 (six) hours as needed. 120 tablet 0  . metoprolol tartrate (LOPRESSOR) 25 MG tablet Take 25 mg by mouth daily.    . midodrine (PROAMATINE) 2.5 MG tablet Take 1 tablet (2.5 mg total) by mouth 2 (two) times daily. 90 tablet 3  . mometasone (ELOCON) 0.1 % lotion Apply 1 application topically as needed for irritation.    . Multiple Vitamin (MULTIVITAMIN) tablet Take 1 tablet by mouth daily.    . sodium chloride (MURO 128) 2 % ophthalmic solution 1 drop.    Marland Kitchen tretinoin (RETIN-A) 0.025 % cream Apply 1 application topically every other day.  0   No current  facility-administered medications for this visit.    Physical Exam: Vitals:   02/03/20 1521  BP: 136/80  Pulse: 81  SpO2: 98%  Weight: 189 lb 12.8 oz (86.1 kg)  Height: 5' 3.5" (1.613 m)    GEN- The patient is well appearing, alert and oriented x 3 today.   Head- normocephalic, atraumatic Eyes-  Sclera clear, conjunctiva pink Ears- hearing intact Oropharynx- clear Lungs- Clear to ausculation bilaterally, normal work of breathing Heart- Regular rate and rhythm  GI- soft  Extremities- no clubbing, cyanosis, + edema, wearing support hose  Wt Readings from Last 3 Encounters:  02/03/20 189 lb 12.8 oz (86.1 kg)  12/30/19 188 lb 9.6 oz (85.5 kg)  12/26/19  188 lb (85.3 kg)     Assessment and Plan: 1. Sinus tachycardia with hypotension Adequate hydration and salt liberalization advised Will obtain bmet, cbc, TSH/T4, to further evaluate symptoms Also obtain myeloma profile and PYP scan to evaluate for possible amyloidosis as a cause  2. Nonischemic CM Unclear etiology Low risk myoview 03/22/28 reviewed Repeat echo Will obttain myeloma profile and pyp scan to evaluate for possible amyloidosis as the cause Medical therapy is limited by low BP Consider referral to general cardiology pending results of above  3. Palpitations/ PVCs Stable with norpace ecg 12/26/19 reviewed  Follow-up with me virtually in 7-8 weeks  Thompson Grayer MD, [facility] 02/03/2020 3:57 PM

## 2020-02-05 ENCOUNTER — Encounter: Payer: Self-pay | Admitting: Physical Therapy

## 2020-02-05 ENCOUNTER — Ambulatory Visit: Payer: Medicare Other | Admitting: Physical Therapy

## 2020-02-05 ENCOUNTER — Other Ambulatory Visit: Payer: Self-pay

## 2020-02-05 DIAGNOSIS — M6281 Muscle weakness (generalized): Secondary | ICD-10-CM

## 2020-02-05 DIAGNOSIS — R2689 Other abnormalities of gait and mobility: Secondary | ICD-10-CM | POA: Diagnosis not present

## 2020-02-05 DIAGNOSIS — G8929 Other chronic pain: Secondary | ICD-10-CM

## 2020-02-05 DIAGNOSIS — R293 Abnormal posture: Secondary | ICD-10-CM | POA: Diagnosis not present

## 2020-02-05 DIAGNOSIS — M545 Low back pain: Secondary | ICD-10-CM | POA: Diagnosis not present

## 2020-02-05 NOTE — Therapy (Signed)
[facility] [address] [place], [place], [address] Phone: [phone]   Fax:  [phone]  Physical Therapy Treatment  Patient Details  Name: Carrie [name] MRN: [id number] Date of Birth: 1946/06/22 Referring Provider (PT): Jovita Gamma, MD   Encounter Date: 02/05/2020  PT End of Session - 02/05/20 1019    Visit Number  18    Date for PT Re-Evaluation  03/25/20    Authorization Type  medicare    Authorization Time Period  11-05-19 -02-02-20    PT Start Time  1017    PT Stop Time  1100    PT Time Calculation (min)  43 min    Activity Tolerance  Patient tolerated treatment well    Behavior During Therapy  [facility] for tasks assessed/performed       Past Medical History:  Diagnosis Date  . BPPV (benign paroxysmal positional vertigo), bilateral 04/11/2019  . Colon polyps   . Esophageal spasm   . Fatigue   . Frequent PVCs   . GERD (gastroesophageal reflux disease)   . IBS (irritable bowel syndrome)   . Palpitations   . Recurrent UTI   . Seborrheic dermatitis of scalp 03/06/13  . Vitamin B 12 deficiency     Past Surgical History:  Procedure Laterality Date  . CHOLECYSTECTOMY N/A 08/23/2016   Procedure: LAPAROSCOPIC CHOLECYSTECTOMY;  Surgeon: Rolm Bookbinder, MD;  Location: [facility];  Service: General;  Laterality: N/A;  . COLECTOMY Right 1998   large colonic polyp  . ENDOSCOPIC VEIN LASER TREATMENT Bilateral 2010-2011  . KNEE ARTHROSCOPY W/ MENISCECTOMY Left   . [place]  2001  . SPINE SURGERY     3 since 11/2010, laminectomy, May 2012 lumbar fusion, diskectomy    There were no vitals filed for this visit.  Subjective Assessment - 02/05/20 1020    Currently in Pain?  Yes    Pain Score  5     Pain Location  Back    Pain Orientation  Lower;Left    Pain Descriptors / Indicators  Sore                       [facility] Adult PT Treatment/Exercise - 02/05/20 0001      Lumbar Exercises:  Stretches   Active Hamstring Stretch  Right;Left;1 rep;30 seconds   10 ankle pumps at end   Pelvic Tilt  10 reps;5 seconds      Lumbar Exercises: Aerobic   Nustep  Level 2x 5 minutes- leg only      Lumbar Exercises: Machines for Strengthening   Leg Press  Seat 6: Bil 55# 15x, 60# 15x      Lumbar Exercises: Standing   Row  Strengthening;Both;15 reps;Theraband    Theraband Level (Row)  Level 2 (Red)    Shoulder Extension  Strengthening;Both;10 reps;Theraband    Theraband Level (Shoulder Extension)  Level 2 (Red)    Other Standing Lumbar Exercises  Red band anti rotation 10 each side      Lumbar Exercises: Seated   Other Seated Lumbar Exercises  Seated on black balance pad: 2# sholulder to shoulder 2x10 taps.     Other Seated Lumbar Exercises  1# seated dying bug arms 2x10      Lumbar Exercises: Supine   Clam  20 reps    Dead Bug  --   2x10 with post pelvic tilt     Knee/Hip Exercises: Seated   Sit to Sand  2 sets;5 reps;with  UE support      Knee/Hip Exercises: Supine   Short Arc Quad Sets  Strengthening;Both;1 set;10 reps    Short Arc Quad Sets Limitations  3##, VC for core                PT Short Term Goals - 01/08/20 1410      PT SHORT TERM GOAL #3   Title  demonstrate heel strike bilaterally with gait on level surface > or = to 50% of the time to reduce falls risk    Time  4    Period  Weeks    Status  Achieved      PT SHORT TERM GOAL #4   Title  improve endurance to walk for 10-15 minutes without significant fatigue to improve community endurance    Time  4    Period  Weeks    Status  Achieved   Walking 60 min in water, 10-15 min land   Target Date  01/07/20        PT Long Term Goals - 01/29/20 1405      PT LONG TERM GOAL #1   Title  be independent in advanced HEP    Time  8    Period  Weeks    Status  On-going    Target Date  03/25/20      PT LONG TERM GOAL #2   Title  demonstrate t to 4+/5 bil hip and knee strength to improve endurance for  community activity    Baseline  knee: flexion 4/5, extension 4+/5 bil    Time  8    Period  Weeks    Status  On-going    Target Date  03/25/20      PT LONG TERM GOAL #3   Title  reduce FOTO to < or = to 47% limitation    Baseline  51%    Time  8    Period  Weeks    Status  On-going    Target Date  03/25/20      PT LONG TERM GOAL #4   Title  perform regular walking for exercise and verbalize understanding of safe progression    Baseline  walking 14 minutes now.  Also walking in the pool.    Time  8    Period  Weeks    Status  On-going    Target Date  03/25/20      PT LONG TERM GOAL #5   Title  improve LE strength and endurance to walk for 20-25 minutes without limitation    Baseline  walking 14 minutes on land    Time  8    Period  Weeks    Status  On-going    Target Date  03/25/20      PT LONG TERM GOAL #6   Title  report a 75% reduction in LBP with standing and walking to improve safety and independepence in the community    Time  8    Period  Weeks    Status  Revised    Target Date  03/25/20            Plan - 02/05/20 1019    Clinical Impression Statement  Pt has reduced her walking to 10 min, either in her home or around [place]. She still remains with a flared up back today. Pt did tolerate more sitting and standing exercises today without exacerbating her back pain. Most of the exercises were core  and back strength focused.    Personal Factors and Comorbidities  Comorbidity 2    Comorbidities  3 lumbar surgeries, Lt knee surgery    Examination-Activity Limitations  Caring for Others;Carry;Squat;Stand;Stairs;Locomotion Level    Examination-Participation Restrictions  Community Activity;Meal Prep;Cleaning;Laundry;Shop    Stability/Clinical Decision Making  Evolving/Moderate complexity    Rehab Potential  Good    PT Frequency  2x / week    PT Duration  8 weeks    PT Treatment/Interventions  Patient/family education;ADLs/Self Care Home Management;Moist  Heat;Electrical Stimulation;Stair training;Gait training;Functional mobility training;Neuromuscular re-education;Balance training;Therapeutic exercise;Therapeutic activities;Manual techniques;Taping;Dry needling;Passive range of motion    PT Next Visit Plan  Continue with strengthening , leg press,    PT Home Exercise Plan  Access Code: [facility]    Consulted and Agree with Plan of Care  Patient       Patient will benefit from skilled therapeutic intervention in order to improve the following deficits and impairments:  Abnormal gait, Decreased activity tolerance, Decreased strength, Impaired flexibility, Difficulty walking, Decreased mobility, Decreased endurance, Improper body mechanics, Postural dysfunction, Increased muscle spasms  Visit Diagnosis: Chronic bilateral low back pain without sciatica  Muscle weakness (generalized)  Abnormal posture  Other abnormalities of gait and mobility     Problem List Patient Active Problem List   Diagnosis Date Noted  . Gait abnormality 10/14/2019  . Mixed hyperlipidemia 07/31/2019  . Vitamin D deficiency disease 05/01/2019  . Chronic renal disease, stage 3, moderately decreased glomerular filtration rate (GFR) between 30-59 mL/min/1.73 square meter 05/01/2019  . History of adenomatous polyp of colon 07/17/2018  . Bladder prolapse, female, acquired 09/21/2017  . IFG (impaired fasting glucose) 10/21/2016  . Gastro-esophageal reflux disease without esophagitis 10/20/2016  . Idiopathic peripheral neuropathy 04/08/2016  . Osteoarthritis of spine with radiculopathy, lumbar region 11/24/2015  . Osteopenia 05/28/2014  . Essential hypertension 03/05/2014  . Irritable bowel syndrome (IBS) 03/27/2013  . Former smoker 03/06/2013  . Seborrheic dermatitis of scalp 03/06/2013  . Allergic rhinitis 01/03/2012  . Symptomatic PVCs 01/03/2012    Deeandra Jerry, PTA 02/05/2020, 4:59 PM  [facility] [address] [place], [place], [address] Phone: [phone]   Fax:  [phone]  Name: Carrie [name] MRN: [id number] Date of Birth: 1946/03/06

## 2020-02-07 LAB — BASIC METABOLIC PANEL
BUN/Creatinine Ratio: 17 (ref 12–28)
BUN: 20 mg/dL (ref 8–27)
CO2: 25 mmol/L (ref 20–29)
Calcium: 9.5 mg/dL (ref 8.7–10.3)
Chloride: 104 mmol/L (ref 96–106)
Creatinine, Ser: 1.18 mg/dL — ABNORMAL HIGH (ref 0.57–1.00)
GFR calc Af Amer: 53 mL/min/{1.73_m2} — ABNORMAL LOW (ref >59)
GFR calc non Af Amer: 46 mL/min/{1.73_m2} — ABNORMAL LOW (ref >59)
Glucose: 88 mg/dL (ref 65–99)
Potassium: 4.7 mmol/L (ref 3.5–5.2)
Sodium: 141 mmol/L (ref 134–144)

## 2020-02-07 LAB — CBC WITH DIFFERENTIAL/PLATELET
Basophils Absolute: 0 10*3/uL (ref 0.0–0.2)
Basos: 1 %
EOS (ABSOLUTE): 0.3 10*3/uL (ref 0.0–0.4)
Eos: 4 %
Hematocrit: 37.8 % (ref 34.0–46.6)
Hemoglobin: 12.5 g/dL (ref 11.1–15.9)
Immature Grans (Abs): 0 10*3/uL (ref 0.0–0.1)
Immature Granulocytes: 0 %
Lymphocytes Absolute: 2.4 10*3/uL (ref 0.7–3.1)
Lymphs: 29 %
MCH: 29.9 pg (ref 26.6–33.0)
MCHC: 33.1 g/dL (ref 31.5–35.7)
MCV: 90 fL (ref 79–97)
Monocytes Absolute: 0.6 10*3/uL (ref 0.1–0.9)
Monocytes: 8 %
Neutrophils Absolute: 4.8 10*3/uL (ref 1.4–7.0)
Neutrophils: 58 %
Platelets: 270 10*3/uL (ref 150–450)
RBC: 4.18 x10E6/uL (ref 3.77–5.28)
RDW: 12.7 % (ref 11.7–15.4)
WBC: 8.1 10*3/uL (ref 3.4–10.8)

## 2020-02-07 LAB — MULTIPLE MYELOMA PANEL, SERUM
Albumin SerPl Elph-Mcnc: 3.9 g/dL (ref 2.9–4.4)
Albumin/Glob SerPl: 1.4 (ref 0.7–1.7)
Alpha 1: 0.3 g/dL (ref 0.0–0.4)
Alpha2 Glob SerPl Elph-Mcnc: 0.9 g/dL (ref 0.4–1.0)
B-Globulin SerPl Elph-Mcnc: 0.9 g/dL (ref 0.7–1.3)
Gamma Glob SerPl Elph-Mcnc: 0.8 g/dL (ref 0.4–1.8)
Globulin, Total: 2.9 g/dL (ref 2.2–3.9)
IgA/Immunoglobulin A, Serum: 86 mg/dL (ref 64–422)
IgG (Immunoglobin G), Serum: 798 mg/dL (ref 586–1602)
IgM (Immunoglobulin M), Srm: 139 mg/dL (ref 26–217)
Total Protein: 6.8 g/dL (ref 6.0–8.5)

## 2020-02-07 LAB — TSH: TSH: 0.995 u[IU]/mL (ref 0.450–4.500)

## 2020-02-07 LAB — T4, FREE: Free T4: 1.16 ng/dL (ref 0.82–1.77)

## 2020-02-10 ENCOUNTER — Ambulatory Visit: Payer: Medicare Other | Admitting: Physical Therapy

## 2020-02-10 ENCOUNTER — Encounter: Payer: Self-pay | Admitting: Physical Therapy

## 2020-02-10 ENCOUNTER — Other Ambulatory Visit: Payer: Self-pay

## 2020-02-10 DIAGNOSIS — M6281 Muscle weakness (generalized): Secondary | ICD-10-CM | POA: Diagnosis not present

## 2020-02-10 DIAGNOSIS — M545 Low back pain: Secondary | ICD-10-CM | POA: Diagnosis not present

## 2020-02-10 DIAGNOSIS — G8929 Other chronic pain: Secondary | ICD-10-CM

## 2020-02-10 DIAGNOSIS — R2689 Other abnormalities of gait and mobility: Secondary | ICD-10-CM

## 2020-02-10 DIAGNOSIS — R293 Abnormal posture: Secondary | ICD-10-CM | POA: Diagnosis not present

## 2020-02-10 NOTE — Therapy (Signed)
[facility] [address] [place], [place], [address] Phone: [phone]   Fax:  [phone]  Physical Therapy Treatment  Patient Details  Name: Carrie [name] MRN: [id number] Date of Birth: September 05, 1946 Referring Provider (PT): Jovita Gamma, MD   Encounter Date: 02/10/2020  PT End of Session - 02/10/20 1232    Visit Number  19    Date for PT Re-Evaluation  03/25/20    Authorization Type  medicare    Authorization Time Period  11-05-19 -02-02-20    PT Start Time  1230    PT Stop Time  1310    PT Time Calculation (min)  40 min    Activity Tolerance  Patient tolerated treatment well    Behavior During Therapy  [facility] for tasks assessed/performed       Past Medical History:  Diagnosis Date  . BPPV (benign paroxysmal positional vertigo), bilateral 04/11/2019  . Colon polyps   . Esophageal spasm   . Fatigue   . Frequent PVCs   . GERD (gastroesophageal reflux disease)   . IBS (irritable bowel syndrome)   . Palpitations   . Recurrent UTI   . Seborrheic dermatitis of scalp 03/06/13  . Vitamin B 12 deficiency     Past Surgical History:  Procedure Laterality Date  . CHOLECYSTECTOMY N/A 08/23/2016   Procedure: LAPAROSCOPIC CHOLECYSTECTOMY;  Surgeon: Rolm Bookbinder, MD;  Location: [facility];  Service: General;  Laterality: N/A;  . COLECTOMY Right 1998   large colonic polyp  . ENDOSCOPIC VEIN LASER TREATMENT Bilateral 2010-2011  . KNEE ARTHROSCOPY W/ MENISCECTOMY Left   . [place]  2001  . SPINE SURGERY     3 since 11/2010, laminectomy, May 2012 lumbar fusion, diskectomy    There were no vitals filed for this visit.  Subjective Assessment - 02/10/20 1233    Subjective  I had pins and needles all weekend long in my LTLE. I did my pool walking, I walked outside 5 minutes, and did the leg press.    Patient is accompained by:  Family member    Pertinent History  lumbar surgeries: 11/2010 and 01/2011 discectomy  surgeries.  L4-5 lumbar fusion 2012.  Lt knee surgery: 12/18/2018    Currently in Pain?  Yes    Pain Score  6     Pain Location  Back    Pain Orientation  Right;Left    Pain Descriptors / Indicators  Sore    Multiple Pain Sites  No                       [facility] Adult PT Treatment/Exercise - 02/10/20 0001      Lumbar Exercises: Aerobic   Nustep  Level 2x 5 minutes- leg only      Lumbar Exercises: Machines for Strengthening   Leg Press  Seat 6: 60# 15x      Lumbar Exercises: Standing   Row  Strengthening;Both;20 reps;Theraband    Theraband Level (Row)  Level 2 (Red)    Shoulder Extension  Strengthening;Both;20 reps;Theraband    Theraband Level (Shoulder Extension)  Level 1 (Yellow)    Shoulder Extension Limitations  VC for posture and slowing speed    Other Standing Lumbar Exercises  Red band anti rotation 10x2 each side    Other Standing Lumbar Exercises  Mini tramp weight shifting for multifidus activation 1 min bil       Lumbar Exercises: Seated   Long Arc Quad on Chair  Strengthening;Both;2 sets;10 reps;Weights    LAQ on Chair Weights (lbs)  2   sitting on black pad, VC for core activation   LAQ on Chair Limitations  Red band horizontal abduction red 2x10 VC for core     Other Seated Lumbar Exercises  Seated on black balance pad: 2# sholulder to hip 15 taps bil      Other Seated Lumbar Exercises  2# seated dying bug arms 2x10   VC for core activation              PT Short Term Goals - 01/08/20 1410      PT SHORT TERM GOAL #3   Title  demonstrate heel strike bilaterally with gait on level surface > or = to 50% of the time to reduce falls risk    Time  4    Period  Weeks    Status  Achieved      PT SHORT TERM GOAL #4   Title  improve endurance to walk for 10-15 minutes without significant fatigue to improve community endurance    Time  4    Period  Weeks    Status  Achieved   Walking 60 min in water, 10-15 min land   Target Date  01/07/20         PT Long Term Goals - 01/29/20 1405      PT LONG TERM GOAL #1   Title  be independent in advanced HEP    Time  8    Period  Weeks    Status  On-going    Target Date  03/25/20      PT LONG TERM GOAL #2   Title  demonstrate t to 4+/5 bil hip and knee strength to improve endurance for community activity    Baseline  knee: flexion 4/5, extension 4+/5 bil    Time  8    Period  Weeks    Status  On-going    Target Date  03/25/20      PT LONG TERM GOAL #3   Title  reduce FOTO to < or = to 47% limitation    Baseline  51%    Time  8    Period  Weeks    Status  On-going    Target Date  03/25/20      PT LONG TERM GOAL #4   Title  perform regular walking for exercise and verbalize understanding of safe progression    Baseline  walking 14 minutes now.  Also walking in the pool.    Time  8    Period  Weeks    Status  On-going    Target Date  03/25/20      PT LONG TERM GOAL #5   Title  improve LE strength and endurance to walk for 20-25 minutes without limitation    Baseline  walking 14 minutes on land    Time  8    Period  Weeks    Status  On-going    Target Date  03/25/20      PT LONG TERM GOAL #6   Title  report a 75% reduction in LBP with standing and walking to improve safety and independepence in the community    Time  8    Period  Weeks    Status  Revised    Target Date  03/25/20            Plan - 02/10/20 1232    Clinical Impression Statement  Pt frustrated because she feels she is doing more activity/exercise but continues to have intermittent back pain including LE pins and needles.Today pt tolerated all her standing and sitting exercises excellent with only complaints of back and leg fatigue at th eend of the session.    Personal Factors and Comorbidities  Comorbidity 2    Comorbidities  3 lumbar surgeries, Lt knee surgery    Examination-Activity Limitations  Caring for Others;Carry;Squat;Stand;Stairs;Locomotion Level    Examination-Participation  Restrictions  Community Activity;Meal Prep;Cleaning;Laundry;Shop    Stability/Clinical Decision Making  Evolving/Moderate complexity    Rehab Potential  Good    PT Frequency  2x / week    PT Duration  8 weeks    PT Treatment/Interventions  Patient/family education;ADLs/Self Care Home Management;Moist Heat;Electrical Stimulation;Stair training;Gait training;Functional mobility training;Neuromuscular re-education;Balance training;Therapeutic exercise;Therapeutic activities;Manual techniques;Taping;Dry needling;Passive range of motion    PT Next Visit Plan  Continue with strengthening , leg press, 20th visit PN?    PT Home Exercise Plan  Access Code: [facility]    Consulted and Agree with Plan of Care  Patient       Patient will benefit from skilled therapeutic intervention in order to improve the following deficits and impairments:  Abnormal gait, Decreased activity tolerance, Decreased strength, Impaired flexibility, Difficulty walking, Decreased mobility, Decreased endurance, Improper body mechanics, Postural dysfunction, Increased muscle spasms  Visit Diagnosis: Chronic bilateral low back pain without sciatica  Muscle weakness (generalized)  Abnormal posture  Other abnormalities of gait and mobility     Problem List Patient Active Problem List   Diagnosis Date Noted  . Gait abnormality 10/14/2019  . Mixed hyperlipidemia 07/31/2019  . Vitamin D deficiency disease 05/01/2019  . Chronic renal disease, stage 3, moderately decreased glomerular filtration rate (GFR) between 30-59 mL/min/1.73 square meter 05/01/2019  . History of adenomatous polyp of colon 07/17/2018  . Bladder prolapse, female, acquired 09/21/2017  . IFG (impaired fasting glucose) 10/21/2016  . Gastro-esophageal reflux disease without esophagitis 10/20/2016  . Idiopathic peripheral neuropathy 04/08/2016  . Osteoarthritis of spine with radiculopathy, lumbar region 11/24/2015  . Osteopenia 05/28/2014  . Essential  hypertension 03/05/2014  . Irritable bowel syndrome (IBS) 03/27/2013  . Former smoker 03/06/2013  . Seborrheic dermatitis of scalp 03/06/2013  . Allergic rhinitis 01/03/2012  . Symptomatic PVCs 01/03/2012    Vivian Okelley, PTA 02/10/2020, 1:13 PM  [facility] [address], [place], [place], [address] Phone: [phone]   Fax:  [phone]  Name: Carrie [name] MRN: [id number] Date of Birth: 08/03/1946

## 2020-02-12 ENCOUNTER — Ambulatory Visit: Payer: Medicare Other

## 2020-02-12 ENCOUNTER — Other Ambulatory Visit: Payer: Self-pay

## 2020-02-12 DIAGNOSIS — R293 Abnormal posture: Secondary | ICD-10-CM

## 2020-02-12 DIAGNOSIS — R2689 Other abnormalities of gait and mobility: Secondary | ICD-10-CM | POA: Diagnosis not present

## 2020-02-12 DIAGNOSIS — G8929 Other chronic pain: Secondary | ICD-10-CM

## 2020-02-12 DIAGNOSIS — M545 Low back pain, unspecified: Secondary | ICD-10-CM

## 2020-02-12 DIAGNOSIS — M6281 Muscle weakness (generalized): Secondary | ICD-10-CM

## 2020-02-12 NOTE — Therapy (Signed)
[facility] [address], [place], [place], [address] Phone: [phone]   Fax:  [phone]  Physical Therapy Treatment  Patient Details  Name: Carrie [name] MRN: [id number] Date of Birth: 11-04-1946 Referring Provider (PT): Jovita Gamma, MD   Encounter Date: 02/12/2020 Progress Note Reporting Period 01/06/2020 to 02/12/2020  See note below for Objective Data and Assessment of Progress/Goals.       PT End of Session - 02/12/20 1309    Visit Number  20    Date for PT Re-Evaluation  03/25/20    Authorization Type  medicare    PT Start Time  1230    PT Stop Time  1309    PT Time Calculation (min)  39 min    Activity Tolerance  Patient tolerated treatment well    Behavior During Therapy  [facility] for tasks assessed/performed       Past Medical History:  Diagnosis Date  . BPPV (benign paroxysmal positional vertigo), bilateral 04/11/2019  . Colon polyps   . Esophageal spasm   . Fatigue   . Frequent PVCs   . GERD (gastroesophageal reflux disease)   . IBS (irritable bowel syndrome)   . Palpitations   . Recurrent UTI   . Seborrheic dermatitis of scalp 03/06/13  . Vitamin B 12 deficiency     Past Surgical History:  Procedure Laterality Date  . CHOLECYSTECTOMY N/A 08/23/2016   Procedure: LAPAROSCOPIC CHOLECYSTECTOMY;  Surgeon: Rolm Bookbinder, MD;  Location: [facility];  Service: General;  Laterality: N/A;  . COLECTOMY Right 1998   large colonic polyp  . ENDOSCOPIC VEIN LASER TREATMENT Bilateral 2010-2011  . KNEE ARTHROSCOPY W/ MENISCECTOMY Left   . [place]  2001  . SPINE SURGERY     3 since 11/2010, laminectomy, May 2012 lumbar fusion, diskectomy    There were no vitals filed for this visit.  Subjective Assessment - 02/12/20 1232    Subjective  I walked twice yesterday and I didn't have any problems. I didn't do any pool walking.         [facility] PT Assessment - 02/12/20 0001      Assessment    Medical Diagnosis  lumbar stenosis with neurogenic claudication    Referring Provider (PT)  Jovita Gamma, MD    Onset Date/Surgical Date  12/09/18      Prior Function   Level of Independence  Independent    Vocation  Retired      Observation/Other Assessments   Focus on Therapeutic Outcomes (FOTO)   46% limitation      Strength   Overall Strength  Deficits    Strength Assessment Site  Knee;Hip    Right/Left Hip  Left;Right    Right Hip Flexion  4+/5    Left Hip Flexion  4/5    Right/Left Knee  Right;Left    Right Knee Flexion  5/5    Right Knee Extension  5/5    Left Knee Flexion  4+/5    Left Knee Extension  4/5                   [facility] Adult PT Treatment/Exercise - 02/12/20 0001      Lumbar Exercises: Machines for Strengthening   Leg Press  Seat 6: 60# 2x10, 30# Rt and Lt 2x10 each      Lumbar Exercises: Standing   Row  Strengthening;Both;20 reps;Theraband    Theraband Level (Row)  Level 2 (Red)    Shoulder Extension  Strengthening;Both;20 reps;Theraband    Theraband Level (Shoulder Extension)  Level 2 (Red)    Other Standing Lumbar Exercises  Mini tramp weight shifting for multifidus activation 3 ways x1 minute each       Lumbar Exercises: Seated   Long Arc Quad on Chair  Strengthening;Both;2 sets;10 reps;Weights    LAQ on Chair Weights (lbs)  2   sitting on black pad, VC for core activation   Other Seated Lumbar Exercises  Seated on black balance pad: 2# sholulder to hip 15 taps bil      Other Seated Lumbar Exercises  2# seated dying bug arms 2x10   VC for core activation              PT Short Term Goals - 01/08/20 1410      PT SHORT TERM GOAL #3   Title  demonstrate heel strike bilaterally with gait on level surface > or = to 50% of the time to reduce falls risk    Time  4    Period  Weeks    Status  Achieved      PT SHORT TERM GOAL #4   Title  improve endurance to walk for 10-15 minutes without significant fatigue to improve  community endurance    Time  4    Period  Weeks    Status  Achieved   Walking 60 min in water, 10-15 min land   Target Date  01/07/20        PT Long Term Goals - 02/12/20 1233      PT LONG TERM GOAL #3   Title  reduce FOTO to < or = to 47% limitation    Baseline  46% limitation    Status  Achieved      PT LONG TERM GOAL #4   Title  perform regular walking for exercise and verbalize understanding of safe progression    Baseline  walked twice yesterday- 6 minutes and then 7-8 minutes    Time  8    Period  Weeks    Status  On-going      PT LONG TERM GOAL #5   Title  improve LE strength and endurance to walk for 20-25 minutes without limitation    Baseline  15 minutes- LE fatigue    Time  8    Period  Weeks    Status  On-going      PT LONG TERM GOAL #6   Title  report a 75% reduction in LBP with standing and walking to improve safety and independepence in the community    Status  On-going            Plan - 02/12/20 1301    Clinical Impression Statement  Pt reports 60-65% overall improvement in lumbar symptoms since the start of care.  Pt reports intermittent Lt LE tingling especially at night.  Pt is able to walk for 15 minutes and reports fatigue at the end of this time period.  FOTO is improved to 46% limitation (57% at evaluation).  Pt is exercising regularly in the pool and in exercise at workout room at [facility] for strength and endurance gains.  Pt is making steady progress toward goals and will continue to benefit from skilled PT to improve strength, endurance and address LBP and Lt LE pain as needed.    PT Frequency  2x / week    PT Duration  8 weeks    PT Treatment/Interventions  Patient/family education;ADLs/Self Care Home Management;Moist  Heat;Electrical Stimulation;Stair training;Gait training;Functional mobility training;Neuromuscular re-education;Balance training;Therapeutic exercise;Therapeutic activities;Manual techniques;Taping;Dry needling;Passive range  of motion    PT Next Visit Plan  Strength, leg press, endurance    PT Home Exercise Plan  Access Code: [facility]    Consulted and Agree with Plan of Care  Patient       Patient will benefit from skilled therapeutic intervention in order to improve the following deficits and impairments:  Abnormal gait, Decreased activity tolerance, Decreased strength, Impaired flexibility, Difficulty walking, Decreased mobility, Decreased endurance, Improper body mechanics, Postural dysfunction, Increased muscle spasms  Visit Diagnosis: Chronic bilateral low back pain without sciatica  Muscle weakness (generalized)  Abnormal posture  Other abnormalities of gait and mobility     Problem List Patient Active Problem List   Diagnosis Date Noted  . Gait abnormality 10/14/2019  . Mixed hyperlipidemia 07/31/2019  . Vitamin D deficiency disease 05/01/2019  . Chronic renal disease, stage 3, moderately decreased glomerular filtration rate (GFR) between 30-59 mL/min/1.73 square meter 05/01/2019  . History of adenomatous polyp of colon 07/17/2018  . Bladder prolapse, female, acquired 09/21/2017  . IFG (impaired fasting glucose) 10/21/2016  . Gastro-esophageal reflux disease without esophagitis 10/20/2016  . Idiopathic peripheral neuropathy 04/08/2016  . Osteoarthritis of spine with radiculopathy, lumbar region 11/24/2015  . Osteopenia 05/28/2014  . Essential hypertension 03/05/2014  . Irritable bowel syndrome (IBS) 03/27/2013  . Former smoker 03/06/2013  . Seborrheic dermatitis of scalp 03/06/2013  . Allergic rhinitis 01/03/2012  . Symptomatic PVCs 01/03/2012     Sigurd Sos, PT 02/12/20 1:13 PM   [facility] [address], [place], [place], [address] Phone: [phone]   Fax:  [phone]  Name: Carrie [name] MRN: [id number] Date of Birth: February 14, 1946

## 2020-02-17 ENCOUNTER — Other Ambulatory Visit: Payer: Self-pay

## 2020-02-17 ENCOUNTER — Ambulatory Visit: Payer: Medicare Other

## 2020-02-17 DIAGNOSIS — R293 Abnormal posture: Secondary | ICD-10-CM | POA: Diagnosis not present

## 2020-02-17 DIAGNOSIS — R2689 Other abnormalities of gait and mobility: Secondary | ICD-10-CM | POA: Diagnosis not present

## 2020-02-17 DIAGNOSIS — G8929 Other chronic pain: Secondary | ICD-10-CM

## 2020-02-17 DIAGNOSIS — M6281 Muscle weakness (generalized): Secondary | ICD-10-CM

## 2020-02-17 DIAGNOSIS — M545 Low back pain: Secondary | ICD-10-CM | POA: Diagnosis not present

## 2020-02-17 NOTE — Therapy (Signed)
[facility] [address], [place], [place], [address] Phone: [phone]   Fax:  [phone]  Physical Therapy Treatment  Patient Details  Name: Carrie [name] MRN: [id number] Date of Birth: 1946/08/18 Referring Provider (PT): Jovita Gamma, MD   Encounter Date: 02/17/2020  PT End of Session - 02/17/20 1056    Visit Number  21    Date for PT Re-Evaluation  03/25/20    Authorization Type  KX modifier    PT Start Time  1016    PT Stop Time  1100    PT Time Calculation (min)  44 min    Activity Tolerance  Patient tolerated treatment well    Behavior During Therapy  [facility] for tasks assessed/performed       Past Medical History:  Diagnosis Date  . BPPV (benign paroxysmal positional vertigo), bilateral 04/11/2019  . Colon polyps   . Esophageal spasm   . Fatigue   . Frequent PVCs   . GERD (gastroesophageal reflux disease)   . IBS (irritable bowel syndrome)   . Palpitations   . Recurrent UTI   . Seborrheic dermatitis of scalp 03/06/13  . Vitamin B 12 deficiency     Past Surgical History:  Procedure Laterality Date  . CHOLECYSTECTOMY N/A 08/23/2016   Procedure: LAPAROSCOPIC CHOLECYSTECTOMY;  Surgeon: Rolm Bookbinder, MD;  Location: [facility];  Service: General;  Laterality: N/A;  . COLECTOMY Right 1998   large colonic polyp  . ENDOSCOPIC VEIN LASER TREATMENT Bilateral 2010-2011  . KNEE ARTHROSCOPY W/ MENISCECTOMY Left   . [place]  2001  . SPINE SURGERY     3 since 11/2010, laminectomy, May 2012 lumbar fusion, diskectomy    There were no vitals filed for this visit.  Subjective Assessment - 02/17/20 1031    Subjective  My back has really been hurting.  It is wearing me out.    Patient Stated Goals  improve strength, LE endurance and reduce pain    Currently in Pain?  Yes    Pain Score  5     Pain Location  Back    Pain Orientation  Right;Left    Pain Descriptors / Indicators  Sore    Pain Onset   More than a month ago    Pain Frequency  Constant    Aggravating Factors   getting dressed, it just hurts    Pain Relieving Factors  heat, pain meds                       [facility] Adult PT Treatment/Exercise - 02/17/20 0001      Lumbar Exercises: Aerobic   Nustep  Level 2x 5 minutes arms and legs      Lumbar Exercises: Machines for Strengthening   Leg Press  Seat 6: 60# 2x10, 30# Rt and Lt 2x10 each      Lumbar Exercises: Standing   Row  Strengthening;Both;20 reps;Theraband    Theraband Level (Row)  Level 2 (Red)    Shoulder Extension  Strengthening;Both;20 reps;Theraband    Theraband Level (Shoulder Extension)  Level 2 (Red)    Other Standing Lumbar Exercises  Mini tramp weight shifting for multifidus activation 3 ways x1 minute each       Lumbar Exercises: Seated   Long Arc Quad on Chair  Strengthening;Both;2 sets;10 reps;Weights    LAQ on Chair Weights (lbs)  2   sitting on black pad, VC for core activation   Other Seated Lumbar  Exercises  Seated on black balance pad: 2# sholulder to hip 15 taps bil      Other Seated Lumbar Exercises  2# seated dying bug arms 2x10   VC for core activation            PT Education - 02/17/20 1038    Education Details  curable app, dry needling info    Person(s) Educated  Patient    Methods  Explanation;Demonstration;Handout    Comprehension  Verbalized understanding;Returned demonstration       PT Short Term Goals - 01/08/20 1410      PT SHORT TERM GOAL #3   Title  demonstrate heel strike bilaterally with gait on level surface > or = to 50% of the time to reduce falls risk    Time  4    Period  Weeks    Status  Achieved      PT SHORT TERM GOAL #4   Title  improve endurance to walk for 10-15 minutes without significant fatigue to improve community endurance    Time  4    Period  Weeks    Status  Achieved   Walking 60 min in water, 10-15 min land   Target Date  01/07/20        PT Long Term Goals - 02/12/20  1233      PT LONG TERM GOAL #3   Title  reduce FOTO to < or = to 47% limitation    Baseline  46% limitation    Status  Achieved      PT LONG TERM GOAL #4   Title  perform regular walking for exercise and verbalize understanding of safe progression    Baseline  walked twice yesterday- 6 minutes and then 7-8 minutes    Time  8    Period  Weeks    Status  On-going      PT LONG TERM GOAL #5   Title  improve LE strength and endurance to walk for 20-25 minutes without limitation    Baseline  15 minutes- LE fatigue    Time  8    Period  Weeks    Status  On-going      PT LONG TERM GOAL #6   Title  report a 75% reduction in LBP with standing and walking to improve safety and independepence in the community    Status  On-going            Plan - 02/17/20 1036    Clinical Impression Statement  Pt reports 60-65% overall improvement in lumbar symptoms since the start of care.  Pt reports intermittent Lt LE tingling especially at night.  Pt is able to walk for 15 minutes and reports fatigue at the end of this time period.  FOTO is improved to 46% limitation (57% at evaluation).  Pt is exercising regularly in the pool and in exercise at workout room at [facility] for strength and endurance gains.  Pt is making steady progress toward goals and will continue to benefit from skilled PT to improve strength, endurance and address LBP and Lt LE pain as needed.    PT Frequency  2x / week    PT Duration  8 weeks    PT Treatment/Interventions  Patient/family education;ADLs/Self Care Home Management;Moist Heat;Electrical Stimulation;Stair training;Gait training;Functional mobility training;Neuromuscular re-education;Balance training;Therapeutic exercise;Therapeutic activities;Manual techniques;Taping;Dry needling;Passive range of motion    PT Next Visit Plan  Strength, leg press, endurance.  Pt will consider dry needling.  KX modifier needed  PT Home Exercise Plan  Access Code: [facility]    Consulted  and Agree with Plan of Care  Patient       Patient will benefit from skilled therapeutic intervention in order to improve the following deficits and impairments:  Abnormal gait, Decreased activity tolerance, Decreased strength, Impaired flexibility, Difficulty walking, Decreased mobility, Decreased endurance, Improper body mechanics, Postural dysfunction, Increased muscle spasms  Visit Diagnosis: Chronic bilateral low back pain without sciatica  Muscle weakness (generalized)  Abnormal posture  Other abnormalities of gait and mobility     Problem List Patient Active Problem List   Diagnosis Date Noted  . Gait abnormality 10/14/2019  . Mixed hyperlipidemia 07/31/2019  . Vitamin D deficiency disease 05/01/2019  . Chronic renal disease, stage 3, moderately decreased glomerular filtration rate (GFR) between 30-59 mL/min/1.73 square meter 05/01/2019  . History of adenomatous polyp of colon 07/17/2018  . Bladder prolapse, female, acquired 09/21/2017  . IFG (impaired fasting glucose) 10/21/2016  . Gastro-esophageal reflux disease without esophagitis 10/20/2016  . Idiopathic peripheral neuropathy 04/08/2016  . Osteoarthritis of spine with radiculopathy, lumbar region 11/24/2015  . Osteopenia 05/28/2014  . Essential hypertension 03/05/2014  . Irritable bowel syndrome (IBS) 03/27/2013  . Former smoker 03/06/2013  . Seborrheic dermatitis of scalp 03/06/2013  . Allergic rhinitis 01/03/2012  . Symptomatic PVCs 01/03/2012     Sigurd Sos, PT 02/17/20 11:01 AM  [facility] [address] [place], [place], [address] Phone: [phone]   Fax:  [phone]  Name: Carrie [name] MRN: [id number] Date of Birth: February 23, 1946

## 2020-02-17 NOTE — Patient Instructions (Signed)
  Curable app  Trigger Point Dry Needling  . What is Trigger Point Dry Needling (DN)? o DN is a physical therapy technique used to treat muscle pain and dysfunction. Specifically, DN helps deactivate muscle trigger points (muscle knots).  o A thin filiform needle is used to penetrate the skin and stimulate the underlying trigger point. The goal is for a local twitch response (LTR) to occur and for the trigger point to relax. No medication of any kind is injected during the procedure.   . What Does Trigger Point Dry Needling Feel Like?  o The procedure feels different for each individual patient. Some patients report that they do not actually feel the needle enter the skin and overall the process is not painful. Very mild bleeding may occur. However, many patients feel a deep cramping in the muscle in which the needle was inserted. This is the local twitch response.   Marland Kitchen How Will I feel after the treatment? o Soreness is normal, and the onset of soreness may not occur for a few hours. Typically this soreness does not last longer than two days.  o Bruising is uncommon, however; ice can be used to decrease any possible bruising.  o In rare cases feeling tired or nauseous after the treatment is normal. In addition, your symptoms may get worse before they get better, this period will typically not last longer than 24 hours.   . What Can I do After My Treatment? o Increase your hydration by drinking more water for the next 24 hours. o You may place ice or heat on the areas treated that have become sore, however, do not use heat on inflamed or bruised areas. Heat often brings more relief post needling. o You can continue your regular activities, but vigorous activity is not recommended initially after the treatment for 24 hours. o DN is best combined with other physical therapy such as strengthening, stretching, and other therapies.    [facility] [address] [place], [place] [address] Phone # [phone] Fax [phone]

## 2020-02-19 ENCOUNTER — Encounter: Payer: Self-pay | Admitting: Physical Therapy

## 2020-02-19 ENCOUNTER — Other Ambulatory Visit: Payer: Self-pay

## 2020-02-19 ENCOUNTER — Ambulatory Visit: Payer: Medicare Other | Admitting: Physical Therapy

## 2020-02-19 ENCOUNTER — Telehealth (HOSPITAL_COMMUNITY): Payer: Self-pay | Admitting: *Deleted

## 2020-02-19 DIAGNOSIS — M6281 Muscle weakness (generalized): Secondary | ICD-10-CM

## 2020-02-19 DIAGNOSIS — M545 Low back pain: Secondary | ICD-10-CM | POA: Diagnosis not present

## 2020-02-19 DIAGNOSIS — G8929 Other chronic pain: Secondary | ICD-10-CM | POA: Diagnosis not present

## 2020-02-19 DIAGNOSIS — R293 Abnormal posture: Secondary | ICD-10-CM

## 2020-02-19 DIAGNOSIS — R2689 Other abnormalities of gait and mobility: Secondary | ICD-10-CM

## 2020-02-19 NOTE — Therapy (Signed)
[facility] [address] [place], [place], [address] Phone: [phone]   Fax:  [phone]  Physical Therapy Treatment  Patient Details  Name: Carrie [name] MRN: [id number] Date of Birth: 1946-02-20 Referring Provider (PT): Jovita Gamma, MD   Encounter Date: 02/19/2020  PT End of Session - 02/19/20 1101    Visit Number  22    Date for PT Re-Evaluation  03/25/20    Authorization Type  KX modifier    Authorization Time Period  11-05-19 -02-02-20    PT Start Time  1020    PT Stop Time  1100    PT Time Calculation (min)  40 min    Activity Tolerance  Patient tolerated treatment well    Behavior During Therapy  [facility] for tasks assessed/performed       Past Medical History:  Diagnosis Date  . BPPV (benign paroxysmal positional vertigo), bilateral 04/11/2019  . Colon polyps   . Esophageal spasm   . Fatigue   . Frequent PVCs   . GERD (gastroesophageal reflux disease)   . IBS (irritable bowel syndrome)   . Palpitations   . Recurrent UTI   . Seborrheic dermatitis of scalp 03/06/13  . Vitamin B 12 deficiency     Past Surgical History:  Procedure Laterality Date  . CHOLECYSTECTOMY N/A 08/23/2016   Procedure: LAPAROSCOPIC CHOLECYSTECTOMY;  Surgeon: Rolm Bookbinder, MD;  Location: [facility];  Service: General;  Laterality: N/A;  . COLECTOMY Right 1998   large colonic polyp  . ENDOSCOPIC VEIN LASER TREATMENT Bilateral 2010-2011  . KNEE ARTHROSCOPY W/ MENISCECTOMY Left   . [place]  2001  . SPINE SURGERY     3 since 11/2010, laminectomy, May 2012 lumbar fusion, diskectomy    There were no vitals filed for this visit.  Subjective Assessment - 02/19/20 1025    Subjective  My back continues to bother me.I did not take a Tylenol this AM. I would like to try the dry needling.    Pertinent History  lumbar surgeries: 11/2010 and 01/2011 discectomy surgeries.  L4-5 lumbar fusion 2012.  Lt knee surgery: 12/18/2018    Currently in Pain?  Yes    Pain Score  7     Pain Location  Back    Pain Orientation  Left    Pain Descriptors / Indicators  Sore    Multiple Pain Sites  No                       [facility] Adult PT Treatment/Exercise - 02/19/20 0001      Lumbar Exercises: Aerobic   Nustep  Level 2x 6 min      Manual Therapy   Manual Therapy  Soft tissue mobilization    Soft tissue mobilization  Lt proximal gluteals and QL   Pt prone      Trigger Point Dry Needling - 02/19/20 0001    Consent Given?  Yes    Education Handout Provided  Yes    Muscles Treated Back/Hip  Lumbar multifidi;Erector spinae;Gluteus medius;Gluteus minimus    Gluteus Minimus Response  Twitch response elicited;Palpable increased muscle length   Lt only   Gluteus Medius Response  Twitch response elicited;Palpable increased muscle length   Lt only   Erector spinae Response  Twitch response elicited;Palpable increased muscle length    Lumbar multifidi Response  Twitch response elicited;Palpable increased muscle length   Lt only          PT  Education - 02/19/20 1048    Education Details  DN info    Person(s) Educated  Patient    Methods  Explanation;Demonstration;Handout    Comprehension  Verbalized understanding;Returned demonstration       PT Short Term Goals - 01/08/20 1410      PT SHORT TERM GOAL #3   Title  demonstrate heel strike bilaterally with gait on level surface > or = to 50% of the time to reduce falls risk    Time  4    Period  Weeks    Status  Achieved      PT SHORT TERM GOAL #4   Title  improve endurance to walk for 10-15 minutes without significant fatigue to improve community endurance    Time  4    Period  Weeks    Status  Achieved   Walking 60 min in water, 10-15 min land   Target Date  01/07/20        PT Long Term Goals - 02/12/20 1233      PT LONG TERM GOAL #3   Title  reduce FOTO to < or = to 47% limitation    Baseline  46% limitation    Status  Achieved      PT  LONG TERM GOAL #4   Title  perform regular walking for exercise and verbalize understanding of safe progression    Baseline  walked twice yesterday- 6 minutes and then 7-8 minutes    Time  8    Period  Weeks    Status  On-going      PT LONG TERM GOAL #5   Title  improve LE strength and endurance to walk for 20-25 minutes without limitation    Baseline  15 minutes- LE fatigue    Time  8    Period  Weeks    Status  On-going      PT LONG TERM GOAL #6   Title  report a 75% reduction in LBP with standing and walking to improve safety and independepence in the community    Status  On-going            Plan - 02/19/20 1528    Clinical Impression Statement  Pt arrived to PT with same back pain and ready to try some dry needling. Pt received dry needling #1 to her gluteals. pt has been able to continue with her walking outside including small inclines. She does not have pain when she walks, just some time later.       Patient will benefit from skilled therapeutic intervention in order to improve the following deficits and impairments:     Visit Diagnosis: Chronic bilateral low back pain without sciatica  Muscle weakness (generalized)  Abnormal posture  Other abnormalities of gait and mobility     Problem List Patient Active Problem List   Diagnosis Date Noted  . Gait abnormality 10/14/2019  . Mixed hyperlipidemia 07/31/2019  . Vitamin D deficiency disease 05/01/2019  . Chronic renal disease, stage 3, moderately decreased glomerular filtration rate (GFR) between 30-59 mL/min/1.73 square meter 05/01/2019  . History of adenomatous polyp of colon 07/17/2018  . Bladder prolapse, female, acquired 09/21/2017  . IFG (impaired fasting glucose) 10/21/2016  . Gastro-esophageal reflux disease without esophagitis 10/20/2016  . Idiopathic peripheral neuropathy 04/08/2016  . Osteoarthritis of spine with radiculopathy, lumbar region 11/24/2015  . Osteopenia 05/28/2014  . Essential  hypertension 03/05/2014  . Irritable bowel syndrome (IBS) 03/27/2013  . Former smoker 03/06/2013  .  Seborrheic dermatitis of scalp 03/06/2013  . Allergic rhinitis 01/03/2012  . Symptomatic PVCs 01/03/2012    Keyli Duross, PTA 02/19/2020, 3:36 PM  [facility] [address] [place], [place], [address] Phone: [phone]   Fax:  [phone]  Name: Carrie [name] MRN: [id number] Date of Birth: August 25, 1946

## 2020-02-19 NOTE — Patient Instructions (Signed)

## 2020-02-19 NOTE — Telephone Encounter (Signed)
Called patient to remind her of 1:00 Amyloid Study. Patient is aware. Carrie [name]

## 2020-02-24 ENCOUNTER — Other Ambulatory Visit: Payer: Self-pay

## 2020-02-24 ENCOUNTER — Ambulatory Visit (HOSPITAL_COMMUNITY): Payer: Medicare Other | Attending: Cardiology

## 2020-02-24 ENCOUNTER — Ambulatory Visit (HOSPITAL_BASED_OUTPATIENT_CLINIC_OR_DEPARTMENT_OTHER): Payer: Medicare Other

## 2020-02-24 DIAGNOSIS — E8582 Wild-type transthyretin-related (ATTR) amyloidosis: Secondary | ICD-10-CM | POA: Insufficient documentation

## 2020-02-24 DIAGNOSIS — I5022 Chronic systolic (congestive) heart failure: Secondary | ICD-10-CM | POA: Diagnosis not present

## 2020-02-24 MED ORDER — TECHNETIUM TC 99M PYROPHOSPHATE
21.8000 | Freq: Once | INTRAVENOUS | Status: AC
  Administered 2020-02-24: 21.8 via INTRAVENOUS

## 2020-02-26 ENCOUNTER — Ambulatory Visit: Payer: Medicare Other | Admitting: Physical Therapy

## 2020-02-26 ENCOUNTER — Encounter: Payer: Self-pay | Admitting: Physical Therapy

## 2020-02-26 ENCOUNTER — Other Ambulatory Visit: Payer: Self-pay

## 2020-02-26 DIAGNOSIS — M6281 Muscle weakness (generalized): Secondary | ICD-10-CM

## 2020-02-26 DIAGNOSIS — R293 Abnormal posture: Secondary | ICD-10-CM | POA: Diagnosis not present

## 2020-02-26 DIAGNOSIS — G8929 Other chronic pain: Secondary | ICD-10-CM

## 2020-02-26 DIAGNOSIS — M545 Low back pain: Secondary | ICD-10-CM | POA: Diagnosis not present

## 2020-02-26 DIAGNOSIS — R2689 Other abnormalities of gait and mobility: Secondary | ICD-10-CM

## 2020-02-26 NOTE — Therapy (Signed)
[facility] [address] [place], [place], [address] Phone: [phone]   Fax:  [phone]  Physical Therapy Treatment  Patient Details  Name: Carrie [name] MRN: [id number] Date of Birth: [birth year] Referring Provider (PT): Jovita Gamma, MD   Encounter Date: 02/26/2020  PT End of Session - 02/26/20 1025    Visit Number  23    Date for PT Re-Evaluation  03/25/20    Authorization Type  KX modifier    Authorization Time Period  11-05-19 -02-02-20    PT Start Time  1017    PT Stop Time  1055    PT Time Calculation (min)  38 min    Activity Tolerance  Patient tolerated treatment well    Behavior During Therapy  [facility] for tasks assessed/performed       Past Medical History:  Diagnosis Date  . BPPV (benign paroxysmal positional vertigo), bilateral 04/11/2019  . Colon polyps   . Esophageal spasm   . Fatigue   . Frequent PVCs   . GERD (gastroesophageal reflux disease)   . IBS (irritable bowel syndrome)   . Palpitations   . Recurrent UTI   . Seborrheic dermatitis of scalp 03/06/13  . Vitamin B 12 deficiency     Past Surgical History:  Procedure Laterality Date  . CHOLECYSTECTOMY N/A 08/23/2016   Procedure: LAPAROSCOPIC CHOLECYSTECTOMY;  Surgeon: Rolm Bookbinder, MD;  Location: [facility];  Service: General;  Laterality: N/A;  . COLECTOMY Right 1998   large colonic polyp  . ENDOSCOPIC VEIN LASER TREATMENT Bilateral 2010-2011  . KNEE ARTHROSCOPY W/ MENISCECTOMY Left   . [place]  2001  . SPINE SURGERY     3 since 11/2010, laminectomy, May 2012 lumbar fusion, diskectomy    There were no vitals filed for this visit.  Subjective Assessment - 02/26/20 1026    Subjective  I was very sore/in pain after teh dry needling for 2 days. On day 3 it was better but i am not ready to try it again at htis time, maybe at another time.    Pertinent History  lumbar surgeries: 11/2010 and 01/2011 discectomy surgeries.  L4-5  lumbar fusion 2012.  Lt knee surgery: 12/18/2018    Currently in Pain?  Yes    Pain Score  4     Pain Location  Back    Pain Orientation  Left    Pain Descriptors / Indicators  Sore    Multiple Pain Sites  No                       [facility] Adult PT Treatment/Exercise - 02/26/20 0001      Lumbar Exercises: Aerobic   Nustep  Level 2x 7 min      Manual Therapy   Manual Therapy  Soft tissue mobilization    Soft tissue mobilization  RT > LT in LT sidelying gluteals/QL/lateral hip a little on the LT               PT Short Term Goals - 01/08/20 1410      PT SHORT TERM GOAL #3   Title  demonstrate heel strike bilaterally with gait on level surface > or = to 50% of the time to reduce falls risk    Time  4    Period  Weeks    Status  Achieved      PT SHORT TERM GOAL #4   Title  improve endurance to walk for  10-15 minutes without significant fatigue to improve community endurance    Time  4    Period  Weeks    Status  Achieved   Walking 60 min in water, 10-15 min land   Target Date  01/07/20        PT Long Term Goals - 02/12/20 1233      PT LONG TERM GOAL #3   Title  reduce FOTO to < or = to 47% limitation    Baseline  46% limitation    Status  Achieved      PT LONG TERM GOAL #4   Title  perform regular walking for exercise and verbalize understanding of safe progression    Baseline  walked twice yesterday- 6 minutes and then 7-8 minutes    Time  8    Period  Weeks    Status  On-going      PT LONG TERM GOAL #5   Title  improve LE strength and endurance to walk for 20-25 minutes without limitation    Baseline  15 minutes- LE fatigue    Time  8    Period  Weeks    Status  On-going      PT LONG TERM GOAL #6   Title  report a 75% reduction in LBP with standing and walking to improve safety and independepence in the community    Status  On-going            Plan - 02/26/20 1025    Clinical Impression Statement  Pt reports having 2 days of  increased pain after her initial dry needling session. She does report day 3 "was good" and i could stand and put my pants on without pain. pt declined DN #2 today. Instead she opted for manual soft tissue work to her gluteals, lumbar paraspinals and lateral Rt hip. Pt reports increased mobility post session.    Personal Factors and Comorbidities  Comorbidity 2    Comorbidities  3 lumbar surgeries, Lt knee surgery    Examination-Activity Limitations  Caring for Others;Carry;Squat;Stand;Stairs;Locomotion Level    Examination-Participation Restrictions  Community Activity;Meal Prep;Cleaning;Laundry;Shop    Stability/Clinical Decision Making  Evolving/Moderate complexity    Rehab Potential  Good    PT Frequency  2x / week    PT Duration  8 weeks    PT Treatment/Interventions  Patient/family education;ADLs/Self Care Home Management;Moist Heat;Electrical Stimulation;Stair training;Gait training;Functional mobility training;Neuromuscular re-education;Balance training;Therapeutic exercise;Therapeutic activities;Manual techniques;Taping;Dry needling;Passive range of motion    PT Next Visit Plan  Nusstep, soft tissue work, see how pt's walking is going at home,    PT Home Exercise Plan  Access Code: [facility]    Consulted and Agree with Plan of Care  Patient       Patient will benefit from skilled therapeutic intervention in order to improve the following deficits and impairments:  Abnormal gait, Decreased activity tolerance, Decreased strength, Impaired flexibility, Difficulty walking, Decreased mobility, Decreased endurance, Improper body mechanics, Postural dysfunction, Increased muscle spasms  Visit Diagnosis: Chronic bilateral low back pain without sciatica  Muscle weakness (generalized)  Abnormal posture  Other abnormalities of gait and mobility     Problem List Patient Active Problem List   Diagnosis Date Noted  . Gait abnormality 10/14/2019  . Mixed hyperlipidemia 07/31/2019  .  Vitamin D deficiency disease 05/01/2019  . Chronic renal disease, stage 3, moderately decreased glomerular filtration rate (GFR) between 30-59 mL/min/1.73 square meter 05/01/2019  . History of adenomatous polyp of colon 07/17/2018  . Bladder  prolapse, female, acquired 09/21/2017  . IFG (impaired fasting glucose) 10/21/2016  . Gastro-esophageal reflux disease without esophagitis 10/20/2016  . Idiopathic peripheral neuropathy 04/08/2016  . Osteoarthritis of spine with radiculopathy, lumbar region 11/24/2015  . Osteopenia 05/28/2014  . Essential hypertension 03/05/2014  . Irritable bowel syndrome (IBS) 03/27/2013  . Former smoker 03/06/2013  . Seborrheic dermatitis of scalp 03/06/2013  . Allergic rhinitis 01/03/2012  . Symptomatic PVCs 01/03/2012    Adelis Docter, PTA 02/26/2020, 11:04 AM  [facility] [address], [place], [place], [address] Phone: [phone]   Fax:  [phone]  Name: Carrie [name] MRN: [id number] Date of Birth: 22-Oct-1946

## 2020-03-02 ENCOUNTER — Telehealth: Payer: Self-pay

## 2020-03-02 DIAGNOSIS — E8582 Wild-type transthyretin-related (ATTR) amyloidosis: Secondary | ICD-10-CM

## 2020-03-02 DIAGNOSIS — I5022 Chronic systolic (congestive) heart failure: Secondary | ICD-10-CM

## 2020-03-02 NOTE — Telephone Encounter (Signed)
-----   Message from Thompson Grayer, MD sent at 03/01/2020  1:35 PM EDT ----- Lets order cardiac MRI to further evaluate. Refer to general cardiology to see given newly depressed EF. ----- Message ----- From: Jolaine Artist, MD Sent: 02/28/2020   1:40 AM EDT To: Thompson Grayer, MD  Agree this one is equivocal and these can be really frustrating. Study not great quality   If there is suspicion for amyloid and creatinine is 1.5 or less (and no other contraindication) I always start with cMRI. If thats negative you are done. If positive PYP can be confirmatory,.   Happy to get involved if you need.

## 2020-03-02 NOTE — Telephone Encounter (Signed)
Call placed to Pt.  Spoke with Pt's husband.  Advised per Dr. Rayann Heman he would like for Pt to get a cardiac MRI.  Order placed.  Pt has appt on Monday to discuss all test results and recommendations.

## 2020-03-04 ENCOUNTER — Other Ambulatory Visit: Payer: Self-pay

## 2020-03-04 ENCOUNTER — Encounter: Payer: Self-pay | Admitting: Physical Therapy

## 2020-03-04 ENCOUNTER — Ambulatory Visit: Payer: Medicare Other | Admitting: Physical Therapy

## 2020-03-04 DIAGNOSIS — G8929 Other chronic pain: Secondary | ICD-10-CM

## 2020-03-04 DIAGNOSIS — R293 Abnormal posture: Secondary | ICD-10-CM

## 2020-03-04 DIAGNOSIS — M545 Low back pain: Secondary | ICD-10-CM | POA: Diagnosis not present

## 2020-03-04 DIAGNOSIS — R2689 Other abnormalities of gait and mobility: Secondary | ICD-10-CM

## 2020-03-04 DIAGNOSIS — M6281 Muscle weakness (generalized): Secondary | ICD-10-CM | POA: Diagnosis not present

## 2020-03-04 NOTE — Therapy (Signed)
[facility] [address], [place], [place], [address] Phone: [phone]   Fax:  [phone]  Physical Therapy Treatment  Patient Details  Name: Carrie [name] MRN: [id number] Date of Birth: 1946/09/03 Referring Provider (PT): Jovita Gamma, MD   Encounter Date: 03/04/2020  PT End of Session - 03/04/20 1145    Visit Number  24    Date for PT Re-Evaluation  03/25/20    Authorization Type  KX modifier    Authorization Time Period  11-05-19 -02-02-20    PT Start Time  1145    PT Stop Time  1230    PT Time Calculation (min)  45 min    Activity Tolerance  Patient tolerated treatment well    Behavior During Therapy  [facility] for tasks assessed/performed       Past Medical History:  Diagnosis Date  . BPPV (benign paroxysmal positional vertigo), bilateral 04/11/2019  . Colon polyps   . Esophageal spasm   . Fatigue   . Frequent PVCs   . GERD (gastroesophageal reflux disease)   . IBS (irritable bowel syndrome)   . Palpitations   . Recurrent UTI   . Seborrheic dermatitis of scalp 03/06/13  . Vitamin B 12 deficiency     Past Surgical History:  Procedure Laterality Date  . CHOLECYSTECTOMY N/A 08/23/2016   Procedure: LAPAROSCOPIC CHOLECYSTECTOMY;  Surgeon: Rolm Bookbinder, MD;  Location: [facility];  Service: General;  Laterality: N/A;  . COLECTOMY Right 1998   large colonic polyp  . ENDOSCOPIC VEIN LASER TREATMENT Bilateral 2010-2011  . KNEE ARTHROSCOPY W/ MENISCECTOMY Left   . [place]  2001  . SPINE SURGERY     3 since 11/2010, laminectomy, May 2012 lumbar fusion, diskectomy    There were no vitals filed for this visit.  Subjective Assessment - 03/04/20 1151    Subjective  Awaiting cardiac MRI to check EF. Low back pain is improving. The soft tissue work is helping per pt.    Pertinent History  lumbar surgeries: 11/2010 and 01/2011 discectomy surgeries.  L4-5 lumbar fusion 2012.  Lt knee surgery: 12/18/2018    Currently in Pain?  Yes    Pain Score  3     Pain Location  Back    Pain Orientation  Left    Pain Descriptors / Indicators  Sore    Aggravating Factors   Over doing it    Pain Relieving Factors  massage, heat    Multiple Pain Sites  No                       [facility] Adult PT Treatment/Exercise - 03/04/20 0001      Manual Therapy   Manual Therapy  Soft tissue mobilization    Soft tissue mobilization  Lt > Rt in LT sidelying gluteals/QL/lateral hip a little on the LT   Applied Biofreeze at the end, then 10 min of MHP              PT Short Term Goals - 01/08/20 1410      PT SHORT TERM GOAL #3   Title  demonstrate heel strike bilaterally with gait on level surface > or = to 50% of the time to reduce falls risk    Time  4    Period  Weeks    Status  Achieved      PT SHORT TERM GOAL #4   Title  improve endurance to walk for 10-15 minutes  without significant fatigue to improve community endurance    Time  4    Period  Weeks    Status  Achieved   Walking 60 min in water, 10-15 min land   Target Date  01/07/20        PT Long Term Goals - 02/12/20 1233      PT LONG TERM GOAL #3   Title  reduce FOTO to < or = to 47% limitation    Baseline  46% limitation    Status  Achieved      PT LONG TERM GOAL #4   Title  perform regular walking for exercise and verbalize understanding of safe progression    Baseline  walked twice yesterday- 6 minutes and then 7-8 minutes    Time  8    Period  Weeks    Status  On-going      PT LONG TERM GOAL #5   Title  improve LE strength and endurance to walk for 20-25 minutes without limitation    Baseline  15 minutes- LE fatigue    Time  8    Period  Weeks    Status  On-going      PT LONG TERM GOAL #6   Title  report a 75% reduction in LBP with standing and walking to improve safety and independepence in the community    Status  On-going            Plan - 03/04/20 1145    Clinical Impression Statement  Pt's low  back pain is improving per report today.She feels the manual soft tissue work has helped "loosen" her low back and gluteals. She has less pain when she does her exercises at home. She is currently awaitng a cardiac MRI secondary to a significant decreased ejection fraction of 30%-35%. She has many upcoming cardiac consulatations to discuss her many questions including if she has any exercise restrictions besides listening to her body which is what she currently uses. LT proximal gluteals much improved soft tissiue mobility today.    Personal Factors and Comorbidities  Comorbidity 2    Comorbidities  3 lumbar surgeries, Lt knee surgery    Examination-Activity Limitations  Caring for Others;Carry;Squat;Stand;Stairs;Locomotion Level    Examination-Participation Restrictions  Community Activity;Meal Prep;Cleaning;Laundry;Shop    Stability/Clinical Decision Making  Evolving/Moderate complexity    Rehab Potential  Good    PT Frequency  2x / week    PT Duration  8 weeks    PT Treatment/Interventions  Patient/family education;ADLs/Self Care Home Management;Moist Heat;Electrical Stimulation;Stair training;Gait training;Functional mobility training;Neuromuscular re-education;Balance training;Therapeutic exercise;Therapeutic activities;Manual techniques;Taping;Dry needling;Passive range of motion    PT Next Visit Plan  See about cardiac MRI. If still awating that continue with soft tissue work to her glutals, low back and hips.    PT Home Exercise Plan  Access Code: [facility]    Consulted and Agree with Plan of Care  Patient       Patient will benefit from skilled therapeutic intervention in order to improve the following deficits and impairments:  Abnormal gait, Decreased activity tolerance, Decreased strength, Impaired flexibility, Difficulty walking, Decreased mobility, Decreased endurance, Improper body mechanics, Postural dysfunction, Increased muscle spasms  Visit Diagnosis: Chronic bilateral low back  pain without sciatica  Muscle weakness (generalized)  Abnormal posture  Other abnormalities of gait and mobility     Problem List Patient Active Problem List   Diagnosis Date Noted  . Gait abnormality 10/14/2019  . Mixed hyperlipidemia 07/31/2019  . Vitamin D  deficiency disease 05/01/2019  . Chronic renal disease, stage 3, moderately decreased glomerular filtration rate (GFR) between 30-59 mL/min/1.73 square meter 05/01/2019  . History of adenomatous polyp of colon 07/17/2018  . Bladder prolapse, female, acquired 09/21/2017  . IFG (impaired fasting glucose) 10/21/2016  . Gastro-esophageal reflux disease without esophagitis 10/20/2016  . Idiopathic peripheral neuropathy 04/08/2016  . Osteoarthritis of spine with radiculopathy, lumbar region 11/24/2015  . Osteopenia 05/28/2014  . Essential hypertension 03/05/2014  . Irritable bowel syndrome (IBS) 03/27/2013  . Former smoker 03/06/2013  . Seborrheic dermatitis of scalp 03/06/2013  . Allergic rhinitis 01/03/2012  . Symptomatic PVCs 01/03/2012    Jerolene Kupfer,PTA 03/04/2020, 12:23 PM  [facility] [address] [place], [place], [address] Phone: [phone]   Fax:  [phone]  Name: Carrie [name] MRN: [id number] Date of Birth: 1946-06-19

## 2020-03-09 ENCOUNTER — Other Ambulatory Visit: Payer: Self-pay

## 2020-03-09 ENCOUNTER — Encounter: Payer: Self-pay | Admitting: Internal Medicine

## 2020-03-09 ENCOUNTER — Telehealth (INDEPENDENT_AMBULATORY_CARE_PROVIDER_SITE_OTHER): Payer: Medicare Other | Admitting: Internal Medicine

## 2020-03-09 ENCOUNTER — Ambulatory Visit: Payer: Medicare Other | Attending: Neurosurgery

## 2020-03-09 ENCOUNTER — Telehealth: Payer: Self-pay

## 2020-03-09 VITALS — BP 110/78 | HR 102 | Ht 63.5 in | Wt 184.0 lb

## 2020-03-09 DIAGNOSIS — R0602 Shortness of breath: Secondary | ICD-10-CM

## 2020-03-09 DIAGNOSIS — M545 Low back pain, unspecified: Secondary | ICD-10-CM

## 2020-03-09 DIAGNOSIS — I5022 Chronic systolic (congestive) heart failure: Secondary | ICD-10-CM | POA: Diagnosis not present

## 2020-03-09 DIAGNOSIS — G8929 Other chronic pain: Secondary | ICD-10-CM | POA: Diagnosis not present

## 2020-03-09 DIAGNOSIS — R293 Abnormal posture: Secondary | ICD-10-CM | POA: Diagnosis not present

## 2020-03-09 DIAGNOSIS — M6281 Muscle weakness (generalized): Secondary | ICD-10-CM | POA: Insufficient documentation

## 2020-03-09 DIAGNOSIS — R2689 Other abnormalities of gait and mobility: Secondary | ICD-10-CM | POA: Insufficient documentation

## 2020-03-09 DIAGNOSIS — I493 Ventricular premature depolarization: Secondary | ICD-10-CM

## 2020-03-09 DIAGNOSIS — I429 Cardiomyopathy, unspecified: Secondary | ICD-10-CM

## 2020-03-09 NOTE — Telephone Encounter (Signed)
-----   Message from Thompson Grayer, MD sent at 03/09/2020 12:43 PM EDT ----- Cardiac MRI is pending?  Prior authorization   Please schedule RHC/LHC to evaluate her SOB and newly depressed EF.  She is leaving for the beach tomorrow.  Call her to schedule today if you can.   4 months with me

## 2020-03-09 NOTE — H&P (View-Only) (Signed)
Electrophysiology TeleHealth Note   Due to national recommendations of social distancing due to COVID 19, an audio/video telehealth visit is felt to be most appropriate for this patient at this time.  See MyChart message from today for the patient's consent to telehealth for [facility].  Date:  03/09/2020   ID:  Carrie [name], DOB 16-Mar-1946, MRN [id number]  Location: patient's home  Provider location:  [facility]  Evaluation Performed: Follow-up visit  PCP:  Leamon Arnt, MD   Electrophysiologist:  Dr Rayann Heman  Chief Complaint:  palpitations  History of Present Illness:    Carrie [name] is a 74 y.o. female who presents via telehealth conferencing today.  Since last being seen in our clinic, the patient reports doing very well.  "I feel kind of weak.  Part of it might be my back".  She has been exercising without limitation.  + palpitations,  Mild SOB.  Today, she denies symptoms of chest pain, lower extremity edema, dizziness, presyncope, or syncope.  The patient is otherwise without complaint today.  The patient denies symptoms of fevers, chills, cough, or new SOB worrisome for COVID 19.  Past Medical History:  Diagnosis Date  . BPPV (benign paroxysmal positional vertigo), bilateral 04/11/2019  . Colon polyps   . Esophageal spasm   . Fatigue   . Frequent PVCs   . GERD (gastroesophageal reflux disease)   . IBS (irritable bowel syndrome)   . Palpitations   . Recurrent UTI   . Seborrheic dermatitis of scalp 03/06/13  . Vitamin B 12 deficiency     Past Surgical History:  Procedure Laterality Date  . CHOLECYSTECTOMY N/A 08/23/2016   Procedure: LAPAROSCOPIC CHOLECYSTECTOMY;  Surgeon: Rolm Bookbinder, MD;  Location: [facility];  Service: General;  Laterality: N/A;  . COLECTOMY Right 1998   large colonic polyp  . ENDOSCOPIC VEIN LASER TREATMENT Bilateral 2010-2011  . KNEE ARTHROSCOPY W/ MENISCECTOMY Left   . [place]  2001  . SPINE SURGERY     3 since 11/2010,  laminectomy, May 2012 lumbar fusion, diskectomy    Current Outpatient Medications  Medication Sig Dispense Refill  . Atorvastatin Calcium (LIPITOR PO) Take 5 mg by mouth daily.    Marland Kitchen azelastine (ASTELIN) 0.1 % nasal spray Place 2 sprays into both nostrils 2 (two) times daily. 30 mL 12  . betamethasone dipropionate [id number] % lotion 1 application topical daily    . Biotin 1000 MCG tablet Take 1,000 mcg by mouth daily.    . calcium carbonate (OS-CAL) 1250 (500 Ca) MG chewable tablet Chew 1 tablet by mouth daily.    . calcium carbonate (TUMS CALCIUM FOR LIFE BONE) 750 MG chewable tablet Chew 1 tablet by mouth daily.     . Cholecalciferol (VITAMIN D3) 2000 units TABS Take 1 tablet by mouth daily.    . Ciclopirox 1 % shampoo Apply 1 application topically daily as needed (itching).     . disopyramide (NORPACE CR) 150 MG 12 hr capsule Take 1 capsule (150 mg total) by mouth daily. 30 capsule 12  . docusate sodium (COLACE) 100 MG capsule Take 100 mg by mouth daily.    . famotidine (PEPCID) 40 MG tablet Take 1 tablet (40 mg total) by mouth daily as needed for heartburn or indigestion. 90 tablet 1  . Hyoscyamine Sulfate SL (LEVSIN/SL) 0.125 MG SUBL Place 1 tablet under the tongue every 6 (six) hours as needed. 120 tablet 0  . metoprolol tartrate (LOPRESSOR) 25 MG tablet Take  25 mg by mouth daily.    . midodrine (PROAMATINE) 2.5 MG tablet Take 1 tablet (2.5 mg total) by mouth 2 (two) times daily. 90 tablet 3  . mometasone (ELOCON) 0.1 % lotion Apply 1 application topically as needed for irritation.    . Multiple Vitamin (MULTIVITAMIN) tablet Take 1 tablet by mouth daily.    . sodium chloride (MURO 128) 2 % ophthalmic solution 1 drop.    Marland Kitchen tretinoin (RETIN-A) 0.025 % cream Apply 1 application topically every other day.  0   No current facility-administered medications for this visit.    Allergies:   Phenergan [promethazine hcl], Erythromycin, Erythromycin base, Azithromycin, Epinephrine, and Nitrofurantoin     Social History:  The patient  reports that she quit smoking about 7 years ago. She has never used smokeless tobacco. She reports current alcohol use of about 6.0 standard drinks of alcohol per week. She reports that she does not use drugs.   ROS:  Please see the history of present illness.   All other systems are personally reviewed and negative.    Exam:    Vital Signs:  BP 110/78   Pulse (!) 102   Ht 5' 3.5" (1.613 m)   Wt 184 lb (83.5 kg)   BMI 32.08 kg/m   Well sounding and appearing, alert and conversant, regular work of breathing,  good skin color Eyes- anicteric, neuro- grossly intact, skin- no apparent rash or lesions or cyanosis, mouth- oral mucosa is pink  Labs/Other Tests and Data Reviewed:    Recent Labs: 07/29/2019: Magnesium 2.3 10/24/2019: ALT 18 02/03/2020: BUN 20; Creatinine, Ser 1.18; Hemoglobin 12.5; Platelets 270; Potassium 4.7; Sodium 141; TSH 0.995   Wt Readings from Last 3 Encounters:  03/09/20 184 lb (83.5 kg)  02/24/20 189 lb (85.7 kg)  02/03/20 189 lb 12.8 oz (86.1 kg)     ekg 12/2019 reviewed  Echo and PYP scan discussed at length with patient and her spouse today  ASSESSMENT & PLAN:    1.  Acute systolic dysfunction Newly depressed EF (previously EF 45-50%, now 30%) Will order RHC/LHC to further evaluate PYP scan was equivocal.  Will order cardiac MRI to further assess. She will need initiation of coreg and losartan vs entresto but will need this done carefully given prior hypotension.  Will wait until results of cath/ MRI.  She has consultation with Dr Acie Fredrickson soon for CHF management.  2. PVCs Consider stopping norpace depending on results of above workup  3. Sinus tachycardia/ hypotension Likely secondary to #1 above Workup as above  Follow-up:  Dr Acie Fredrickson scheduled to see in a few weeks I will see in 4 months   Patient Risk:  after full review of this patients clinical status, I feel that they are at moderate risk at this  time.  Today, I have spent 15 minutes with the patient with telehealth technology discussing arrhythmia management .    Army Fossa, MD  03/09/2020 12:32 PM     [address] [place] [address] [phone] (office) [phone] (fax)

## 2020-03-09 NOTE — Therapy (Signed)
[facility] [address] [place], [place], [address] Phone: [phone]   Fax:  [phone]  Physical Therapy Treatment  Patient Details  Name: Carrie [name] MRN: [id number] Date of Birth: 1946/03/31 Referring Provider (PT): Jovita Gamma, MD   Encounter Date: 03/09/2020  PT End of Session - 03/09/20 1010    Visit Number  25    Date for PT Re-Evaluation  03/25/20    Authorization Type  KX modifier    PT Start Time  0935    PT Stop Time  1020    PT Time Calculation (min)  45 min    Activity Tolerance  Patient tolerated treatment well    Behavior During Therapy  [facility] for tasks assessed/performed       Past Medical History:  Diagnosis Date  . BPPV (benign paroxysmal positional vertigo), bilateral 04/11/2019  . Colon polyps   . Esophageal spasm   . Fatigue   . Frequent PVCs   . GERD (gastroesophageal reflux disease)   . IBS (irritable bowel syndrome)   . Palpitations   . Recurrent UTI   . Seborrheic dermatitis of scalp 03/06/13  . Vitamin B 12 deficiency     Past Surgical History:  Procedure Laterality Date  . CHOLECYSTECTOMY N/A 08/23/2016   Procedure: LAPAROSCOPIC CHOLECYSTECTOMY;  Surgeon: Rolm Bookbinder, MD;  Location: [facility];  Service: General;  Laterality: N/A;  . COLECTOMY Right 1998   large colonic polyp  . ENDOSCOPIC VEIN LASER TREATMENT Bilateral 2010-2011  . KNEE ARTHROSCOPY W/ MENISCECTOMY Left   . [place]  2001  . SPINE SURGERY     3 since 11/2010, laminectomy, May 2012 lumbar fusion, diskectomy    There were no vitals filed for this visit.  Subjective Assessment - 03/09/20 0936    Subjective  I still haven't been scheduled my MRI.  I just run out of steam at home.    Currently in Pain?  Yes    Pain Location  Back    Pain Orientation  Left    Pain Descriptors / Indicators  Sore    Pain Type  Chronic pain    Pain Onset  More than a month ago    Pain Frequency  Constant    Aggravating Factors   standing to take shower, get dressed    Pain Relieving Factors  moving around, massage, pool, heat                       [facility] Adult PT Treatment/Exercise - 03/09/20 0001      Moist Heat Therapy   Number Minutes Moist Heat  12 Minutes    Moist Heat Location  Lumbar Spine      Manual Therapy   Manual Therapy  Soft tissue mobilization    Manual therapy comments  applied Biofreeze post session    Soft tissue mobilization  Lt > Rt in LT sidelying gluteals/QL/lateral hip a little on the LT   Applied Biofreeze at the end, then 10 min of MHP              PT Short Term Goals - 03/09/20 [id number]      PT SHORT TERM GOAL #2   Title  perform 5x sit to stand in < or = to 11.5 seconds to reduce falls risk    Time  4    Period  Weeks    Status  On-going        PT  Long Term Goals - 02/12/20 1233      PT LONG TERM GOAL #3   Title  reduce FOTO to < or = to 47% limitation    Baseline  46% limitation    Status  Achieved      PT LONG TERM GOAL #4   Title  perform regular walking for exercise and verbalize understanding of safe progression    Baseline  walked twice yesterday- 6 minutes and then 7-8 minutes    Time  8    Period  Weeks    Status  On-going      PT LONG TERM GOAL #5   Title  improve LE strength and endurance to walk for 20-25 minutes without limitation    Baseline  15 minutes- LE fatigue    Time  8    Period  Weeks    Status  On-going      PT LONG TERM GOAL #6   Title  report a 75% reduction in LBP with standing and walking to improve safety and independepence in the community    Status  On-going            Plan - 03/09/20 1011    Clinical Impression Statement  Pt is still awaiting MRI to determine the condition of her heart.  Session focused on manual therapy to address LBP as this has been beneficial.  Pt with improved tension in lumbar spine overall and responds well to manual therapy with reduced pain and tension at  the end of the session. PT will hold on activity awaiting cardiac test results.    PT Frequency  2x / week    PT Duration  8 weeks    PT Treatment/Interventions  Patient/family education;ADLs/Self Care Home Management;Moist Heat;Electrical Stimulation;Stair training;Gait training;Functional mobility training;Neuromuscular re-education;Balance training;Therapeutic exercise;Therapeutic activities;Manual techniques;Taping;Dry needling;Passive range of motion    PT Next Visit Plan  See about cardiac MRI. If still awating that continue with soft tissue work to her glutals, low back and hips.    PT Home Exercise Plan  Access Code: [facility]    Consulted and Agree with Plan of Care  Patient       Patient will benefit from skilled therapeutic intervention in order to improve the following deficits and impairments:  Abnormal gait, Decreased activity tolerance, Decreased strength, Impaired flexibility, Difficulty walking, Decreased mobility, Decreased endurance, Improper body mechanics, Postural dysfunction, Increased muscle spasms  Visit Diagnosis: Muscle weakness (generalized)  Chronic bilateral low back pain without sciatica  Abnormal posture     Problem List Patient Active Problem List   Diagnosis Date Noted  . Gait abnormality 10/14/2019  . Mixed hyperlipidemia 07/31/2019  . Vitamin D deficiency disease 05/01/2019  . Chronic renal disease, stage 3, moderately decreased glomerular filtration rate (GFR) between 30-59 mL/min/1.73 square meter 05/01/2019  . History of adenomatous polyp of colon 07/17/2018  . Bladder prolapse, female, acquired 09/21/2017  . IFG (impaired fasting glucose) 10/21/2016  . Gastro-esophageal reflux disease without esophagitis 10/20/2016  . Idiopathic peripheral neuropathy 04/08/2016  . Osteoarthritis of spine with radiculopathy, lumbar region 11/24/2015  . Osteopenia 05/28/2014  . Essential hypertension 03/05/2014  . Irritable bowel syndrome (IBS) 03/27/2013  .  Former smoker 03/06/2013  . Seborrheic dermatitis of scalp 03/06/2013  . Allergic rhinitis 01/03/2012  . Symptomatic PVCs 01/03/2012     Sigurd Sos, PT 03/09/20 10:12 AM  [facility] [address] [place], [place], [address] Phone: [phone]   Fax:  [phone]  Name: SAMYRAH PELAGIO MRN: [id number] Date of Birth: 1946-08-08

## 2020-03-09 NOTE — Progress Notes (Signed)
Electrophysiology TeleHealth Note   Due to national recommendations of social distancing due to COVID 19, an audio/video telehealth visit is felt to be most appropriate for this patient at this time.  See MyChart message from today for the patient's consent to telehealth for [facility].  Date:  03/09/2020   ID:  Carrie [name], DOB 1946-11-12, MRN [id number]  Location: patient's home  Provider location:  [facility]  Evaluation Performed: Follow-up visit  PCP:  Leamon Arnt, MD   Electrophysiologist:  Dr Rayann Heman  Chief Complaint:  palpitations  History of Present Illness:    Carrie [name] is a 74 y.o. female who presents via telehealth conferencing today.  Since last being seen in our clinic, the patient reports doing very well.  "I feel kind of weak.  Part of it might be my back".  She has been exercising without limitation.  + palpitations,  Mild SOB.  Today, she denies symptoms of chest pain, lower extremity edema, dizziness, presyncope, or syncope.  The patient is otherwise without complaint today.  The patient denies symptoms of fevers, chills, cough, or new SOB worrisome for COVID 19.  Past Medical History:  Diagnosis Date  . BPPV (benign paroxysmal positional vertigo), bilateral 04/11/2019  . Colon polyps   . Esophageal spasm   . Fatigue   . Frequent PVCs   . GERD (gastroesophageal reflux disease)   . IBS (irritable bowel syndrome)   . Palpitations   . Recurrent UTI   . Seborrheic dermatitis of scalp 03/06/13  . Vitamin B 12 deficiency     Past Surgical History:  Procedure Laterality Date  . CHOLECYSTECTOMY N/A 08/23/2016   Procedure: LAPAROSCOPIC CHOLECYSTECTOMY;  Surgeon: Rolm Bookbinder, MD;  Location: [facility];  Service: General;  Laterality: N/A;  . COLECTOMY Right 1998   large colonic polyp  . ENDOSCOPIC VEIN LASER TREATMENT Bilateral 2010-2011  . KNEE ARTHROSCOPY W/ MENISCECTOMY Left   . [place]  2001  . SPINE SURGERY     3 since 11/2010,  laminectomy, May 2012 lumbar fusion, diskectomy    Current Outpatient Medications  Medication Sig Dispense Refill  . Atorvastatin Calcium (LIPITOR PO) Take 5 mg by mouth daily.    Marland Kitchen azelastine (ASTELIN) 0.1 % nasal spray Place 2 sprays into both nostrils 2 (two) times daily. 30 mL 12  . betamethasone dipropionate [id number] % lotion 1 application topical daily    . Biotin 1000 MCG tablet Take 1,000 mcg by mouth daily.    . calcium carbonate (OS-CAL) 1250 (500 Ca) MG chewable tablet Chew 1 tablet by mouth daily.    . calcium carbonate (TUMS CALCIUM FOR LIFE BONE) 750 MG chewable tablet Chew 1 tablet by mouth daily.     . Cholecalciferol (VITAMIN D3) 2000 units TABS Take 1 tablet by mouth daily.    . Ciclopirox 1 % shampoo Apply 1 application topically daily as needed (itching).     . disopyramide (NORPACE CR) 150 MG 12 hr capsule Take 1 capsule (150 mg total) by mouth daily. 30 capsule 12  . docusate sodium (COLACE) 100 MG capsule Take 100 mg by mouth daily.    . famotidine (PEPCID) 40 MG tablet Take 1 tablet (40 mg total) by mouth daily as needed for heartburn or indigestion. 90 tablet 1  . Hyoscyamine Sulfate SL (LEVSIN/SL) 0.125 MG SUBL Place 1 tablet under the tongue every 6 (six) hours as needed. 120 tablet 0  . metoprolol tartrate (LOPRESSOR) 25 MG tablet Take  25 mg by mouth daily.    . midodrine (PROAMATINE) 2.5 MG tablet Take 1 tablet (2.5 mg total) by mouth 2 (two) times daily. 90 tablet 3  . mometasone (ELOCON) 0.1 % lotion Apply 1 application topically as needed for irritation.    . Multiple Vitamin (MULTIVITAMIN) tablet Take 1 tablet by mouth daily.    . sodium chloride (MURO 128) 2 % ophthalmic solution 1 drop.    Marland Kitchen tretinoin (RETIN-A) 0.025 % cream Apply 1 application topically every other day.  0   No current facility-administered medications for this visit.    Allergies:   Phenergan [promethazine hcl], Erythromycin, Erythromycin base, Azithromycin, Epinephrine, and Nitrofurantoin     Social History:  The patient  reports that she quit smoking about 7 years ago. She has never used smokeless tobacco. She reports current alcohol use of about 6.0 standard drinks of alcohol per week. She reports that she does not use drugs.   ROS:  Please see the history of present illness.   All other systems are personally reviewed and negative.    Exam:    Vital Signs:  BP 110/78   Pulse (!) 102   Ht 5' 3.5" (1.613 m)   Wt 184 lb (83.5 kg)   BMI 32.08 kg/m   Well sounding and appearing, alert and conversant, regular work of breathing,  good skin color Eyes- anicteric, neuro- grossly intact, skin- no apparent rash or lesions or cyanosis, mouth- oral mucosa is pink  Labs/Other Tests and Data Reviewed:    Recent Labs: 07/29/2019: Magnesium 2.3 10/24/2019: ALT 18 02/03/2020: BUN 20; Creatinine, Ser 1.18; Hemoglobin 12.5; Platelets 270; Potassium 4.7; Sodium 141; TSH 0.995   Wt Readings from Last 3 Encounters:  03/09/20 184 lb (83.5 kg)  02/24/20 189 lb (85.7 kg)  02/03/20 189 lb 12.8 oz (86.1 kg)     ekg 12/2019 reviewed  Echo and PYP scan discussed at length with patient and her spouse today  ASSESSMENT & PLAN:    1.  Acute systolic dysfunction Newly depressed EF (previously EF 45-50%, now 30%) Will order RHC/LHC to further evaluate PYP scan was equivocal.  Will order cardiac MRI to further assess. She will need initiation of coreg and losartan vs entresto but will need this done carefully given prior hypotension.  Will wait until results of cath/ MRI.  She has consultation with Dr Acie Fredrickson soon for CHF management.  2. PVCs Consider stopping norpace depending on results of above workup  3. Sinus tachycardia/ hypotension Likely secondary to #1 above Workup as above  Follow-up:  Dr Acie Fredrickson scheduled to see in a few weeks I will see in 4 months   Patient Risk:  after full review of this patients clinical status, I feel that they are at moderate risk at this  time.  Today, I have spent 15 minutes with the patient with telehealth technology discussing arrhythmia management .    Army Fossa, MD  03/09/2020 12:32 PM     [place] [address] [phone] (office) [phone] (fax)

## 2020-03-09 NOTE — Telephone Encounter (Signed)
Call returned to Pt.  Pt scheduled for R/L heart cath on March 18, 2020 at 8;30 am with Dr. Irish Lack  Will get labs/covid test on April 12, 21  Verbal instruction given for cath  Letter created and sent via mychart.  Work up complete

## 2020-03-12 ENCOUNTER — Ambulatory Visit (HOSPITAL_COMMUNITY): Payer: Medicare Other

## 2020-03-13 ENCOUNTER — Ambulatory Visit: Payer: Medicare Other | Admitting: Internal Medicine

## 2020-03-16 ENCOUNTER — Other Ambulatory Visit: Payer: Self-pay

## 2020-03-16 ENCOUNTER — Ambulatory Visit: Payer: Medicare Other

## 2020-03-16 ENCOUNTER — Other Ambulatory Visit: Payer: Medicare Other | Admitting: *Deleted

## 2020-03-16 ENCOUNTER — Other Ambulatory Visit (HOSPITAL_COMMUNITY)
Admission: RE | Admit: 2020-03-16 | Discharge: 2020-03-16 | Disposition: A | Discharge status: Home / Self Care | Payer: Medicare Other | Source: Ambulatory Visit | Attending: Interventional Cardiology | Admitting: Interventional Cardiology

## 2020-03-16 DIAGNOSIS — M6281 Muscle weakness (generalized): Secondary | ICD-10-CM

## 2020-03-16 DIAGNOSIS — R293 Abnormal posture: Secondary | ICD-10-CM

## 2020-03-16 DIAGNOSIS — Z20822 Contact with and (suspected) exposure to covid-19: Secondary | ICD-10-CM | POA: Insufficient documentation

## 2020-03-16 DIAGNOSIS — R0602 Shortness of breath: Secondary | ICD-10-CM

## 2020-03-16 DIAGNOSIS — M545 Low back pain, unspecified: Secondary | ICD-10-CM

## 2020-03-16 DIAGNOSIS — I429 Cardiomyopathy, unspecified: Secondary | ICD-10-CM

## 2020-03-16 DIAGNOSIS — G8929 Other chronic pain: Secondary | ICD-10-CM

## 2020-03-16 DIAGNOSIS — Z01812 Encounter for preprocedural laboratory examination: Secondary | ICD-10-CM | POA: Insufficient documentation

## 2020-03-16 DIAGNOSIS — R2689 Other abnormalities of gait and mobility: Secondary | ICD-10-CM | POA: Diagnosis not present

## 2020-03-16 LAB — CBC WITH DIFFERENTIAL/PLATELET
Basophils Absolute: 0 10*3/uL (ref 0.0–0.2)
Basos: 0 %
EOS (ABSOLUTE): 0.2 10*3/uL (ref 0.0–0.4)
Eos: 3 %
Hematocrit: 37.1 % (ref 34.0–46.6)
Hemoglobin: 12.1 g/dL (ref 11.1–15.9)
Lymphocytes Absolute: 1.8 10*3/uL (ref 0.7–3.1)
Lymphs: 27 %
MCH: 28.7 pg (ref 26.6–33.0)
MCHC: 32.6 g/dL (ref 31.5–35.7)
MCV: 88 fL (ref 79–97)
Monocytes Absolute: 0.5 10*3/uL (ref 0.1–0.9)
Monocytes: 8 %
Neutrophils Absolute: 4.1 10*3/uL (ref 1.4–7.0)
Neutrophils: 62 %
Platelets: 241 10*3/uL (ref 150–450)
RBC: 4.21 x10E6/uL (ref 3.77–5.28)
RDW: 13.8 % (ref 11.7–15.4)
WBC: 6.6 10*3/uL (ref 3.4–10.8)

## 2020-03-16 LAB — SARS CORONAVIRUS 2 (TAT 6-24 HRS): SARS Coronavirus 2: NEGATIVE

## 2020-03-16 LAB — BASIC METABOLIC PANEL
BUN/Creatinine Ratio: 18 (ref 12–28)
BUN: 17 mg/dL (ref 8–27)
CO2: 28 mmol/L (ref 20–29)
Calcium: 9.9 mg/dL (ref 8.7–10.3)
Chloride: 104 mmol/L (ref 96–106)
Creatinine, Ser: 0.97 mg/dL (ref 0.57–1.00)
GFR calc Af Amer: 66 mL/min/{1.73_m2} (ref >59)
GFR calc non Af Amer: 57 mL/min/{1.73_m2} — ABNORMAL LOW (ref >59)
Glucose: 117 mg/dL — ABNORMAL HIGH (ref 65–99)
Potassium: 4.4 mmol/L (ref 3.5–5.2)
Sodium: 140 mmol/L (ref 134–144)

## 2020-03-16 NOTE — Therapy (Signed)
[facility] [address], [place], [place], [address] Phone: [phone]   Fax:  [phone]  Physical Therapy Treatment  Patient Details  Name: Carrie [name] MRN: [id number] Date of Birth: 18-Jul-1946 Referring Provider (PT): Jovita Gamma, MD   Encounter Date: 03/16/2020  PT End of Session - 03/16/20 1057    Visit Number  26    Date for PT Re-Evaluation  03/25/20    Authorization Type  KX modifier    PT Start Time  1018    PT Stop Time  1103    PT Time Calculation (min)  45 min    Activity Tolerance  Patient tolerated treatment well    Behavior During Therapy  [facility] for tasks assessed/performed       Past Medical History:  Diagnosis Date  . BPPV (benign paroxysmal positional vertigo), bilateral 04/11/2019  . Colon polyps   . Esophageal spasm   . Fatigue   . Frequent PVCs   . GERD (gastroesophageal reflux disease)   . IBS (irritable bowel syndrome)   . Palpitations   . Recurrent UTI   . Seborrheic dermatitis of scalp 03/06/13  . Vitamin B 12 deficiency     Past Surgical History:  Procedure Laterality Date  . CHOLECYSTECTOMY N/A 08/23/2016   Procedure: LAPAROSCOPIC CHOLECYSTECTOMY;  Surgeon: Rolm Bookbinder, MD;  Location: [facility];  Service: General;  Laterality: N/A;  . COLECTOMY Right 1998   large colonic polyp  . ENDOSCOPIC VEIN LASER TREATMENT Bilateral 2010-2011  . KNEE ARTHROSCOPY W/ MENISCECTOMY Left   . [place]  2001  . SPINE SURGERY     3 since 11/2010, laminectomy, May 2012 lumbar fusion, diskectomy    There were no vitals filed for this visit.  Subjective Assessment - 03/16/20 1017    Subjective  I had a nice time at the beach and I did some walking.  Cardiac cath is this week and MRI is 04/13/20.    Pertinent History  lumbar surgeries: 11/2010 and 01/2011 discectomy surgeries.  L4-5 lumbar fusion 2012.  Lt knee surgery: 12/18/2018    Currently in Pain?  Yes    Pain Score  6     Pain Location  Back    Pain Orientation  Left;Right;Lower    Pain Descriptors / Indicators  Sore    Pain Type  Chronic pain    Pain Onset  More than a month ago    Pain Frequency  Constant    Aggravating Factors   standing long periods, getting dressed    Pain Relieving Factors  moving around, massage, pool, heat                       [facility] Adult PT Treatment/Exercise - 03/16/20 0001      Moist Heat Therapy   Number Minutes Moist Heat  10 Minutes    Moist Heat Location  Lumbar Spine      Manual Therapy   Manual Therapy  Soft tissue mobilization    Manual therapy comments  applied Biofreeze post session    Soft tissue mobilization  Lt > Rt gluteals and lumbar musculature in prone (due to feeling dizzy in sidelying)   Applied Biofreeze at the end, then 10 min of MHP              PT Short Term Goals - 03/09/20 [id number]      PT SHORT TERM GOAL #2   Title  perform 5x sit  to stand in < or = to 11.5 seconds to reduce falls risk    Time  4    Period  Weeks    Status  On-going        PT Long Term Goals - 03/16/20 1021      PT LONG TERM GOAL #1   Title  be independent in advanced HEP    Time  8    Period  Weeks    Status  On-going      PT LONG TERM GOAL #4   Title  perform regular walking for exercise and verbalize understanding of safe progression    Status  Achieved      PT LONG TERM GOAL #5   Title  improve LE strength and endurance to walk for 20-25 minutes without limitation    Baseline  depends on endurance related to cardiac condition    Status  On-going            Plan - 03/16/20 1054    Clinical Impression Statement  Pt was at the beach last week and was able to walk for 20 minutes.  Pt gets good relief with manual therapy and is able to do more with less LBP after the session. Pt arrived with 6/10 LBP due to increased activity with unpacking after the beach.  Pt has significant trigger points in the Lt upper gluteals that improved after  manual therapy and pt reported pain reduction after treatment.  PT encouraged pt to try dry needling again.  Pt will continue to benefit from skilled PT to improve tissue mobility and reduce pain to allow for improved function at home.    PT Treatment/Interventions  Patient/family education;ADLs/Self Care Home Management;Moist Heat;Electrical Stimulation;Stair training;Gait training;Functional mobility training;Neuromuscular re-education;Balance training;Therapeutic exercise;Therapeutic activities;Manual techniques;Taping;Dry needling;Passive range of motion    PT Next Visit Plan  MRI is in May.  Cardiac cath this week.  continue with soft tissue work to her glutals, low back and hips.    PT Home Exercise Plan  Access Code: [facility]    Recommended Other Services  recert is signed    Consulted and Agree with Plan of Care  Patient       Patient will benefit from skilled therapeutic intervention in order to improve the following deficits and impairments:  Abnormal gait, Decreased activity tolerance, Decreased strength, Impaired flexibility, Difficulty walking, Decreased mobility, Decreased endurance, Improper body mechanics, Postural dysfunction, Increased muscle spasms  Visit Diagnosis: Chronic bilateral low back pain without sciatica  Muscle weakness (generalized)  Abnormal posture  Other abnormalities of gait and mobility     Problem List Patient Active Problem List   Diagnosis Date Noted  . Gait abnormality 10/14/2019  . Mixed hyperlipidemia 07/31/2019  . Vitamin D deficiency disease 05/01/2019  . Chronic renal disease, stage 3, moderately decreased glomerular filtration rate (GFR) between 30-59 mL/min/1.73 square meter 05/01/2019  . History of adenomatous polyp of colon 07/17/2018  . Bladder prolapse, female, acquired 09/21/2017  . IFG (impaired fasting glucose) 10/21/2016  . Gastro-esophageal reflux disease without esophagitis 10/20/2016  . Idiopathic peripheral neuropathy  04/08/2016  . Osteoarthritis of spine with radiculopathy, lumbar region 11/24/2015  . Osteopenia 05/28/2014  . Essential hypertension 03/05/2014  . Irritable bowel syndrome (IBS) 03/27/2013  . Former smoker 03/06/2013  . Seborrheic dermatitis of scalp 03/06/2013  . Allergic rhinitis 01/03/2012  . Symptomatic PVCs 01/03/2012    Sigurd Sos, PT 03/16/20 11:01 AM  [facility] [address],  [place], [place], [address] Phone: [phone]   Fax:  [phone]  Name: BREONCA ANNETT MRN: [id number] Date of Birth: [birth year]

## 2020-03-17 ENCOUNTER — Telehealth: Payer: Self-pay | Admitting: *Deleted

## 2020-03-17 NOTE — Telephone Encounter (Signed)
Pt contacted pre-catheterization scheduled at [facility] for: Wednesday March 18, 2020 8:30 AM Verified arrival time and place: [facility]) at: 6:30 AM  No solid food after midnight prior to cath, clear liquids until 5 AM day of procedure.   AM meds can be  taken pre-cath with sip of water including: ASA 81 mg   Confirmed patient has responsible adult to drive home post procedure and observe 24 hours after arriving home: yes  Currently, due to Covid-19 pandemic, only one person will be allowed with patient. Must be the same person for patient's entire stay and will be required to wear a mask. They will be asked to wait in the waiting room for the duration of the patient's stay.  Patients are required to wear a mask when they enter the hospital.      COVID-19 Pre-Screening Questions:  . In the past 7 to 10 days have you had a cough,  shortness of breath, headache, congestion, fever (100 or greater) body aches, chills, sore throat, or sudden loss of taste or sense of smell? Shortness of breath, not new . Have you been around anyone with known Covid 19 in the past 7 to 10 days? no . Have you been around anyone who is awaiting Covid 19 test results in the past 7 to 10 days? no . Have you been around anyone who  has mentioned symptoms of Covid 19 within the past 7 to 10 days? No   Reviewed procedure/mask/visitor instructions, COVID-19 screening questions with patient.

## 2020-03-18 ENCOUNTER — Telehealth: Payer: Self-pay | Admitting: Internal Medicine

## 2020-03-18 ENCOUNTER — Encounter: Payer: Medicare Other | Admitting: Physical Therapy

## 2020-03-18 ENCOUNTER — Other Ambulatory Visit: Payer: Self-pay

## 2020-03-18 ENCOUNTER — Encounter (HOSPITAL_COMMUNITY)
Admission: RE | Disposition: A | Discharge status: Home / Self Care | Payer: Self-pay | Source: Home / Self Care | Attending: Interventional Cardiology

## 2020-03-18 ENCOUNTER — Ambulatory Visit (HOSPITAL_COMMUNITY)
Admission: RE | Admit: 2020-03-18 | Discharge: 2020-03-18 | Disposition: A | Discharge status: Home / Self Care | Payer: Medicare Other | Attending: Interventional Cardiology | Admitting: Interventional Cardiology

## 2020-03-18 ENCOUNTER — Encounter: Payer: Self-pay | Admitting: Internal Medicine

## 2020-03-18 DIAGNOSIS — R931 Abnormal findings on diagnostic imaging of heart and coronary circulation: Secondary | ICD-10-CM

## 2020-03-18 DIAGNOSIS — Z79899 Other long term (current) drug therapy: Secondary | ICD-10-CM | POA: Diagnosis not present

## 2020-03-18 DIAGNOSIS — E538 Deficiency of other specified B group vitamins: Secondary | ICD-10-CM | POA: Insufficient documentation

## 2020-03-18 DIAGNOSIS — R000 Tachycardia, unspecified: Secondary | ICD-10-CM | POA: Insufficient documentation

## 2020-03-18 DIAGNOSIS — R0602 Shortness of breath: Secondary | ICD-10-CM | POA: Diagnosis not present

## 2020-03-18 DIAGNOSIS — K589 Irritable bowel syndrome without diarrhea: Secondary | ICD-10-CM | POA: Diagnosis not present

## 2020-03-18 DIAGNOSIS — K219 Gastro-esophageal reflux disease without esophagitis: Secondary | ICD-10-CM | POA: Insufficient documentation

## 2020-03-18 DIAGNOSIS — Z888 Allergy status to other drugs, medicaments and biological substances status: Secondary | ICD-10-CM | POA: Insufficient documentation

## 2020-03-18 DIAGNOSIS — I5021 Acute systolic (congestive) heart failure: Secondary | ICD-10-CM | POA: Diagnosis not present

## 2020-03-18 DIAGNOSIS — Z881 Allergy status to other antibiotic agents status: Secondary | ICD-10-CM | POA: Insufficient documentation

## 2020-03-18 DIAGNOSIS — I493 Ventricular premature depolarization: Secondary | ICD-10-CM | POA: Insufficient documentation

## 2020-03-18 DIAGNOSIS — Z87891 Personal history of nicotine dependence: Secondary | ICD-10-CM | POA: Insufficient documentation

## 2020-03-18 HISTORY — PX: RIGHT/LEFT HEART CATH AND CORONARY ANGIOGRAPHY: CATH118266

## 2020-03-18 LAB — POCT I-STAT 7, (LYTES, BLD GAS, ICA,H+H)
Acid-Base Excess: 1 mmol/L (ref 0.0–2.0)
Bicarbonate: 26 mmol/L (ref 20.0–28.0)
Bicarbonate: 26.2 mmol/L (ref 20.0–28.0)
Calcium, Ion: 1.22 mmol/L (ref 1.15–1.40)
Calcium, Ion: 1.24 mmol/L (ref 1.15–1.40)
HCT: 36 % (ref 36.0–46.0)
HCT: 36 % (ref 36.0–46.0)
Hemoglobin: 12.2 g/dL (ref 12.0–15.0)
Hemoglobin: 12.2 g/dL (ref 12.0–15.0)
O2 Saturation: 88 %
O2 Saturation: 90 %
Potassium: 4 mmol/L (ref 3.5–5.1)
Potassium: 4.1 mmol/L (ref 3.5–5.1)
Sodium: 143 mmol/L (ref 135–145)
Sodium: 143 mmol/L (ref 135–145)
TCO2: 27 mmol/L (ref 22–32)
TCO2: 28 mmol/L (ref 22–32)
pCO2 arterial: 45 mmHg (ref 32.0–48.0)
pCO2 arterial: 45.4 mmHg (ref 32.0–48.0)
pH, Arterial: 7.365 (ref 7.350–7.450)
pH, Arterial: 7.373 (ref 7.350–7.450)
pO2, Arterial: 57 mmHg — ABNORMAL LOW (ref 83.0–108.0)
pO2, Arterial: 60 mmHg — ABNORMAL LOW (ref 83.0–108.0)

## 2020-03-18 LAB — POCT I-STAT EG7
Acid-Base Excess: 1 mmol/L (ref 0.0–2.0)
Bicarbonate: 26.9 mmol/L (ref 20.0–28.0)
Calcium, Ion: 1.23 mmol/L (ref 1.15–1.40)
HCT: 36 % (ref 36.0–46.0)
Hemoglobin: 12.2 g/dL (ref 12.0–15.0)
O2 Saturation: 67 %
Potassium: 4.1 mmol/L (ref 3.5–5.1)
Sodium: 143 mmol/L (ref 135–145)
TCO2: 28 mmol/L (ref 22–32)
pCO2, Ven: 48.6 mmHg (ref 44.0–60.0)
pH, Ven: 7.351 (ref 7.250–7.430)
pO2, Ven: 37 mmHg (ref 32.0–45.0)

## 2020-03-18 SURGERY — RIGHT/LEFT HEART CATH AND CORONARY ANGIOGRAPHY
Anesthesia: LOCAL

## 2020-03-18 MED ORDER — ACETAMINOPHEN 325 MG PO TABS: 650.0000 mg | ORAL_TABLET | ORAL | Status: DC | PRN

## 2020-03-18 MED ORDER — VERAPAMIL HCL 2.5 MG/ML IV SOLN
INTRAVENOUS | Status: DC | PRN
  Administered 2020-03-18: 09:00:00 10 mL via INTRA_ARTERIAL

## 2020-03-18 MED ORDER — SODIUM CHLORIDE 0.9% FLUSH: 3.0000 mL | Freq: Two times a day (BID) | INTRAVENOUS | Status: DC

## 2020-03-18 MED ORDER — SODIUM CHLORIDE 0.9 % WEIGHT BASED INFUSION: 1.0000 mL/kg/h | INTRAVENOUS | Status: DC

## 2020-03-18 MED ORDER — LIDOCAINE HCL (PF) 1 % IJ SOLN
INTRAMUSCULAR | Status: AC
  Filled 2020-03-18: qty 30

## 2020-03-18 MED ORDER — ASPIRIN 81 MG PO CHEW: 81.0000 mg | CHEWABLE_TABLET | ORAL | Status: DC

## 2020-03-18 MED ORDER — FENTANYL CITRATE (PF) 100 MCG/2ML IJ SOLN
INTRAMUSCULAR | Status: AC
  Filled 2020-03-18: qty 2

## 2020-03-18 MED ORDER — LIDOCAINE HCL (PF) 1 % IJ SOLN
INTRAMUSCULAR | Status: DC | PRN
  Administered 2020-03-18: 5 mL

## 2020-03-18 MED ORDER — HYDRALAZINE HCL 20 MG/ML IJ SOLN: 10.0000 mg | INTRAMUSCULAR | Status: DC | PRN

## 2020-03-18 MED ORDER — HEPARIN SODIUM (PORCINE) 1000 UNIT/ML IJ SOLN
INTRAMUSCULAR | Status: DC | PRN
  Administered 2020-03-18: 4000 [IU] via INTRAVENOUS

## 2020-03-18 MED ORDER — MIDAZOLAM HCL 2 MG/2ML IJ SOLN
INTRAMUSCULAR | Status: DC | PRN
  Administered 2020-03-18: 1 mg via INTRAVENOUS
  Administered 2020-03-18: 2 mg via INTRAVENOUS

## 2020-03-18 MED ORDER — MIDAZOLAM HCL 2 MG/2ML IJ SOLN
INTRAMUSCULAR | Status: AC
  Filled 2020-03-18: qty 2

## 2020-03-18 MED ORDER — FENTANYL CITRATE (PF) 100 MCG/2ML IJ SOLN
INTRAMUSCULAR | Status: DC | PRN
  Administered 2020-03-18 (×2): 25 ug via INTRAVENOUS

## 2020-03-18 MED ORDER — IOHEXOL 350 MG/ML SOLN
INTRAVENOUS | Status: DC | PRN
  Administered 2020-03-18: 35 mL

## 2020-03-18 MED ORDER — HEPARIN (PORCINE) IN NACL 1000-0.9 UT/500ML-% IV SOLN
INTRAVENOUS | Status: DC | PRN
  Administered 2020-03-18 (×2): 500 mL

## 2020-03-18 MED ORDER — SODIUM CHLORIDE 0.9 % IV SOLN: INTRAVENOUS | Status: DC

## 2020-03-18 MED ORDER — SODIUM CHLORIDE 0.9 % IV SOLN: 250.0000 mL | INTRAVENOUS | Status: DC | PRN

## 2020-03-18 MED ORDER — VERAPAMIL HCL 2.5 MG/ML IV SOLN
INTRAVENOUS | Status: AC
  Filled 2020-03-18: qty 2

## 2020-03-18 MED ORDER — SODIUM CHLORIDE 0.9 % WEIGHT BASED INFUSION
3.0000 mL/kg/h | INTRAVENOUS | Status: AC
  Administered 2020-03-18: 3 mL/kg/h via INTRAVENOUS

## 2020-03-18 MED ORDER — SODIUM CHLORIDE 0.9% FLUSH: 3.0000 mL | INTRAVENOUS | Status: DC | PRN

## 2020-03-18 MED ORDER — HEPARIN SODIUM (PORCINE) 1000 UNIT/ML IJ SOLN
INTRAMUSCULAR | Status: AC
  Filled 2020-03-18: qty 1

## 2020-03-18 MED ORDER — ONDANSETRON HCL 4 MG/2ML IJ SOLN: 4.0000 mg | Freq: Four times a day (QID) | INTRAMUSCULAR | Status: DC | PRN

## 2020-03-18 MED ORDER — LABETALOL HCL 5 MG/ML IV SOLN: 10.0000 mg | INTRAVENOUS | Status: DC | PRN

## 2020-03-18 MED ORDER — HEPARIN (PORCINE) IN NACL 1000-0.9 UT/500ML-% IV SOLN
INTRAVENOUS | Status: AC
  Filled 2020-03-18: qty 1000

## 2020-03-18 SURGICAL SUPPLY — 12 items
CATH 5FR JL3.5 JR4 ANG PIG MP (CATHETERS) ×2 IMPLANT
CATH BALLN WEDGE 5F 110CM (CATHETERS) ×2 IMPLANT
DEVICE RAD COMP TR BAND LRG (VASCULAR PRODUCTS) ×2 IMPLANT
GLIDESHEATH SLEND SS 6F .021 (SHEATH) ×2 IMPLANT
GUIDEWIRE INQWIRE 1.5J.035X260 (WIRE) ×1 IMPLANT
INQWIRE 1.5J .035X260CM (WIRE) ×2
KIT HEART LEFT (KITS) ×2 IMPLANT
PACK CARDIAC CATHETERIZATION (CUSTOM PROCEDURE TRAY) ×2 IMPLANT
SHEATH GLIDE SLENDER 4/5FR (SHEATH) ×2 IMPLANT
SHEATH PROBE COVER 6X72 (BAG) ×2 IMPLANT
TRANSDUCER W/STOPCOCK (MISCELLANEOUS) ×2 IMPLANT
TUBING CIL FLEX 10 FLL-RA (TUBING) ×2 IMPLANT

## 2020-03-18 NOTE — Telephone Encounter (Signed)
Left message for patient regarding appointment for cardiac mri scheduled Monday 04/13/20 at 9:00 am--arrival time is 8:15 am 1st floor radiology---will mail information to patient and it is also available in My Chart

## 2020-03-18 NOTE — Interval H&P Note (Signed)
Cath Lab Visit (complete for each Cath Lab visit)  Clinical Evaluation Leading to the Procedure:   ACS: No.  Non-ACS:    Anginal Classification: CCS II  Anti-ischemic medical therapy: Minimal Therapy (1 class of medications)  Non-Invasive Test Results: Intermediate-risk stress test findings: cardiac mortality 1-3%/year  Prior CABG: No previous CABG   Low EF.  I answered all questions about cath.   History and Physical Interval Note:  03/18/2020 8:31 AM  Carrie [name]  has presented today for surgery, with the diagnosis of shortness of breath - cardiomyopathy.  The various methods of treatment have been discussed with the patient and family. After consideration of risks, benefits and other options for treatment, the patient has consented to  Procedure(s): RIGHT/LEFT HEART CATH AND CORONARY ANGIOGRAPHY (N/A) as a surgical intervention.  The patient's history has been reviewed, patient examined, no change in status, stable for surgery.  I have reviewed the patient's chart and labs.  Questions were answered to the patient's satisfaction.     Larae Grooms

## 2020-03-18 NOTE — Discharge Instructions (Signed)
DRINK PLENTY OF FLUIDS FOR THE NEXT 2-3 DAYS.  KEEP ARM ELEVATED THE REMAINDER OF THE DAY.  Radial Site Care  This sheet gives you information about how to care for yourself after your procedure. Your health care provider may also give you more specific instructions. If you have problems or questions, contact your health care provider. What can I expect after the procedure? After the procedure, it is common to have:  Bruising and tenderness at the catheter insertion area. Follow these instructions at home: Medicines  Take over-the-counter and prescription medicines only as told by your health care provider. Insertion site care 1. Follow instructions from your health care provider about how to take care of your insertion site. Make sure you: ? Wash your hands with soap and water before you change your bandage (dressing). If soap and water are not available, use hand sanitizer. ? Change your dressing as told by your health care provider. 2. Check your insertion site every day for signs of infection. Check for: ? Redness, swelling, or pain. ? Fluid or blood. ? Pus or a bad smell. ? Warmth. 3. Do not take baths, swim, or use a hot tub for 5 days. 4. You may shower 24-48 hours after the procedure. ? Remove the dressing and gently wash the site with plain soap and water. ? Pat the area dry with a clean towel. ? Do not rub the site. That could cause bleeding. 5. Do not apply powder or lotion to the site. Activity  1. For 24 hours after the procedure, or as directed by your health care provider: ? Do not flex or bend the affected arm. ? Do not push or pull heavy objects with the affected arm. ? Do not drive yourself home from the hospital or clinic. You may drive 24 hours after the procedure. ? Do not operate machinery or power tools. 2. Do not push, pull or lift anything that is heavier than 10 lb for 5 days. 3. Ask your health care provider when it is okay to: ? Return to work or  school. ? Resume usual physical activities or sports. ? Resume sexual activity. General instructions  If the catheter site starts to bleed, raise your arm and put firm pressure on the site. If the bleeding does not stop, get help right away. This is a medical emergency.  If you went home on the same day as your procedure, a responsible adult should be with you for the first 24 hours after you arrive home.  Keep all follow-up visits as told by your health care provider. This is important. Contact a health care provider if:  You have a fever.  You have redness, swelling, or yellow drainage around your insertion site. Get help right away if:  You have unusual pain at the radial site.  The catheter insertion area swells very fast.  The insertion area is bleeding, and the bleeding does not stop when you hold steady pressure on the area.  Your arm or hand becomes pale, cool, tingly, or numb. These symptoms may represent a serious problem that is an emergency. Do not wait to see if the symptoms will go away. Get medical help right away. Call your local emergency services (911 in the [place] Do not drive yourself to the hospital. Summary  After the procedure, it is common to have bruising and tenderness at the site.  Follow instructions from your health care provider about how to take care of your radial site wound. Check   the wound every day for signs of infection.  Do not push, pull or lift anything that is heavier than 10 lb for 5 days.  This information is not intended to replace advice given to you by your health care provider. Make sure you discuss any questions you have with your health care provider. Document Revised: 12/27/2017 Document Reviewed: 12/27/2017 Elsevier Patient Education  2020 [employer]vier Inc[employer] 

## 2020-03-23 ENCOUNTER — Ambulatory Visit: Payer: Medicare Other | Admitting: Physical Therapy

## 2020-03-23 ENCOUNTER — Encounter: Payer: Self-pay | Admitting: Physical Therapy

## 2020-03-23 ENCOUNTER — Other Ambulatory Visit: Payer: Self-pay

## 2020-03-23 DIAGNOSIS — R293 Abnormal posture: Secondary | ICD-10-CM

## 2020-03-23 DIAGNOSIS — R2689 Other abnormalities of gait and mobility: Secondary | ICD-10-CM | POA: Diagnosis not present

## 2020-03-23 DIAGNOSIS — M6281 Muscle weakness (generalized): Secondary | ICD-10-CM | POA: Diagnosis not present

## 2020-03-23 DIAGNOSIS — G8929 Other chronic pain: Secondary | ICD-10-CM

## 2020-03-23 DIAGNOSIS — M545 Low back pain: Secondary | ICD-10-CM | POA: Diagnosis not present

## 2020-03-23 NOTE — Therapy (Signed)
[facility] [address], [place], [place], [address] Phone: [phone]   Fax:  [phone]  Physical Therapy Treatment  Patient Details  Name: Carrie [name] MRN: [id number] Date of Birth: 1946-08-04 Referring Provider (PT): Jovita Gamma, MD   Encounter Date: 03/23/2020  PT End of Session - 03/23/20 1015    Visit Number  27    Date for PT Re-Evaluation  03/25/20    Authorization Type  KX modifier    Authorization Time Period  11-05-19 -02-02-20    PT Start Time  1015    PT Stop Time  1105    PT Time Calculation (min)  50 min    Activity Tolerance  Patient tolerated treatment well    Behavior During Therapy  [facility] for tasks assessed/performed       Past Medical History:  Diagnosis Date  . BPPV (benign paroxysmal positional vertigo), bilateral 04/11/2019  . Colon polyps   . Esophageal spasm   . Fatigue   . Frequent PVCs   . GERD (gastroesophageal reflux disease)   . IBS (irritable bowel syndrome)   . Palpitations   . Recurrent UTI   . Seborrheic dermatitis of scalp 03/06/13  . Vitamin B 12 deficiency     Past Surgical History:  Procedure Laterality Date  . CHOLECYSTECTOMY N/A 08/23/2016   Procedure: LAPAROSCOPIC CHOLECYSTECTOMY;  Surgeon: Rolm Bookbinder, MD;  Location: [facility];  Service: General;  Laterality: N/A;  . COLECTOMY Right 1998   large colonic polyp  . ENDOSCOPIC VEIN LASER TREATMENT Bilateral 2010-2011  . KNEE ARTHROSCOPY W/ MENISCECTOMY Left   . [place]  2001  . RIGHT/LEFT HEART CATH AND CORONARY ANGIOGRAPHY N/A 03/18/2020   Procedure: RIGHT/LEFT HEART CATH AND CORONARY ANGIOGRAPHY;  Surgeon: Jettie Booze, MD;  Location: [facility];  Service: Cardiovascular;  Laterality: N/A;  . SPINE SURGERY     3 since 11/2010, laminectomy, May 2012 lumbar fusion, diskectomy    There were no vitals filed for this visit.  Subjective Assessment - 03/23/20 1022    Subjective  My  back was really hurting this AM. I see a new Cardiologist this Friday. Pt reports continued fluctuations in her BP and pulse. Cardiac cath was clear.    Pertinent History  lumbar surgeries: 11/2010 and 01/2011 discectomy surgeries.  L4-5 lumbar fusion 2012.  Lt knee surgery: 12/18/2018    Currently in Pain?  Yes    Pain Score  7     Pain Location  Back    Pain Orientation  Left    Pain Descriptors / Indicators  Sore    Aggravating Factors   Standing too long, walking    Pain Relieving Factors  Soft tissue work, pool walking    Multiple Pain Sites  No                       [facility] Adult PT Treatment/Exercise - 03/23/20 0001      Lumbar Exercises: Stretches   Single Knee to Chest Stretch  Right;Left;2 reps;20 seconds    Lower Trunk Rotation  3 reps;10 seconds    Pelvic Tilt  10 reps    Other Lumbar Stretch Exercise  lateral hip stretch bil 3x 20 sec   Pt slow     Manual Therapy   Manual Therapy  Soft tissue mobilization    Manual therapy comments  applied Biofreeze post session    Soft tissue mobilization  Lt > Rt  gluteals and lumbar musculature in LT sidelyingi   Applied Biofreeze at the end, then 10 min of MHP              PT Short Term Goals - 03/09/20 0937      PT SHORT TERM GOAL #2   Title  perform 5x sit to stand in < or = to 11.5 seconds to reduce falls risk    Time  4    Period  Weeks    Status  On-going        PT Long Term Goals - 03/16/20 1021      PT LONG TERM GOAL #1   Title  be independent in advanced HEP    Time  8    Period  Weeks    Status  On-going      PT LONG TERM GOAL #4   Title  perform regular walking for exercise and verbalize understanding of safe progression    Status  Achieved      PT LONG TERM GOAL #5   Title  improve LE strength and endurance to walk for 20-25 minutes without limitation    Baseline  depends on endurance related to cardiac condition    Status  On-going            Plan - 03/23/20 1054     Clinical Impression Statement  Pt arrives with increased back pain and very stiff with her mobility. PTA suggested some stretching to help her increase her lumbopelvic mobility. Pt agreed and performed a generla mat stretching routine with no issues, just slow. No pain with any stretching. Pt's gluteal mobility much improved. She has one thickened spot lower LT lumbopelvic that required extra time spent.    Personal Factors and Comorbidities  Comorbidity 2    Comorbidities  3 lumbar surgeries, Lt knee surgery    Examination-Activity Limitations  Caring for Others;Carry;Squat;Stand;Stairs;Locomotion Level    Examination-Participation Restrictions  Community Activity;Meal Prep;Cleaning;Laundry;Shop    Stability/Clinical Decision Making  Evolving/Moderate complexity    Rehab Potential  Good    PT Duration  8 weeks    PT Treatment/Interventions  Patient/family education;ADLs/Self Care Home Management;Moist Heat;Electrical Stimulation;Stair training;Gait training;Functional mobility training;Neuromuscular re-education;Balance training;Therapeutic exercise;Therapeutic activities;Manual techniques;Taping;Dry needling;Passive range of motion    PT Next Visit Plan  MRI is in May.  Cardiac cath this week.  continue with soft tissue work to her glutals, low back and hips. Pt does want to try dry needling again.    PT Home Exercise Plan  Access Code: [facility]    Consulted and Agree with Plan of Care  Patient       Patient will benefit from skilled therapeutic intervention in order to improve the following deficits and impairments:  Abnormal gait, Decreased activity tolerance, Decreased strength, Impaired flexibility, Difficulty walking, Decreased mobility, Decreased endurance, Improper body mechanics, Postural dysfunction, Increased muscle spasms  Visit Diagnosis: Chronic bilateral low back pain without sciatica  Muscle weakness (generalized)  Abnormal posture  Other abnormalities of gait and  mobility     Problem List Patient Active Problem List   Diagnosis Date Noted  . Gait abnormality 10/14/2019  . Mixed hyperlipidemia 07/31/2019  . Vitamin D deficiency disease 05/01/2019  . Chronic renal disease, stage 3, moderately decreased glomerular filtration rate (GFR) between 30-59 mL/min/1.73 square meter 05/01/2019  . History of adenomatous polyp of colon 07/17/2018  . Bladder prolapse, female, acquired 09/21/2017  . IFG (impaired fasting glucose) 10/21/2016  . Gastro-esophageal reflux disease without esophagitis 10/20/2016  .  Idiopathic peripheral neuropathy 04/08/2016  . Osteoarthritis of spine with radiculopathy, lumbar region 11/24/2015  . Osteopenia 05/28/2014  . Essential hypertension 03/05/2014  . Irritable bowel syndrome (IBS) 03/27/2013  . Former smoker 03/06/2013  . Seborrheic dermatitis of scalp 03/06/2013  . Allergic rhinitis 01/03/2012  . Symptomatic PVCs 01/03/2012    Ixel Boehning, PTA 03/23/2020, 11:04 AM  [facility] [address], [place], [place], [address] Phone: [phone]   Fax:  [phone]  Name: ARPI FILARDI MRN: [id number] Date of Birth: 1946/07/22

## 2020-03-25 ENCOUNTER — Other Ambulatory Visit: Payer: Self-pay

## 2020-03-25 ENCOUNTER — Ambulatory Visit: Payer: Medicare Other | Admitting: Physical Therapy

## 2020-03-25 ENCOUNTER — Encounter: Payer: Self-pay | Admitting: Physical Therapy

## 2020-03-25 DIAGNOSIS — G8929 Other chronic pain: Secondary | ICD-10-CM

## 2020-03-25 DIAGNOSIS — R293 Abnormal posture: Secondary | ICD-10-CM | POA: Diagnosis not present

## 2020-03-25 DIAGNOSIS — R2689 Other abnormalities of gait and mobility: Secondary | ICD-10-CM

## 2020-03-25 DIAGNOSIS — M545 Low back pain: Secondary | ICD-10-CM | POA: Diagnosis not present

## 2020-03-25 DIAGNOSIS — M6281 Muscle weakness (generalized): Secondary | ICD-10-CM

## 2020-03-25 NOTE — Therapy (Addendum)
[facility] [address] [place], [place], [address] Phone: [phone]   Fax:  [phone]  Physical Therapy Treatment  Patient Details  Name: Carrie [name] MRN: [id number] Date of Birth: [birth year] Referring Provider (PT): Jovita Gamma, MD   Encounter Date: 03/25/2020  PT End of Session - 03/25/20 1047    Visit Number  28    Date for PT Re-Evaluation  04/22/20    Authorization Type  KX modifier    PT Start Time  1016    PT Stop Time  1100    PT Time Calculation (min)  44 min    Activity Tolerance  Patient tolerated treatment well    Behavior During Therapy  [facility] for tasks assessed/performed       Past Medical History:  Diagnosis Date  . BPPV (benign paroxysmal positional vertigo), bilateral 04/11/2019  . Colon polyps   . Esophageal spasm   . Fatigue   . Frequent PVCs   . GERD (gastroesophageal reflux disease)   . IBS (irritable bowel syndrome)   . Palpitations   . Recurrent UTI   . Seborrheic dermatitis of scalp 03/06/13  . Vitamin B 12 deficiency     Past Surgical History:  Procedure Laterality Date  . CHOLECYSTECTOMY N/A 08/23/2016   Procedure: LAPAROSCOPIC CHOLECYSTECTOMY;  Surgeon: Rolm Bookbinder, MD;  Location: [facility];  Service: General;  Laterality: N/A;  . COLECTOMY Right 1998   large colonic polyp  . ENDOSCOPIC VEIN LASER TREATMENT Bilateral 2010-2011  . KNEE ARTHROSCOPY W/ MENISCECTOMY Left   . [place]  2001  . RIGHT/LEFT HEART CATH AND CORONARY ANGIOGRAPHY N/A 03/18/2020   Procedure: RIGHT/LEFT HEART CATH AND CORONARY ANGIOGRAPHY;  Surgeon: Jettie Booze, MD;  Location: [facility];  Service: Cardiovascular;  Laterality: N/A;  . SPINE SURGERY     3 since 11/2010, laminectomy, May 2012 lumbar fusion, diskectomy    There were no vitals filed for this visit.  Subjective Assessment - 03/25/20 1048    Subjective  Got my shingles vaccine this week and I was in the  bed for about 24 hrs feeling terrible. My back is achey today but it could be lingering ache from vaccine. i am ready to try dry needling one time more.    Pertinent History  lumbar surgeries: 11/2010 and 01/2011 discectomy surgeries.  L4-5 lumbar fusion 2012.  Lt knee surgery: 12/18/2018    Currently in Pain?  Yes    Pain Score  3     Pain Location  Back    Pain Orientation  Left    Pain Descriptors / Indicators  Dull;Aching    Multiple Pain Sites  No         [facility] PT Assessment - 03/25/20 0001      Assessment   Medical Diagnosis  lumbar stenosis with neurogenic claudication    Referring Provider (PT)  Jovita Gamma, MD    Onset Date/Surgical Date  12/09/18      Observation/Other Assessments   Focus on Therapeutic Outcomes (FOTO)   48% limitation      Strength   Strength Assessment Site  Hip;Knee    Left Hip Flexion  4/5    Left Knee Extension  4+/5                   [facility] Adult PT Treatment/Exercise - 03/25/20 0001      Knee/Hip Exercises: Seated   Sit to Sand  --   5x  sit to stand 11 sec     Manual Therapy   Manual Therapy  Soft tissue mobilization    Manual therapy comments  applied Biofreeze post session    Soft tissue mobilization  Lt > Rt gluteals and lumbar musculature in LT sidelyingi   Applied Biofreeze at the end, then 10 min of MHP      Trigger Point Dry Needling - 03/25/20 0001    Consent Given?  Yes    Muscles Treated Back/Hip  Gluteus minimus;Gluteus medius    Gluteus Minimus Response  Twitch response elicited;Palpable increased muscle length   Lt only   Gluteus Medius Response  Twitch response elicited;Palpable increased muscle length   Lt only            PT Short Term Goals - 03/25/20 1055      PT SHORT TERM GOAL #3   Title  demonstrate heel strike bilaterally with gait on level surface > or = to 50% of the time to reduce falls risk    Time  4    Period  Weeks    Status  Achieved    Target Date  01/07/20      PT SHORT  TERM GOAL #4   Title  improve endurance to walk for 10-15 minutes without significant fatigue to improve community endurance    Time  4    Period  Weeks    Status  Achieved    Target Date  01/07/20        PT Long Term Goals - 03/25/20 1109      PT LONG TERM GOAL #1   Title  be independent in advanced HEP    Time  4    Period  Weeks    Status  On-going    Target Date  04/24/20      PT LONG TERM GOAL #2   Baseline  knee: flexion 4/5, extension 4+/5 bil    Time  4    Period  Weeks    Status  On-going    Target Date  04/24/20      PT LONG TERM GOAL #3   Title  reduce FOTO to < or = to 47% limitation    Baseline  46% limitation    Status  Achieved      PT LONG TERM GOAL #4   Title  perform regular walking for exercise and verbalize understanding of safe progression    Time  4    Period  Weeks    Status  Achieved      PT LONG TERM GOAL #5   Title  improve LE strength and endurance to walk for 20-25 minutes without limitation    Baseline  depends on endurance related to cardiac condition    Time  4    Period  Weeks    Status  On-going      PT LONG TERM GOAL #6   Title  report a 75% reduction in LBP with standing and walking to improve safety and independepence in the community    Time  8    Period  Weeks    Status  On-going   50%           Plan - 03/25/20 1050    Clinical Impression Statement  Pt arrives today with mild Lt LBP.  She had earlier in th eweek got her Shingles vaccine which she had a bad reaction. She received dry needling today to her LT lumbar and glutes.  Pt will attend 4 more sessions for advancement of HEP and instruction in safe exercise related to cardiac condition.    Personal Factors and Comorbidities  Comorbidity 2    Comorbidities  3 lumbar surgeries, Lt knee surgery    Examination-Activity Limitations  Caring for Others;Carry;Squat;Stand;Stairs;Locomotion Level    Examination-Participation Restrictions  Community Activity;Meal  Prep;Cleaning;Laundry;Shop    Stability/Clinical Decision Making  Evolving/Moderate complexity    Rehab Potential  Good    PT Frequency  2x / week    PT Duration  4 weeks    PT Treatment/Interventions  Patient/family education;ADLs/Self Care Home Management;Moist Heat;Electrical Stimulation;Stair training;Gait training;Functional mobility training;Neuromuscular re-education;Balance training;Therapeutic exercise;Therapeutic activities;Manual techniques;Taping;Dry needling;Passive range of motion    PT Next Visit Plan  Assess DN, pt to see new cardiologist this Friday. 2-4 more sessions probable.    PT Home Exercise Plan  Access Code: [facility]    Recommended Other Services  recert sent [id number]    Consulted and Agree with Plan of Care  Patient       Patient will benefit from skilled therapeutic intervention in order to improve the following deficits and impairments:  Abnormal gait, Decreased activity tolerance, Decreased strength, Impaired flexibility, Difficulty walking, Decreased mobility, Decreased endurance, Improper body mechanics, Postural dysfunction, Increased muscle spasms  Visit Diagnosis: Chronic bilateral low back pain without sciatica - Plan: PT plan of care cert/re-cert  Muscle weakness (generalized) - Plan: PT plan of care cert/re-cert  Abnormal posture - Plan: PT plan of care cert/re-cert  Other abnormalities of gait and mobility - Plan: PT plan of care cert/re-cert     Problem List Patient Active Problem List   Diagnosis Date Noted  . Gait abnormality 10/14/2019  . Mixed hyperlipidemia 07/31/2019  . Vitamin D deficiency disease 05/01/2019  . Chronic renal disease, stage 3, moderately decreased glomerular filtration rate (GFR) between 30-59 mL/min/1.73 square meter 05/01/2019  . History of adenomatous polyp of colon 07/17/2018  . Bladder prolapse, female, acquired 09/21/2017  . IFG (impaired fasting glucose) 10/21/2016  . Gastro-esophageal reflux disease without  esophagitis 10/20/2016  . Idiopathic peripheral neuropathy 04/08/2016  . Osteoarthritis of spine with radiculopathy, lumbar region 11/24/2015  . Osteopenia 05/28/2014  . Essential hypertension 03/05/2014  . Irritable bowel syndrome (IBS) 03/27/2013  . Former smoker 03/06/2013  . Seborrheic dermatitis of scalp 03/06/2013  . Allergic rhinitis 01/03/2012  . Symptomatic PVCs 01/03/2012   Myrene Galas, PTA 03/25/20 1:33 PM Sigurd Sos, PT 03/25/20 1:33 PM  [facility] [address], [place], [place], [address] Phone: [phone]   Fax:  [phone]  Name: Carrie [name] MRN: [id number] Date of Birth: 27-Oct-1946

## 2020-03-25 NOTE — Addendum Note (Signed)
Addended by: Danie Binder on: 03/25/2020 01:34 PM   Modules accepted: Orders

## 2020-03-26 ENCOUNTER — Ambulatory Visit: Payer: Medicare Other | Admitting: Internal Medicine

## 2020-03-27 ENCOUNTER — Encounter: Payer: Self-pay | Admitting: Cardiovascular Disease

## 2020-03-27 ENCOUNTER — Other Ambulatory Visit: Payer: Self-pay

## 2020-03-27 ENCOUNTER — Ambulatory Visit (INDEPENDENT_AMBULATORY_CARE_PROVIDER_SITE_OTHER): Payer: Medicare Other | Admitting: Cardiovascular Disease

## 2020-03-27 VITALS — BP 132/80 | HR 88 | Ht 63.5 in | Wt 187.0 lb

## 2020-03-27 DIAGNOSIS — I5022 Chronic systolic (congestive) heart failure: Secondary | ICD-10-CM | POA: Diagnosis not present

## 2020-03-27 DIAGNOSIS — I5042 Chronic combined systolic (congestive) and diastolic (congestive) heart failure: Secondary | ICD-10-CM | POA: Insufficient documentation

## 2020-03-27 DIAGNOSIS — I429 Cardiomyopathy, unspecified: Secondary | ICD-10-CM | POA: Diagnosis not present

## 2020-03-27 DIAGNOSIS — E8582 Wild-type transthyretin-related (ATTR) amyloidosis: Secondary | ICD-10-CM

## 2020-03-27 NOTE — Patient Instructions (Signed)
Medication Instructions:  Your physician has recommended you make the following change in your medication:  STOP Atorvastatin (Lipitor)  *If you need a refill on your cardiac medications before your next appointment, please call your pharmacy*   Lab Work: None Ordered If you have labs (blood work) drawn today and your tests are completely normal, you will receive your results only by: Marland Kitchen MyChart Message (if you have MyChart) OR . A paper copy in the mail If you have any lab test that is abnormal or we need to change your treatment, we will call you to review the results.   Testing/Procedures: None Ordered   Follow-Up: At [facility], you and your health needs are our priority.  As part of our continuing mission to provide you with exceptional heart care, we have created designated Provider Care Teams.  These Care Teams include your primary Cardiologist (physician) and Advanced Practice Providers (APPs -  Physician Assistants and Nurse Practitioners) who all work together to provide you with the care you need, when you need it.  We recommend signing up for the patient portal called "MyChart".  Sign up information is provided on this After Visit Summary.  MyChart is used to connect with patients for Virtual Visits (Telemedicine).  Patients are able to view lab/test results, encounter notes, upcoming appointments, etc.  Non-urgent messages can be sent to your provider as well.   To learn more about what you can do with MyChart, go to [email or web address].    Your next appointment:    As Needed  The format for your next appointment:   Either In Person or Virtual  Provider:   You may see Mertie Moores, MD or one of the following Advanced Practice Providers on your designated Care Team:    Richardson Dopp, PA-C  [place], [place]  Daune Perch, NP    Other Instructions You have been referred to [facility]

## 2020-03-27 NOTE — Progress Notes (Signed)
Cardiology Office Note:    Date:  03/27/2020   ID:  Carrie [name], DOB [birth year], MRN [id number]  PCP:  Leamon Arnt, MD  Cardiologist:  Cecille Mcclusky  Electrophysiologist: Rayann Heman   Referring MD: Thompson Grayer, MD   Chief Complaint  Patient presents with  . Congestive Heart Failure  . Palpitations     History of Present Illness:    Carrie [name] is a 74 y.o. female with a hx of premature ventricular contractions.     She is had some shortness of breath for the past several months especially while walking up a hill. No Cp   Is on atrovastatin  - although she does not have a dx of hyperlipidema or vascular disease   She is had numerous echocardiograms over the years.  Her echo from April, 2019 revealed an ejection fraction of 50% with grade 1 diastolic dysfunction.  She had trivial tricuspid regurgitation. Lexiscan Myoview study from April, 2019 was low risk and showed no evidence of ischemia.  Echocardiogram in February, 2020 reveals a left ventricular systolic function of 45 to 50%. Echocardiogram from March, 2021 reveals moderate - severe  reduction of LV function with an EF of 30 to 35%.  She has grade 1 diastolic dysfunction.  Cath showed no significant CAD    She had a severe  leg injury Jan. 2019.   ( torn meniscus)  Made some recovery but has declined ,  Has had progressive dyspnea   No CP   Hair loss since 6 months  Nail issues.   TSH is good.    Past Medical History:  Diagnosis Date  . BPPV (benign paroxysmal positional vertigo), bilateral 04/11/2019  . Colon polyps   . Esophageal spasm   . Fatigue   . Frequent PVCs   . GERD (gastroesophageal reflux disease)   . IBS (irritable bowel syndrome)   . Palpitations   . Recurrent UTI   . Seborrheic dermatitis of scalp 03/06/13  . Vitamin B 12 deficiency     Past Surgical History:  Procedure Laterality Date  . CHOLECYSTECTOMY N/A 08/23/2016   Procedure: LAPAROSCOPIC CHOLECYSTECTOMY;  Surgeon: Rolm Bookbinder, MD;  Location: [facility];  Service: General;  Laterality: N/A;  . COLECTOMY Right 1998   large colonic polyp  . ENDOSCOPIC VEIN LASER TREATMENT Bilateral 2010-2011  . KNEE ARTHROSCOPY W/ MENISCECTOMY Left   . [place]  2001  . RIGHT/LEFT HEART CATH AND CORONARY ANGIOGRAPHY N/A 03/18/2020   Procedure: RIGHT/LEFT HEART CATH AND CORONARY ANGIOGRAPHY;  Surgeon: Jettie Booze, MD;  Location: [facility];  Service: Cardiovascular;  Laterality: N/A;  . SPINE SURGERY     3 since 11/2010, laminectomy, May 2012 lumbar fusion, diskectomy    Current Medications: Current Meds  Medication Sig  . acetaminophen (TYLENOL) 500 MG tablet Take 1,000 mg by mouth daily as needed for moderate pain.  Marland Kitchen azelastine (ASTELIN) 0.1 % nasal spray Place 2 sprays into both nostrils 2 (two) times daily.  . Biotin 1000 MCG tablet Take 1,000 mcg by mouth daily.  . calcium carbonate (TUMS CALCIUM FOR LIFE BONE) 750 MG chewable tablet Chew 750 mg by mouth every evening.   . Cholecalciferol (VITAMIN D3) 2000 units TABS Take 2,000 Units by mouth every evening.   . Ciclopirox 1 % shampoo Apply 1 application topically every other day.   . disopyramide (NORPACE CR) 150 MG 12 hr capsule Take 1 capsule (150 mg total) by mouth daily.  Marland Kitchen docusate sodium (COLACE)  100 MG capsule Take 100 mg by mouth every evening.   . famotidine (PEPCID) 20 MG tablet Take 20 mg by mouth daily as needed for heartburn or indigestion.  . famotidine (PEPCID) 40 MG tablet Take 1 tablet (40 mg total) by mouth daily as needed for heartburn or indigestion.  Marland Kitchen Hyoscyamine Sulfate SL (LEVSIN/SL) 0.125 MG SUBL Place 1 tablet under the tongue every 6 (six) hours as needed.  . mometasone (ELOCON) 0.1 % lotion Apply 1 application topically as needed for irritation.  . Multiple Vitamin (MULTIVITAMIN) tablet Take 1 tablet by mouth daily.  . sodium chloride (MURO 128) 2 % ophthalmic solution Place 1 drop into both eyes at bedtime.   .  tretinoin (RETIN-A) 0.025 % cream Apply 1 application topically daily as needed (skin bumps).   . [DISCONTINUED] atorvastatin (LIPITOR) 10 MG tablet Take 5 mg by mouth every evening.     Allergies:   Phenergan [promethazine hcl], Erythromycin, Azithromycin, Epinephrine, and Nitrofurantoin   Social History   Socioeconomic History  . Marital status: Married    Spouse name: Not on file  . Number of children: 2  . Years of education: 16  . Highest education level: Not on file  Occupational History  . Occupation: Retired Therapist, sports  Tobacco Use  . Smoking status: Former Smoker    Quit date: 02/02/2013    Years since quitting: 7.1  . Smokeless tobacco: Never Used  Substance and Sexual Activity  . Alcohol use: Yes    Alcohol/week: 6.0 standard drinks    Types: 6 Glasses of wine per week  . Drug use: No  . Sexual activity: Yes  Other Topics Concern  . Not on file  Social History Narrative   Lives in [place] with spouse.   Previously Volunteered as a Contractor   Caffeine use: none, quit at least 30 years ago   Social Determinants of Radio broadcast assistant Strain:   . Difficulty of Paying Living Expenses:   Food Insecurity:   . Worried About Charity fundraiser in the Last Year:   . Arboriculturist in the Last Year:   Transportation Needs:   . Film/video editor (Medical):   Marland Kitchen Lack of Transportation (Non-Medical):   Physical Activity:   . Days of Exercise per Week:   . Minutes of Exercise per Session:   Stress:   . Feeling of Stress :   Social Connections:   . Frequency of Communication with Friends and Family:   . Frequency of Social Gatherings with Friends and Family:   . Attends Religious Services:   . Active Member of Clubs or Organizations:   . Attends Archivist Meetings:   Marland Kitchen Marital Status:      Family History: The patient's family history includes Cancer in her father and sister; Diabetes type II in her sister; High Cholesterol in her  mother; Hypertension in her mother, sister, sister, sister, and sister. There is no history of Early death, Diabetes, Heart disease, Kidney disease, Stroke, or Breast cancer.  ROS:   Please see the history of present illness.     All other systems reviewed and are negative.  EKGs/Labs/Other Studies Reviewed:    The following studies were reviewed today:   EKG:    Recent Labs: 07/29/2019: Magnesium 2.3 10/24/2019: ALT 18 02/03/2020: TSH 0.995 03/16/2020: BUN 17; Creatinine, Ser 0.97; Platelets 241 03/18/2020: Hemoglobin 12.2; Potassium 4.1; Sodium 143  Recent Lipid Panel    Component Value  Date/Time   CHOL 180 07/31/2019 1354   CHOL 201 (H) 07/25/2018 1039   TRIG 125.0 07/31/2019 1354   HDL 67.40 07/31/2019 1354   HDL 75 07/25/2018 1039   CHOLHDL 3 07/31/2019 1354   VLDL 25.0 07/31/2019 1354   LDLCALC 87 07/31/2019 1354   LDLCALC 109 (H) 07/25/2018 1039    Physical Exam:    VS:  BP 132/80   Pulse 88   Ht 5' 3.5" (1.613 m)   Wt 187 lb (84.8 kg)   SpO2 98%   BMI 32.61 kg/m     Wt Readings from Last 3 Encounters:  03/27/20 187 lb (84.8 kg)  03/18/20 183 lb (83 kg)  03/09/20 184 lb (83.5 kg)     GEN:   Elderly female,  Very weak HEENT: Normal NECK: No JVD; No carotid bruits LYMPHATICS: No lymphadenopathy CARDIAC:  RR  RESPIRATORY:  Clear to auscultation without rales, wheezing or rhonchi  ABDOMEN: Soft, non-tender, non-distended MUSCULOSKELETAL:  No edema; No deformity  SKIN: Warm and dry NEUROLOGIC:  Very weak,  PSYCHIATRIC:  Normal affect   ASSESSMENT:    1. Cardiomyopathy, unspecified type ([place])   2. Wild-type transthyretin-related (ATTR) amyloidosis ([place])   3. Chronic systolic heart failure (HCC)    PLAN:    In order of problems listed above:  1. Chronic combined systolic and diastolic congestive heart failure: Her ejection fraction has gradually fallen up the past several years.  Latest echo showed an EF of 30 to 35%.  She has grade 1 diastolic  dysfunction.  She has episodes of orthostatic hypotension and her blood pressure drops randomly on occasion.  I hesitate to start her on any medications at this time because of these blood pressure drops.  I think she needs a referral to the advanced heart failure clinic as I have very limited treatment options for her given her blood pressure fluctuations.  2.  Neuromuscular weakness.  She is profoundly weak.  She had a torn meniscus in 2019 and has never really fully recovered from that despite doing physical therapy.  When she walks it looks more like a Parkinson's-like syndrome.  That that may also explain her sudden blood pressure drops and orthostatic hypotension. I encouraged her to see a neurologist.  She may also benefit from seeing a neuro movement specialist.  There is a chance that the atorvastatin could be causing some muscle weakness and we discontinue the atorvastatin for now.    We will have her follow-up in the heart failure clinic.  I will see her back on an as-needed basis.     Medication Adjustments/Labs and Tests Ordered: Current medicines are reviewed at length with the patient today.  Concerns regarding medicines are outlined above.  Orders Placed This Encounter  Procedures  . AMB referral to CHF clinic   No orders of the defined types were placed in this encounter.   Patient Instructions  Medication Instructions:  Your physician has recommended you make the following change in your medication:  STOP Atorvastatin (Lipitor)  *If you need a refill on your cardiac medications before your next appointment, please call your pharmacy*   Lab Work: None Ordered If you have labs (blood work) drawn today and your tests are completely normal, you will receive your results only by: Marland Kitchen MyChart Message (if you have MyChart) OR . A paper copy in the mail If you have any lab test that is abnormal or we need to change your treatment, we will call you to  review the  results.   Testing/Procedures: None Ordered   Follow-Up: At [facility], you and your health needs are our priority.  As part of our continuing mission to provide you with exceptional heart care, we have created designated Provider Care Teams.  These Care Teams include your primary Cardiologist (physician) and Advanced Practice Providers (APPs -  Physician Assistants and Nurse Practitioners) who all work together to provide you with the care you need, when you need it.  We recommend signing up for the patient portal called "MyChart".  Sign up information is provided on this After Visit Summary.  MyChart is used to connect with patients for Virtual Visits (Telemedicine).  Patients are able to view lab/test results, encounter notes, upcoming appointments, etc.  Non-urgent messages can be sent to your provider as well.   To learn more about what you can do with MyChart, go to [email or web address].    Your next appointment:    As Needed  The format for your next appointment:   Either In Person or Virtual  Provider:   You may see Mertie Moores, MD or one of the following Advanced Practice Providers on your designated Care Team:    Richardson Dopp, PA-C  [place], [place]  Daune Perch, NP    Other Instructions You have been referred to [place], Mertie Moores, MD  03/27/2020 10:57 AM    [place]

## 2020-04-01 ENCOUNTER — Telehealth (HOSPITAL_COMMUNITY): Payer: Self-pay | Admitting: Emergency Medicine

## 2020-04-01 ENCOUNTER — Ambulatory Visit: Payer: Medicare Other

## 2020-04-01 ENCOUNTER — Other Ambulatory Visit: Payer: Self-pay

## 2020-04-01 DIAGNOSIS — R2689 Other abnormalities of gait and mobility: Secondary | ICD-10-CM

## 2020-04-01 DIAGNOSIS — G8929 Other chronic pain: Secondary | ICD-10-CM | POA: Diagnosis not present

## 2020-04-01 DIAGNOSIS — M6281 Muscle weakness (generalized): Secondary | ICD-10-CM

## 2020-04-01 DIAGNOSIS — M545 Low back pain: Secondary | ICD-10-CM | POA: Diagnosis not present

## 2020-04-01 DIAGNOSIS — R293 Abnormal posture: Secondary | ICD-10-CM

## 2020-04-01 NOTE — Therapy (Signed)
[facility] [address] [place], [place], [address] Phone: [phone]   Fax:  [phone]  Physical Therapy Treatment  Patient Details  Name: Carrie [name] MRN: [id number] Date of Birth: [birth year] Referring Provider (PT): Jovita Gamma, MD   Encounter Date: 04/01/2020  PT End of Session - 04/01/20 1310    Visit Number  29    Date for PT Re-Evaluation  04/22/20    Authorization Type  KX modifier    PT Start Time  1231    PT Stop Time  1311    PT Time Calculation (min)  40 min    Activity Tolerance  Patient tolerated treatment well    Behavior During Therapy  [facility] for tasks assessed/performed       Past Medical History:  Diagnosis Date  . BPPV (benign paroxysmal positional vertigo), bilateral 04/11/2019  . Colon polyps   . Esophageal spasm   . Fatigue   . Frequent PVCs   . GERD (gastroesophageal reflux disease)   . IBS (irritable bowel syndrome)   . Palpitations   . Recurrent UTI   . Seborrheic dermatitis of scalp 03/06/13  . Vitamin B 12 deficiency     Past Surgical History:  Procedure Laterality Date  . CHOLECYSTECTOMY N/A 08/23/2016   Procedure: LAPAROSCOPIC CHOLECYSTECTOMY;  Surgeon: Rolm Bookbinder, MD;  Location: [facility];  Service: General;  Laterality: N/A;  . COLECTOMY Right 1998   large colonic polyp  . ENDOSCOPIC VEIN LASER TREATMENT Bilateral 2010-2011  . KNEE ARTHROSCOPY W/ MENISCECTOMY Left   . [place]  2001  . RIGHT/LEFT HEART CATH AND CORONARY ANGIOGRAPHY N/A 03/18/2020   Procedure: RIGHT/LEFT HEART CATH AND CORONARY ANGIOGRAPHY;  Surgeon: Jettie Booze, MD;  Location: [place] [facility];  Service: Cardiovascular;  Laterality: N/A;  . SPINE SURGERY     3 since 11/2010, laminectomy, May 2012 lumbar fusion, diskectomy    There were no vitals filed for this visit.  Subjective Assessment - 04/01/20 1233    Subjective  My back is feeling better since last visit.  I saw  the cardiac specialist and he cant treat me for my heart failure because the meds will cause low blood pressure.  I will go to another specialist next week.  I will see a nerulogist next week to address my continued weakness. The MD took me off of Lipitor to see if it is contributing to the weakness    Pertinent History  lumbar surgeries: 11/2010 and 01/2011 discectomy surgeries.  L4-5 lumbar fusion 2012.  Lt knee surgery: 12/18/2018    Currently in Pain?  Yes    Pain Score  3     Pain Location  Back    Pain Orientation  Left    Pain Descriptors / Indicators  Aching;Dull    Pain Type  Chronic pain    Pain Onset  More than a month ago    Pain Frequency  Constant    Aggravating Factors   standing too long, walking    Pain Relieving Factors  soft tissue work, pool walking                       [facility] Adult PT Treatment/Exercise - 04/01/20 0001      Knee/Hip Exercises: Standing   Forward Step Up  Both;1 set;10 reps;Step Height: 2";Step Height: 8"      Manual Therapy   Manual Therapy  Soft tissue mobilization    Manual  therapy comments  applied Biofreeze post session    Soft tissue mobilization  Lt > Rt gluteals and lumbar musculature in LT sidelyingi   Applied Biofreeze at the end, then 10 min of MHP            PT Education - 04/01/20 1247    Education Details  Access Code: [facility], verbal review of all HEP    Person(s) Educated  Patient    Methods  Explanation;Demonstration;Handout    Comprehension  Verbalized understanding;Returned demonstration       PT Short Term Goals - 03/25/20 1055      PT SHORT TERM GOAL #3   Title  demonstrate heel strike bilaterally with gait on level surface > or = to 50% of the time to reduce falls risk    Time  4    Period  Weeks    Status  Achieved    Target Date  01/07/20      PT SHORT TERM GOAL #4   Title  improve endurance to walk for 10-15 minutes without significant fatigue to improve community endurance    Time  4     Period  Weeks    Status  Achieved    Target Date  01/07/20        PT Long Term Goals - 03/25/20 1109      PT LONG TERM GOAL #1   Title  be independent in advanced HEP    Time  4    Period  Weeks    Status  On-going    Target Date  04/24/20      PT LONG TERM GOAL #2   Baseline  knee: flexion 4/5, extension 4+/5 bil    Time  4    Period  Weeks    Status  On-going    Target Date  04/24/20      PT LONG TERM GOAL #3   Title  reduce FOTO to < or = to 47% limitation    Baseline  46% limitation    Status  Achieved      PT LONG TERM GOAL #4   Title  perform regular walking for exercise and verbalize understanding of safe progression    Time  4    Period  Weeks    Status  Achieved      PT LONG TERM GOAL #5   Title  improve LE strength and endurance to walk for 20-25 minutes without limitation    Baseline  depends on endurance related to cardiac condition    Time  4    Period  Weeks    Status  On-going      PT LONG TERM GOAL #6   Title  report a 75% reduction in LBP with standing and walking to improve safety and independepence in the community    Time  8    Period  Weeks    Status  On-going   50%           Plan - 04/01/20 1252    Clinical Impression Statement  Pt continues to undergo testing regarding cardiac condition.  Pt will see another specialist next week.  PT issued step-up exercise to address weakness with negotiating steps.  Pt with improved LBP since last session with significant overall pain reduction.  Pt with improved tension in lumbar paraspinals.  Pt will continue to benefit from skilled PT to improve functional mobility and reduce LBP.    PT Frequency  2x / week  PT Duration  4 weeks    PT Treatment/Interventions  Patient/family education;ADLs/Self Care Home Management;Moist Heat;Electrical Stimulation;Stair training;Gait training;Functional mobility training;Neuromuscular re-education;Balance training;Therapeutic exercise;Therapeutic  activities;Manual techniques;Taping;Dry needling;Passive range of motion    PT Next Visit Plan  manual for lumbar pain, functional mobility training to improve safety and function    PT Home Exercise Plan  Access Code: [facility]    Consulted and Agree with Plan of Care  Patient       Patient will benefit from skilled therapeutic intervention in order to improve the following deficits and impairments:  Abnormal gait, Decreased activity tolerance, Decreased strength, Impaired flexibility, Difficulty walking, Decreased mobility, Decreased endurance, Improper body mechanics, Postural dysfunction, Increased muscle spasms  Visit Diagnosis: Chronic bilateral low back pain without sciatica  Muscle weakness (generalized)  Abnormal posture  Other abnormalities of gait and mobility     Problem List Patient Active Problem List   Diagnosis Date Noted  . Chronic combined systolic and diastolic CHF (congestive heart failure) ([place]) 03/27/2020  . Gait abnormality 10/14/2019  . Mixed hyperlipidemia 07/31/2019  . Vitamin D deficiency disease 05/01/2019  . Chronic renal disease, stage 3, moderately decreased glomerular filtration rate (GFR) between 30-59 mL/min/1.73 square meter 05/01/2019  . History of adenomatous polyp of colon 07/17/2018  . Bladder prolapse, female, acquired 09/21/2017  . IFG (impaired fasting glucose) 10/21/2016  . Gastro-esophageal reflux disease without esophagitis 10/20/2016  . Idiopathic peripheral neuropathy 04/08/2016  . Osteoarthritis of spine with radiculopathy, lumbar region 11/24/2015  . Osteopenia 05/28/2014  . Essential hypertension 03/05/2014  . Irritable bowel syndrome (IBS) 03/27/2013  . Former smoker 03/06/2013  . Seborrheic dermatitis of scalp 03/06/2013  . Allergic rhinitis 01/03/2012  . Symptomatic PVCs 01/03/2012    Sigurd Sos, PT 04/01/20 1:15 PM   [facility] [address] [place], [place], [address] Phone: [phone]   Fax:  [phone]  Name: LORYNN ALAVI MRN: [id number] Date of Birth: [birth year]

## 2020-04-01 NOTE — Telephone Encounter (Signed)
Reaching out to patient to offer assistance regarding upcoming cardiac imaging study; pt verbalizes understanding of appt date/time, parking situation and where to check in, and verified current allergies; name and call back number provided for further questions should they arise Marchia Bond RN Navigator Cardiac Imaging [facility] [phone] office [phone] cell   Pt denies implants other than spinal fusion hardware, denies claustro

## 2020-04-01 NOTE — Patient Instructions (Signed)
Access Code: [facility] URL: [email or web address] Date: 04/01/2020 Prepared by: Rayetta Humphrey Step Up with Counter Support - 2 x daily - 7 x weekly - 1 sets - 10 reps

## 2020-04-02 ENCOUNTER — Other Ambulatory Visit: Payer: Self-pay

## 2020-04-02 ENCOUNTER — Ambulatory Visit (HOSPITAL_COMMUNITY)
Admission: RE | Admit: 2020-04-02 | Discharge: 2020-04-02 | Disposition: A | Discharge status: Home / Self Care | Payer: Medicare Other | Source: Ambulatory Visit | Attending: Internal Medicine | Admitting: Internal Medicine

## 2020-04-02 DIAGNOSIS — I5022 Chronic systolic (congestive) heart failure: Secondary | ICD-10-CM | POA: Insufficient documentation

## 2020-04-02 MED ORDER — GADOBUTROL 1 MMOL/ML IV SOLN
10.0000 mL | Freq: Once | INTRAVENOUS | Status: AC | PRN
  Administered 2020-04-02: 10 mL via INTRAVENOUS

## 2020-04-03 ENCOUNTER — Telehealth: Payer: Self-pay | Admitting: *Deleted

## 2020-04-03 MED ORDER — LOSARTAN POTASSIUM 25 MG PO TABS: 25.0000 mg | ORAL_TABLET | Freq: Every day | ORAL | 1 refills | Status: DC

## 2020-04-03 NOTE — Telephone Encounter (Signed)
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   She has an appt with Dr. Haroldine Laws in mid June.   Lets start Losartan 25 mg a day .  Please her her record her BP daily .

## 2020-04-03 NOTE — Telephone Encounter (Signed)
Pt notified. Will send prescription to [facility] on [address]

## 2020-04-08 ENCOUNTER — Ambulatory Visit: Payer: Medicare Other | Attending: Neurosurgery | Admitting: Physical Therapy

## 2020-04-08 ENCOUNTER — Encounter: Payer: Self-pay | Admitting: Physical Therapy

## 2020-04-08 ENCOUNTER — Other Ambulatory Visit: Payer: Self-pay

## 2020-04-08 DIAGNOSIS — G8929 Other chronic pain: Secondary | ICD-10-CM | POA: Diagnosis not present

## 2020-04-08 DIAGNOSIS — M545 Low back pain: Secondary | ICD-10-CM | POA: Diagnosis not present

## 2020-04-08 DIAGNOSIS — M6281 Muscle weakness (generalized): Secondary | ICD-10-CM | POA: Diagnosis not present

## 2020-04-08 DIAGNOSIS — R293 Abnormal posture: Secondary | ICD-10-CM | POA: Insufficient documentation

## 2020-04-08 DIAGNOSIS — R2689 Other abnormalities of gait and mobility: Secondary | ICD-10-CM | POA: Diagnosis not present

## 2020-04-08 NOTE — Therapy (Addendum)
[facility] [address] [place], [place], [address] Phone: [phone]   Fax:  [phone]  Physical Therapy Treatment  Patient Details  Name: Carrie [name] MRN: [id number] Date of Birth: 1946/08/17 Referring Provider (PT): Jovita Gamma, MD   Encounter Date: 04/08/2020 Progress Note Reporting Period 02/17/20  to 04/08/20  See note below for Objective Data and Assessment of Progress/Goals.   Sigurd Sos, PT 04/08/20 11:48 AM    PT End of Session - 04/08/20 0759    Visit Number  30    Date for PT Re-Evaluation  04/22/20    Authorization Type  KX modifier    Authorization Time Period  11-05-19 -02-02-20    PT Start Time  0759    PT Stop Time  0840    PT Time Calculation (min)  41 min    Activity Tolerance  Patient tolerated treatment well    Behavior During Therapy  [facility] for tasks assessed/performed       Past Medical History:  Diagnosis Date  . BPPV (benign paroxysmal positional vertigo), bilateral 04/11/2019  . Colon polyps   . Esophageal spasm   . Fatigue   . Frequent PVCs   . GERD (gastroesophageal reflux disease)   . IBS (irritable bowel syndrome)   . Palpitations   . Recurrent UTI   . Seborrheic dermatitis of scalp 03/06/13  . Vitamin B 12 deficiency     Past Surgical History:  Procedure Laterality Date  . CHOLECYSTECTOMY N/A 08/23/2016   Procedure: LAPAROSCOPIC CHOLECYSTECTOMY;  Surgeon: Rolm Bookbinder, MD;  Location: [facility];  Service: General;  Laterality: N/A;  . COLECTOMY Right 1998   large colonic polyp  . ENDOSCOPIC VEIN LASER TREATMENT Bilateral 2010-2011  . KNEE ARTHROSCOPY W/ MENISCECTOMY Left   . [place]  2001  . RIGHT/LEFT HEART CATH AND CORONARY ANGIOGRAPHY N/A 03/18/2020   Procedure: RIGHT/LEFT HEART CATH AND CORONARY ANGIOGRAPHY;  Surgeon: Jettie Booze, MD;  Location: [place] [facility];  Service: Cardiovascular;  Laterality: N/A;  . SPINE SURGERY     3 since  11/2010, laminectomy, May 2012 lumbar fusion, diskectomy    There were no vitals filed for this visit.  Subjective Assessment - 04/08/20 0803    Subjective  Had MRI of heart. Has been diagnosed with a congential Lt ventricle disease where the "tissue" is 'thin and not  really healthy." That is why my heart doesn't pump well. Walking everyother day. Continues with the pool  Blood pressure is low this AM per pt.    Pertinent History  lumbar surgeries: 11/2010 and 01/2011 discectomy surgeries.  L4-5 lumbar fusion 2012.  Lt knee surgery: 12/18/2018         [facility] PT Assessment - 04/08/20 0001      Assessment   Medical Diagnosis  lumbar stenosis with neurogenic claudication    Referring Provider (PT)  Jovita Gamma, MD    Onset Date/Surgical Date  12/09/18      Observation/Other Assessments   Focus on Therapeutic Outcomes (FOTO)   48% limitation      Strength   Strength Assessment Site  Hip;Knee    Left Hip Flexion  4/5    Left Knee Extension  4+/5                   [facility] Adult PT Treatment/Exercise - 04/08/20 0001      Lumbar Exercises: Stretches   Single Knee to Chest Stretch  Right;Left;3 reps;20 seconds  Pelvic Tilt  10 reps;5 seconds      Lumbar Exercises: Supine   Straight Leg Raise  --   Bil 6x2, VC for PPT     Manual Therapy   Manual Therapy  Soft tissue mobilization    Soft tissue mobilization  Lt > Rt gluteals and lumbar musculature in LT sidelyingi               PT Short Term Goals - 03/25/20 1055      PT SHORT TERM GOAL #3   Title  demonstrate heel strike bilaterally with gait on level surface > or = to 50% of the time to reduce falls risk    Time  4    Period  Weeks    Status  Achieved    Target Date  01/07/20      PT SHORT TERM GOAL #4   Title  improve endurance to walk for 10-15 minutes without significant fatigue to improve community endurance    Time  4    Period  Weeks    Status  Achieved    Target Date  01/07/20         PT Long Term Goals - 04/08/20 [id number]      PT LONG TERM GOAL #1   Title  be independent in advanced HEP    Time  4    Period  Weeks    Status  On-going      PT LONG TERM GOAL #2   Title  demonstrate t to 4+/5 bil hip and knee strength to improve endurance for community activity    Baseline  knee: flexion 4/5, extension 4+/5 bil    Time  4    Period  Weeks    Status  On-going      PT LONG TERM GOAL #3   Title  reduce FOTO to < or = to 47% limitation    Baseline  46% limitation    Time  8    Period  Weeks    Status  Achieved      PT LONG TERM GOAL #4   Title  perform regular walking for exercise and verbalize understanding of safe progression    Baseline  walked twice yesterday- 6 minutes and then 7-8 minutes    Time  4    Period  Weeks    Status  Achieved      PT LONG TERM GOAL #5   Title  improve LE strength and endurance to walk for 20-25 minutes without limitation    Baseline  depends on endurance related to cardiac condition    Time  4    Period  Weeks    Status  On-going      PT LONG TERM GOAL #6   Title  report a 75% reduction in LBP with standing and walking to improve safety and independepence in the community    Time  8    Period  Weeks    Status  On-going            Plan - 04/08/20 0759    Clinical Impression Statement  Pt had MRI of her heart and was diagnosed with a congenital heart disease that pt reports means she has thin issues of the Lt ventricle. She has plans to follow up with this MD to ask all her questions regarding exercises going forth. Worked on Rt glute > Lt today as they felt more dense than the LT. Pt has plans to walk  after her session today so we focused on streching and gentle hip/core strength.    Personal Factors and Comorbidities  Comorbidity 2    Comorbidities  3 lumbar surgeries, Lt knee surgery    Examination-Activity Limitations  Caring for Others;Carry;Squat;Stand;Stairs;Locomotion Level    Examination-Participation  Restrictions  Community Activity;Meal Prep;Cleaning;Laundry;Shop    Stability/Clinical Decision Making  Evolving/Moderate complexity    Rehab Potential  Good    PT Frequency  2x / week    PT Duration  4 weeks    PT Treatment/Interventions  Patient/family education;ADLs/Self Care Home Management;Moist Heat;Electrical Stimulation;Stair training;Gait training;Functional mobility training;Neuromuscular re-education;Balance training;Therapeutic exercise;Therapeutic activities;Manual techniques;Taping;Dry needling;Passive range of motion    PT Next Visit Plan  manual for lumbar pain, functional mobility training to improve safety and function    PT Home Exercise Plan  Access Code: [facility]    Consulted and Agree with Plan of Care  Patient       Patient will benefit from skilled therapeutic intervention in order to improve the following deficits and impairments:  Abnormal gait, Decreased activity tolerance, Decreased strength, Impaired flexibility, Difficulty walking, Decreased mobility, Decreased endurance, Improper body mechanics, Postural dysfunction, Increased muscle spasms  Visit Diagnosis: Chronic bilateral low back pain without sciatica  Muscle weakness (generalized)  Abnormal posture  Other abnormalities of gait and mobility     Problem List Patient Active Problem List   Diagnosis Date Noted  . Chronic combined systolic and diastolic CHF (congestive heart failure) ([place]) 03/27/2020  . Gait abnormality 10/14/2019  . Mixed hyperlipidemia 07/31/2019  . Vitamin D deficiency disease 05/01/2019  . Chronic renal disease, stage 3, moderately decreased glomerular filtration rate (GFR) between 30-59 mL/min/1.73 square meter 05/01/2019  . History of adenomatous polyp of colon 07/17/2018  . Bladder prolapse, female, acquired 09/21/2017  . IFG (impaired fasting glucose) 10/21/2016  . Gastro-esophageal reflux disease without esophagitis 10/20/2016  . Idiopathic peripheral neuropathy  04/08/2016  . Osteoarthritis of spine with radiculopathy, lumbar region 11/24/2015  . Osteopenia 05/28/2014  . Essential hypertension 03/05/2014  . Irritable bowel syndrome (IBS) 03/27/2013  . Former smoker 03/06/2013  . Seborrheic dermatitis of scalp 03/06/2013  . Allergic rhinitis 01/03/2012  . Symptomatic PVCs 01/03/2012    Myrene Galas, PTA 04/08/20 8:42 AM  [facility] [address], [place], [place], [address] Phone: [phone]   Fax:  [phone]  Name: DNAJA SLEPPY MRN: [id number] Date of Birth: [birth year]

## 2020-04-09 ENCOUNTER — Other Ambulatory Visit: Payer: Self-pay

## 2020-04-09 MED ORDER — DISOPYRAMIDE PHOSPHATE ER 150 MG PO CP12: 150.0000 mg | ORAL_CAPSULE | Freq: Every day | ORAL | 3 refills | Status: DC

## 2020-04-09 NOTE — Telephone Encounter (Signed)
Pt's medication was sent to pt's pharmacy as requested. Confirmation received.  °

## 2020-04-13 ENCOUNTER — Inpatient Hospital Stay (HOSPITAL_COMMUNITY): Admission: RE | Admit: 2020-04-13 | Payer: Medicare Other | Source: Ambulatory Visit

## 2020-04-14 NOTE — Progress Notes (Signed)
[facility]    Provider:  Dr Jaynee Eagles Requesting Provider: Scarlette Calico, MD Primary Care Provider:  Leamon Arnt, MD  CC:  Gait abnormality  04/15/2020: She has back pain, she has stenosis and neurogenic claudication, her left leg is weak and when she stands it is painful and if she walk a long time she gets weak and pain and has to sit down. She has been exercising and going to PT, doing everything she can do, pool walking. She had injections I octoer and December. She has tried tylenol, ibuprofen contraindicated, in the past opioids have helped prn, she has tried lyrica and gabapentin, decadron, diclofenac topical, lidocaine patches, we discussed cymbalta but she does not want to take anything regularly prn, robaxin, mobic,  she just wants something as needed and would prefer prn opioids.Discussed the solution would be decompression surgery.  HPI:  Carrie [name] is a 74 y.o. female here as requested by Leamon Arnt, MD for progressive ataxia.  Past medical history hypertension, hyperlipidemia, former smoker, irritable bowel syndrome, hypoestrogen, B12 deficiency, benign positional vertigo, impaired fasting glucose, peripheral neuropathy, osteoarthritis of the spine with radiculopathy in the lumbar region, chronic renal disease stage III, vitamin D deficiency.In the past year reported worsening dizziness and vertigo, vertigo worsened when she lays down in bed and turns her head to the right, also trouble walking but she thinks is due to chronic knee pain, vertigo is treated with meclizine, chronic numbness in both feet that was previously evaluated by neurologist, also a tremor in her left thumb over the last year.  I did see patient in 2016 for symptoms in her right foot thought it was due to sciatica, tingling in the balls of her feet and some in her toes, her A1c at the time was 6.2-6.4 thought to be due to impaired glucose.  Extensive lab testing was otherwise  unremarkable.  She had knee surgery and numerous problems, she was in bed for 7-8 weeks. When she got up she was weak. She was also having dizziness at the same time. He recommended she see [place] but she thought it was her knee and disuse. She had a +epley and dix-hallpike she sleeps on her left side not on her right side because it makes her dizzy. She still has neuropathy in the feet. Her feet started turning red. She feels she has improved, she is not using a cane, she had surgery in January in the knee and she has stenosis in her back. She can't bike anymore, she is afraid she will fall, she bikes in the house. Over the last improved since surgery but still overall decline in mobility, no falls, no difficulty swallowing, she has dizziness which has been diagnosed as BPPV. No weakness. She feels slower. No other focal neurologic deficits, associated symptoms, inciting events or modifiable factors.  Reviewed notes, labs and imaging from outside physicians, which showed:  Reviewed MRI brain 05/2019 and agree with the following: White matter hyperintensities most commonly indicating chronic ischemic microangiopathy. No acute or reversible intracranial abnormality.  I reviewed Dr. Ronnald Ramp notes.  In the past she is complained of worsening dizziness and vertigo, vertigo worsened when she lays down in bed and turns her head to the right, also trouble walking but she thinks is due to chronic knee pain, vertigo is treated with meclizine, chronic numbness in both feet that was previously evaluated by neurologist, also a tremor in her left thumb over the last year.  I did see  patient in 2016 for symptoms in her right foot thought it was due to sciatica, tingling in the balls of her feet and some in her toes, her A1c at the time was 6.2-6.4 thought to be due to impaired glucose.  Extensive lab testing was otherwise unremarkable.  I reviewed labs which included normal TSH, and in 2016 extensive labs for neuropathy all  unremarkable.  Review of Systems: Patient complains of symptoms per HPI as well as the following symptoms: foot numbness, dragging foot, numbness and tingling, chronic low back. Pertinent negatives and positives per HPI. All others negative   Social History   Socioeconomic History  . Marital status: Married    Spouse name: Not on file  . Number of children: 2  . Years of education: 52  . Highest education level: Not on file  Occupational History  . Occupation: Retired Therapist, sports  Tobacco Use  . Smoking status: Former Smoker    Quit date: 02/02/2013    Years since quitting: 7.2  . Smokeless tobacco: Never Used  Substance and Sexual Activity  . Alcohol use: Yes    Alcohol/week: 3.0 - 5.0 standard drinks    Types: 3 - 5 Glasses of wine per week  . Drug use: No  . Sexual activity: Yes  Other Topics Concern  . Not on file  Social History Narrative   Lives in [place] with spouse at [facility]    Previously Volunteered as a Contractor   Caffeine use: none, quit at least 30 years ago   Social Determinants of Radio broadcast assistant Strain:   . Difficulty of Paying Living Expenses:   Food Insecurity:   . Worried About Charity fundraiser in the Last Year:   . Arboriculturist in the Last Year:   Transportation Needs:   . Film/video editor (Medical):   Marland Kitchen Lack of Transportation (Non-Medical):   Physical Activity:   . Days of Exercise per Week:   . Minutes of Exercise per Session:   Stress:   . Feeling of Stress :   Social Connections:   . Frequency of Communication with Friends and Family:   . Frequency of Social Gatherings with Friends and Family:   . Attends Religious Services:   . Active Member of Clubs or Organizations:   . Attends Archivist Meetings:   Marland Kitchen Marital Status:   Intimate Partner Violence:   . Fear of Current or Ex-Partner:   . Emotionally Abused:   Marland Kitchen Physically Abused:   . Sexually Abused:     Family  History  Problem Relation Age of Onset  . Hypertension Mother   . High Cholesterol Mother   . Cancer Father        head and neck  . Hypertension Sister   . Hypertension Sister   . Diabetes type II Sister        was on steroids   . Hypertension Sister   . Hypertension Sister   . Cancer Sister        bladder  . Early death Neg Hx   . Diabetes Neg Hx   . Heart disease Neg Hx   . Kidney disease Neg Hx   . Stroke Neg Hx   . Breast cancer Neg Hx   . Parkinson's disease Neg Hx     Past Medical History:  Diagnosis Date  . BPPV (benign paroxysmal positional vertigo), bilateral 04/11/2019  . Colon polyps   . Esophageal  spasm   . Fatigue   . Frequent PVCs   . GERD (gastroesophageal reflux disease)   . IBS (irritable bowel syndrome)   . Lumbar stenosis    "around L4,5, somewhere in there"  . 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 Neurogenic claudication   . Palpitations   . Recurrent UTI   . Seborrheic dermatitis of scalp 03/06/13  . Vitamin B 12 deficiency     Patient Active Problem List   Diagnosis Date Noted  . Spinal stenosis of lumbar region with neurogenic claudication 04/15/2020  . Chronic bilateral low back pain with sciatica 04/15/2020  . Chronic combined systolic and diastolic CHF (congestive heart failure) ([place]) 03/27/2020  . Gait abnormality 10/14/2019  . Mixed hyperlipidemia 07/31/2019  . Vitamin D deficiency disease 05/01/2019  . Chronic renal disease, stage 3, moderately decreased glomerular filtration rate (GFR) between 30-59 mL/min/1.73 square meter 05/01/2019  . History of adenomatous polyp of colon 07/17/2018  . Bladder prolapse, female, acquired 09/21/2017  . IFG (impaired fasting glucose) 10/21/2016  . Gastro-esophageal reflux disease without esophagitis 10/20/2016  . Idiopathic peripheral neuropathy 04/08/2016  . Osteoarthritis of spine with radiculopathy, lumbar region 11/24/2015  . Osteopenia 05/28/2014   . Essential hypertension 03/05/2014  . Irritable bowel syndrome (IBS) 03/27/2013  . Former smoker 03/06/2013  . Seborrheic dermatitis of scalp 03/06/2013  . Allergic rhinitis 01/03/2012  . Symptomatic PVCs 01/03/2012    Past Surgical History:  Procedure Laterality Date  . CHOLECYSTECTOMY N/A 08/23/2016   Procedure: LAPAROSCOPIC CHOLECYSTECTOMY;  Surgeon: Rolm Bookbinder, MD;  Location: [facility];  Service: General;  Laterality: N/A;  . COLECTOMY Right 1998   large colonic polyp  . ENDOSCOPIC VEIN LASER TREATMENT Bilateral 2010-2011  . KNEE ARTHROSCOPY W/ MENISCECTOMY Left   . [place]  2001  . RIGHT/LEFT HEART CATH AND CORONARY ANGIOGRAPHY N/A 03/18/2020   Procedure: RIGHT/LEFT HEART CATH AND CORONARY ANGIOGRAPHY;  Surgeon: Jettie Booze, MD;  Location: [place];  Service: Cardiovascular;  Laterality: N/A;  . SPINE SURGERY     3 since 11/2010, laminectomy, May 2012 lumbar fusion, diskectomy    Current Outpatient Medications  Medication Sig Dispense Refill  . acetaminophen (TYLENOL) 500 MG tablet Take 1,000 mg by mouth daily as needed for moderate pain.    Marland Kitchen azelastine (ASTELIN) 0.1 % nasal spray Place 2 sprays into both nostrils 2 (two) times daily. 30 mL 12  . Biotin 1000 MCG tablet Take 2,000 mcg by mouth daily.     . calcium carbonate (TUMS CALCIUM FOR LIFE BONE) 750 MG chewable tablet Chew 750 mg by mouth every evening.     . Cholecalciferol (VITAMIN D3) 2000 units TABS Take 2,000 Units by mouth every evening.     . Ciclopirox 1 % shampoo Apply 1 application topically every other day.     . disopyramide (NORPACE CR) 150 MG 12 hr capsule Take 1 capsule (150 mg total) by mouth daily. 90 capsule 3  . docusate sodium (COLACE) 100 MG capsule Take 100 mg by mouth every evening.     . famotidine (PEPCID) 20 MG tablet Take 20 mg by mouth daily as needed for heartburn or indigestion.    . famotidine (PEPCID) 40 MG tablet Take 1 tablet (40 mg total) by mouth daily  as needed for heartburn or indigestion. 90 tablet 1  . Hyoscyamine Sulfate SL (LEVSIN/SL) 0.125 MG SUBL Place 1 tablet under the tongue every 6 (six)  hours as needed. 120 tablet 0  . losartan (COZAAR) 25 MG tablet Take 1 tablet (25 mg total) by mouth daily. 90 tablet 1  . mometasone (ELOCON) 0.1 % lotion Apply 1 application topically as needed for irritation.    . Multiple Vitamin (MULTIVITAMIN) tablet Take 1 tablet by mouth daily.    . sodium chloride (MURO 128) 2 % ophthalmic solution Place 1 drop into both eyes at bedtime.     . tretinoin (RETIN-A) 0.025 % cream Apply 1 application topically daily as needed (skin bumps).   0   No current facility-administered medications for this visit.    Allergies as of 04/15/2020 - Review Complete 04/15/2020  Allergen Reaction Noted  . Phenergan [promethazine hcl] Shortness Of Breath and Other (See Comments) 08/22/2016  . Erythromycin Diarrhea and Nausea And Vomiting 12/25/2013  . Azithromycin Other (See Comments) 12/25/2013  . Epinephrine Other (See Comments) 12/25/2013  . Nitrofurantoin Nausea Only and Other (See Comments) 12/25/2013    Vitals: BP (!) 162/86 (BP Location: Right Arm, Patient Position: Sitting) Comment: pt states its normally 130s/82. has been as low as 80s/50s.  Pulse 86   Temp (!) 97.3 F (36.3 C) Comment: taken at front  Ht 5' 3.5" (1.613 m)   Wt 186 lb (84.4 kg)   BMI 32.43 kg/m  Last Weight:  Wt Readings from Last 1 Encounters:  04/15/20 186 lb (84.4 kg)   Last Height:   Ht Readings from Last 1 Encounters:  04/15/20 5' 3.5" (1.613 m)   Exam Stable  Physical exam: Exam: Gen: NAD, conversant, well nourised, obese, well groomed                     Eyes: Conjunctivae clear without exudates or hemorrhage  Neuro: Detailed Neurologic Exam  Speech:    Speech is normal; fluent and spontaneous with normal comprehension.  Cognition:    The patient is oriented to person, place, and time;     recent and remote memory  intact;     language fluent;     normal attention, concentration,     fund of knowledge Cranial Nerves: hypomimia.    The pupils are equal, round, and reactive to light.  Saccadic pursuit. Pupils too small to visual fundi. Visual fields are full to finger confrontation. Extraocular movements are intact. Trigeminal sensation is intact and the muscles of mastication are normal. Slight ptosis on the left otherwise the face is symmetric. The palate elevates in the midline. Hearing intact. Voice is normal. Shoulder shrug is normal. The tongue has normal motion without fasciculations.   Coordination:    No dysmetria, bradykinesia  Gait:    Slightly decreased arm swing in the right arm as compared to the left  Motor Observation:    Postural tremor on the right Tone:    Normal muscle tone.  No cogwheeling  Posture:    Slightly bent at the waist    Strength:    Strength is V/V in the upper and lower limbs.      Sensation: intact to LT, proprioception intact in the great toes.      Reflex Exam:  DTR's:    Deep tendon reflexes in the upper and lower extremities are trace AJs, 1+ patellars, 2+ biceps bilaterally.   Toes:    The toes are equivocal bilaterally.   Clonus:    Clonus is absent.    Assessment/Plan:   74 y.o. female here as requested by Leamon Arnt, MD for gait  abnormality and low back pain with stenosis and neurogenic claudication.  Past medical history hypertension, hyperlipidemia, former smoker, irritable bowel syndrome, hypoestrogen, B12 deficiency, benign positional vertigo, impaired fasting glucose, peripheral neuropathy, osteoarthritis of the spine with radiculopathy in the lumbar region, chronic renal disease stage III, vitamin D deficiency.  - Lumbar spinal stenosis with neurogenic claudication: the best solution is decompressive surgery.   She has been exercising and going to PT, doing everything she can do, pool walking. She had injections in October and December.  She has tried tylenol, ibuprofen contraindicated, in the past opioids have helped prn, she has tried lyrica and gabapentin, decadron, diclofenac topical, lidocaine patches, we discussed cymbalta but she does not want to take anything regularly, robaxin, mobic,  she just wants something as needed and would prefer prn opioids. Will refer to pain clinic.   - She has parkinsonism I suspect PD, she declines a DAT scan and she would rather discuss it in 3 months after her trip  - otherwise Gait abnormality likely multifactorial including osteoarthritis in the knees and other multiple joints, lumbar osteoarthritis with radiculopathy, peripheral neuropathy for which she was evaluated in the past and likely pre-diabetic/diabetic nothing else found  - BPPV: vestibular therapy  - we will follow and suggest DAT Scan in the future.   Orders Placed This Encounter  Procedures  . Ambulatory referral to Pain Clinic    Cc: Leamon Arnt, MD,   Scarlette Calico, MD  Sarina Ill, MD  [facility] [address] [place], [place] [address]  Phone [phone] Fax [phone]  I spent 25 minutes of face-to-face and non-face-to-face time with patient on the  1. Spinal stenosis of lumbar region with neurogenic claudication   2. Chronic bilateral low back pain with sciatica, sciatica laterality unspecified   3. Parkinsonism, unspecified Parkinsonism type ([place])    diagnosis.  This included previsit chart review, lab review, study review, order entry, electronic health record documentation, patient education on the different diagnostic and therapeutic options, counseling and coordination of care, risks and benefits of management, compliance, or risk factor reduction

## 2020-04-15 ENCOUNTER — Other Ambulatory Visit: Payer: Self-pay

## 2020-04-15 ENCOUNTER — Ambulatory Visit (INDEPENDENT_AMBULATORY_CARE_PROVIDER_SITE_OTHER): Payer: Medicare Other | Admitting: Neurology

## 2020-04-15 ENCOUNTER — Encounter: Payer: Self-pay | Admitting: Neurology

## 2020-04-15 ENCOUNTER — Ambulatory Visit: Payer: Medicare Other | Admitting: Physical Therapy

## 2020-04-15 ENCOUNTER — Encounter: Payer: Self-pay | Admitting: Physical Therapy

## 2020-04-15 VITALS — BP 162/86 | HR 86 | Temp 97.3°F | Ht 63.5 in | Wt 186.0 lb

## 2020-04-15 DIAGNOSIS — R2689 Other abnormalities of gait and mobility: Secondary | ICD-10-CM | POA: Diagnosis not present

## 2020-04-15 DIAGNOSIS — R293 Abnormal posture: Secondary | ICD-10-CM

## 2020-04-15 DIAGNOSIS — M545 Low back pain, unspecified: Secondary | ICD-10-CM

## 2020-04-15 DIAGNOSIS — M6281 Muscle weakness (generalized): Secondary | ICD-10-CM | POA: Diagnosis not present

## 2020-04-15 DIAGNOSIS — M5440 Lumbago with sciatica, unspecified side: Secondary | ICD-10-CM | POA: Diagnosis not present

## 2020-04-15 DIAGNOSIS — G8929 Other chronic pain: Secondary | ICD-10-CM

## 2020-04-15 DIAGNOSIS — M48062 Spinal stenosis, lumbar region with neurogenic claudication: Secondary | ICD-10-CM

## 2020-04-15 DIAGNOSIS — G20 Parkinson's disease: Secondary | ICD-10-CM | POA: Diagnosis not present

## 2020-04-15 NOTE — Therapy (Signed)
[facility] [address], [place], [place], [address] Phone: [phone]   Fax:  [phone]  Physical Therapy Treatment  Patient Details  Name: Carrie [name] MRN: [id number] Date of Birth: [birth year] Referring Provider (PT): Jovita Gamma, MD   Encounter Date: 04/15/2020  PT End of Session - 04/15/20 1149    Visit Number  31    Date for PT Re-Evaluation  04/22/20    Authorization Type  KX modifier    Authorization Time Period  11-05-19 -02-02-20    PT Start Time  1145    PT Stop Time  1225    PT Time Calculation (min)  40 min    Activity Tolerance  Patient tolerated treatment well    Behavior During Therapy  [facility] for tasks assessed/performed       Past Medical History:  Diagnosis Date  . BPPV (benign paroxysmal positional vertigo), bilateral 04/11/2019  . Colon polyps   . Esophageal spasm   . Fatigue   . Frequent PVCs   . GERD (gastroesophageal reflux disease)   . IBS (irritable bowel syndrome)   . Palpitations   . Recurrent UTI   . Seborrheic dermatitis of scalp 03/06/13  . Vitamin B 12 deficiency     Past Surgical History:  Procedure Laterality Date  . CHOLECYSTECTOMY N/A 08/23/2016   Procedure: LAPAROSCOPIC CHOLECYSTECTOMY;  Surgeon: Rolm Bookbinder, MD;  Location: [facility];  Service: General;  Laterality: N/A;  . COLECTOMY Right 1998   large colonic polyp  . ENDOSCOPIC VEIN LASER TREATMENT Bilateral 2010-2011  . KNEE ARTHROSCOPY W/ MENISCECTOMY Left   . [place]  2001  . RIGHT/LEFT HEART CATH AND CORONARY ANGIOGRAPHY N/A 03/18/2020   Procedure: RIGHT/LEFT HEART CATH AND CORONARY ANGIOGRAPHY;  Surgeon: Jettie Booze, MD;  Location: [facility];  Service: Cardiovascular;  Laterality: N/A;  . SPINE SURGERY     3 since 11/2010, laminectomy, May 2012 lumbar fusion, diskectomy    There were no vitals filed for this visit.  Subjective Assessment - 04/15/20 1150    Subjective  Back  still most problematic when walking, better when sitting. Seeing the neurologist this afternoon.    Pertinent History  lumbar surgeries: 11/2010 and 01/2011 discectomy surgeries.  L4-5 lumbar fusion 2012.  Lt knee surgery: 12/18/2018    Currently in Pain?  No/denies    Multiple Pain Sites  No                        [facility] Adult PT Treatment/Exercise - 04/15/20 0001      Lumbar Exercises: Stretches   Single Knee to Chest Stretch  Right;Left;1 rep;30 seconds    Pelvic Tilt  10 reps;5 seconds      Lumbar Exercises: Supine   Glut Set  --   ball, pelvic tilt, glute cocontractions 3 sec hold 2x10   Straight Leg Raise  10 reps    Straight Leg Raises Limitations  LT 2x10, Rt 10x      Knee/Hip Exercises: Supine   Short Arc Quad Sets  Strengthening;Both;1 set;10 reps   2 sets on [employer] Limitations  2#      Manual Therapy   Manual Therapy  Soft tissue mobilization    Soft tissue mobilization  Lt > Rt gluteals and lumbar musculature in LT sidelyingi               PT Short Term Goals -  03/25/20 1055      PT SHORT TERM GOAL #3   Title  demonstrate heel strike bilaterally with gait on level surface > or = to 50% of the time to reduce falls risk    Time  4    Period  Weeks    Status  Achieved    Target Date  01/07/20      PT SHORT TERM GOAL #4   Title  improve endurance to walk for 10-15 minutes without significant fatigue to improve community endurance    Time  4    Period  Weeks    Status  Achieved    Target Date  01/07/20        PT Long Term Goals - 04/08/20 [id number]      PT LONG TERM GOAL #1   Title  be independent in advanced HEP    Time  4    Period  Weeks    Status  On-going      PT LONG TERM GOAL #2   Title  demonstrate t to 4+/5 bil hip and knee strength to improve endurance for community activity    Baseline  knee: flexion 4/5, extension 4+/5 bil    Time  4    Period  Weeks    Status  On-going      PT LONG TERM GOAL #3    Title  reduce FOTO to < or = to 47% limitation    Baseline  46% limitation    Time  8    Period  Weeks    Status  Achieved      PT LONG TERM GOAL #4   Title  perform regular walking for exercise and verbalize understanding of safe progression    Baseline  walked twice yesterday- 6 minutes and then 7-8 minutes    Time  4    Period  Weeks    Status  Achieved      PT LONG TERM GOAL #5   Title  improve LE strength and endurance to walk for 20-25 minutes without limitation    Baseline  depends on endurance related to cardiac condition    Time  4    Period  Weeks    Status  On-going      PT LONG TERM GOAL #6   Title  report a 75% reduction in LBP with standing and walking to improve safety and independepence in the community    Time  8    Period  Weeks    Status  On-going            Plan - 04/15/20 1149    Clinical Impression Statement  Pt feels taking her new heart medicine has helped regulate her blood pressure more consistently. Pt still complaints of her back pain when walking and standing, it pretty much abolishes with sitting/reclining. Pt tolerated a majority supine core and LE strengthening routine today without increasing pain in her back.    Personal Factors and Comorbidities  Comorbidity 2    Comorbidities  3 lumbar surgeries, Lt knee surgery    Examination-Activity Limitations  Caring for Others;Carry;Squat;Stand;Stairs;Locomotion Level    Examination-Participation Restrictions  Community Activity;Meal Prep;Cleaning;Laundry;Shop    Stability/Clinical Decision Making  Evolving/Moderate complexity    PT Frequency  2x / week    PT Duration  4 weeks    PT Treatment/Interventions  Patient/family education;ADLs/Self Care Home Management;Moist Heat;Electrical Stimulation;Stair training;Gait training;Functional mobility training;Neuromuscular re-education;Balance training;Therapeutic exercise;Therapeutic activities;Manual techniques;Taping;Dry needling;Passive range of motion  PT Next Visit Plan  Finalize HEP in prep for discharge, pt sees the neurologist this Afternoon.    PT Home Exercise Plan  Access Code: [facility]    Consulted and Agree with Plan of Care  Patient       Patient will benefit from skilled therapeutic intervention in order to improve the following deficits and impairments:  Abnormal gait, Decreased activity tolerance, Decreased strength, Impaired flexibility, Difficulty walking, Decreased mobility, Decreased endurance, Improper body mechanics, Postural dysfunction, Increased muscle spasms  Visit Diagnosis: Chronic bilateral low back pain without sciatica  Muscle weakness (generalized)  Abnormal posture  Other abnormalities of gait and mobility     Problem List Patient Active Problem List   Diagnosis Date Noted  . Chronic combined systolic and diastolic CHF (congestive heart failure) ([place]) 03/27/2020  . Gait abnormality 10/14/2019  . Mixed hyperlipidemia 07/31/2019  . Vitamin D deficiency disease 05/01/2019  . Chronic renal disease, stage 3, moderately decreased glomerular filtration rate (GFR) between 30-59 mL/min/1.73 square meter 05/01/2019  . History of adenomatous polyp of colon 07/17/2018  . Bladder prolapse, female, acquired 09/21/2017  . IFG (impaired fasting glucose) 10/21/2016  . Gastro-esophageal reflux disease without esophagitis 10/20/2016  . Idiopathic peripheral neuropathy 04/08/2016  . Osteoarthritis of spine with radiculopathy, lumbar region 11/24/2015  . Osteopenia 05/28/2014  . Essential hypertension 03/05/2014  . Irritable bowel syndrome (IBS) 03/27/2013  . Former smoker 03/06/2013  . Seborrheic dermatitis of scalp 03/06/2013  . Allergic rhinitis 01/03/2012  . Symptomatic PVCs 01/03/2012    Kodee Drury, PTA 04/15/2020, 12:24 PM  [facility] [address] [place], [place], [address] Phone: [phone]   Fax:  [phone]  Name: Carrie [name] MRN: [id number] Date of Birth: 05/08/1946

## 2020-04-16 ENCOUNTER — Ambulatory Visit (INDEPENDENT_AMBULATORY_CARE_PROVIDER_SITE_OTHER): Payer: Medicare Other | Admitting: Family Medicine

## 2020-04-16 ENCOUNTER — Encounter: Payer: Self-pay | Admitting: Family Medicine

## 2020-04-16 VITALS — BP 118/70 | HR 95 | Temp 98.0°F | Resp 16 | Ht 64.0 in | Wt 185.6 lb

## 2020-04-16 DIAGNOSIS — I10 Essential (primary) hypertension: Secondary | ICD-10-CM | POA: Diagnosis not present

## 2020-04-16 DIAGNOSIS — R7301 Impaired fasting glucose: Secondary | ICD-10-CM

## 2020-04-16 DIAGNOSIS — R269 Unspecified abnormalities of gait and mobility: Secondary | ICD-10-CM

## 2020-04-16 DIAGNOSIS — M48062 Spinal stenosis, lumbar region with neurogenic claudication: Secondary | ICD-10-CM | POA: Diagnosis not present

## 2020-04-16 DIAGNOSIS — E782 Mixed hyperlipidemia: Secondary | ICD-10-CM

## 2020-04-16 DIAGNOSIS — I5042 Chronic combined systolic (congestive) and diastolic (congestive) heart failure: Secondary | ICD-10-CM

## 2020-04-16 LAB — POCT GLYCOSYLATED HEMOGLOBIN (HGB A1C): Hemoglobin A1C: 5.7 % — AB (ref 4.0–5.6)

## 2020-04-16 MED ORDER — HYDROCODONE-ACETAMINOPHEN 5-325 MG PO TABS: 1.0000 | ORAL_TABLET | Freq: Two times a day (BID) | ORAL | 0 refills | Status: DC | PRN

## 2020-04-16 NOTE — Patient Instructions (Signed)
Please return in 4-6 months for your annual complete physical; please come fasting.   If you have any questions or concerns, please don't hesitate to send me a message via MyChart or call the office at [phone]. Thank you for visiting with [place] today! It's our pleasure caring for you.

## 2020-04-16 NOTE — Progress Notes (Signed)
Subjective  CC:  Chief Complaint  Patient presents with  . Back Pain    Dr. Jaynee Eagles is referring patient to pain clinic   . Congestive Heart Failure    started Losartan 25 mg daily, June 21st establishing with heart failure clinic    HPI: Carrie [name] is a 74 y.o. female who presents to the office today to address the problems listed above in the chief complaint.  Hypertension f/u: Control is good but has intermittent orthostatic hypotension. Pt reports she is doing ok. Reviewed notes from cardiology for progressive CHF being referred to advanced CHF clinic for further mgt recs. No CAD by cath. . Stopped statin to see if helps mm weakness. She denies adverse effects from his BP medications. Compliance with medication is good.   Neuro eval: lumbar stenosis: defers surgery for now; has tried all other therapies. Needs pain control; uses intermittent nacotics in the past. Understands risks vs benefits. Also considering dx of Parkinson's and pt will be formally tested in a few months after she competes her travel. A handicapped placard form was completed for her in the interim.   H/o IFG w/o sxs of hyperglycemia.  Lab Results  Component Value Date   HGBA1C 5.7 (A) 04/16/2020   HGBA1C 6.0 (H) 10/14/2019     Assessment  1. Essential hypertension   2. IFG (impaired fasting glucose)   3. Mixed hyperlipidemia   4. Gait abnormality   5. Chronic combined systolic and diastolic CHF (congestive heart failure) ([place])   6. Spinal stenosis of lumbar region with neurogenic claudication      Plan    Hypertension f/u: BP control is well controlled. But due to variability: limited CHF treatment options. To see specialist.  Neuro: ? PD.   Lumbar stenosis with chronic back pain. Elect intermittent chronic narcotic treatments for now. Risks/benefits and fall risks discussed  Hyperlipidemia f/u: off statin now. Education regarding management of these chronic disease states was given. Management  strategies discussed on successive visits include dietary and exercise recommendations, goals of achieving and maintaining IBW, and lifestyle modifications aiming for adequate sleep and minimizing stressors.   Follow up: Return in about 5 months (around 09/16/2020) for complete physical.  Orders Placed This Encounter  Procedures  . POCT HgB A1C   Meds ordered this encounter  Medications  . DISCONTD: HYDROcodone-acetaminophen (NORCO) 5-325 MG tablet    Sig: Take 1 tablet by mouth 2 (two) times daily as needed for moderate pain.    Dispense:  60 tablet    Refill:  0  . HYDROcodone-acetaminophen (NORCO) 5-325 MG tablet    Sig: Take 1 tablet by mouth 2 (two) times daily as needed for moderate pain.    Dispense:  60 tablet    Refill:  0      BP Readings from Last 3 Encounters:  04/16/20 118/70  04/15/20 (!) 162/86  03/27/20 132/80   Wt Readings from Last 3 Encounters:  04/16/20 185 lb 9.6 oz (84.2 kg)  04/15/20 186 lb (84.4 kg)  03/27/20 187 lb (84.8 kg)    Lab Results  Component Value Date   CHOL 180 07/31/2019   CHOL 201 (H) 07/25/2018   CHOL 187 03/08/2017   Lab Results  Component Value Date   HDL 67.40 07/31/2019   HDL 75 07/25/2018   HDL 72 03/08/2017   Lab Results  Component Value Date   LDLCALC 87 07/31/2019   LDLCALC 109 (H) 07/25/2018   LDLCALC 94 03/08/2017  Lab Results  Component Value Date   TRIG 125.0 07/31/2019   TRIG 83 07/25/2018   TRIG 105 03/08/2017   Lab Results  Component Value Date   CHOLHDL 3 07/31/2019   CHOLHDL 2.7 07/25/2018   CHOLHDL 2.6 03/08/2017   No results found for: LDLDIRECT Lab Results  Component Value Date   CREATININE 0.97 03/16/2020   BUN 17 03/16/2020   NA 143 03/18/2020   K 4.1 03/18/2020   CL 104 03/16/2020   CO2 28 03/16/2020    The 10-year ASCVD risk score Mikey Bussing DC Jr., et al., 2013) is: 18.1%   Values used to calculate the score:     Age: 26 years     Sex: Female     Is Non-Hispanic African American:  No     Diabetic: No     Tobacco smoker: No     Systolic Blood Pressure: 123456 mmHg     Is BP treated: Yes     HDL Cholesterol: 67.4 mg/dL     Total Cholesterol: 180 mg/dL  I reviewed the patients updated PMH, FH, and SocHx.    Patient Active Problem List   Diagnosis Date Noted  . Spinal stenosis of lumbar region with neurogenic claudication 04/15/2020    Priority: High  . Chronic bilateral low back pain with sciatica 04/15/2020    Priority: High  . Chronic combined systolic and diastolic CHF (congestive heart failure) ([place]) 03/27/2020    Priority: High  . Gait abnormality 10/14/2019    Priority: High  . Mixed hyperlipidemia 07/31/2019    Priority: High  . History of adenomatous polyp of colon 07/17/2018    Priority: High  . IFG (impaired fasting glucose) 10/21/2016    Priority: High  . Idiopathic peripheral neuropathy 04/08/2016    Priority: High  . Essential hypertension 03/05/2014    Priority: High  . Chronic renal disease, stage 3, moderately decreased glomerular filtration rate (GFR) between 30-59 mL/min/1.73 square meter 05/01/2019    Priority: Medium  . Gastro-esophageal reflux disease without esophagitis 10/20/2016    Priority: Medium  . Osteoarthritis of spine with radiculopathy, lumbar region 11/24/2015    Priority: Medium  . Osteopenia 05/28/2014    Priority: Medium  . Irritable bowel syndrome (IBS) 03/27/2013    Priority: Medium  . Former smoker 03/06/2013    Priority: Medium  . Symptomatic PVCs 01/03/2012    Priority: Medium  . Vitamin D deficiency disease 05/01/2019    Priority: Low  . Bladder prolapse, female, acquired 09/21/2017    Priority: Low  . Seborrheic dermatitis of scalp 03/06/2013    Priority: Low  . Allergic rhinitis 01/03/2012    Priority: Low    Allergies: Phenergan [promethazine hcl], Erythromycin, Azithromycin, Epinephrine, and Nitrofurantoin  Social History: Patient  reports that she quit smoking about 7 years ago. She has never  used smokeless tobacco. She reports current alcohol use of about 3.0 - 5.0 standard drinks of alcohol per week. She reports that she does not use drugs.  Current Meds  Medication Sig  . acetaminophen (TYLENOL) 500 MG tablet Take 1,000 mg by mouth daily as needed for moderate pain.  Marland Kitchen azelastine (ASTELIN) 0.1 % nasal spray Place 2 sprays into both nostrils 2 (two) times daily.  . Biotin 1000 MCG tablet Take 2,000 mcg by mouth daily.   . calcium carbonate (TUMS CALCIUM FOR LIFE BONE) 750 MG chewable tablet Chew 750 mg by mouth every evening.   . Cholecalciferol (VITAMIN D3) 2000 units TABS Take 2,000  Units by mouth every evening.   . Ciclopirox 1 % shampoo Apply 1 application topically every other day.   . disopyramide (NORPACE CR) 150 MG 12 hr capsule Take 1 capsule (150 mg total) by mouth daily.  Marland Kitchen docusate sodium (COLACE) 100 MG capsule Take 100 mg by mouth every evening.   . famotidine (PEPCID) 20 MG tablet Take 20 mg by mouth daily as needed for heartburn or indigestion.  . famotidine (PEPCID) 40 MG tablet Take 1 tablet (40 mg total) by mouth daily as needed for heartburn or indigestion.  Marland Kitchen Hyoscyamine Sulfate SL (LEVSIN/SL) 0.125 MG SUBL Place 1 tablet under the tongue every 6 (six) hours as needed.  Marland Kitchen losartan (COZAAR) 25 MG tablet Take 1 tablet (25 mg total) by mouth daily.  . mometasone (ELOCON) 0.1 % lotion Apply 1 application topically as needed for irritation.  . Multiple Vitamin (MULTIVITAMIN) tablet Take 1 tablet by mouth daily.  . sodium chloride (MURO 128) 2 % ophthalmic solution Place 1 drop into both eyes at bedtime.   . tretinoin (RETIN-A) 0.025 % cream Apply 1 application topically daily as needed (skin bumps).     Review of Systems: Cardiovascular: negative for chest pain, palpitations, leg swelling, orthopnea Respiratory: negative for SOB, wheezing or persistent cough Gastrointestinal: negative for abdominal pain Genitourinary: negative for dysuria or gross hematuria   Objective  Vitals: BP 118/70   Pulse 95   Temp 98 F (36.7 C) (Temporal)   Resp 16   Ht 5\' 4"  (1.626 m)   Wt 185 lb 9.6 oz (84.2 kg)   SpO2 99%   BMI 31.86 kg/m  General: no acute distress  Psych:  Alert and oriented, normal mood and affect HEENT:  Normocephalic, atraumatic, supple neck  Cardiovascular:  RRR without murmur. no edema Respiratory:  Good breath sounds bilaterally, CTAB with normal respiratory effort Skin:  Warm, no rashes Neurologic:   Mental status is normal  Commons side effects, risks, benefits, and alternatives for medications and treatment plan prescribed today were discussed, and the patient expressed understanding of the given instructions. Patient is instructed to call or message via MyChart if he/she has any questions or concerns regarding our treatment plan. No barriers to understanding were identified. We discussed Red Flag symptoms and signs in detail. Patient expressed understanding regarding what to do in case of urgent or emergency type symptoms.   Medication list was reconciled, printed and provided to the patient in AVS. Patient instructions and summary information was reviewed with the patient as documented in the AVS. This note was prepared with assistance of Dragon voice recognition software. Occasional wrong-word or sound-a-like substitutions may have occurred due to the inherent limitations of voice recognition software  This visit occurred during the SARS-CoV-2 public health emergency.  Safety protocols were in place, including screening questions prior to the visit, additional usage of staff PPE, and extensive cleaning of exam room while observing appropriate contact time as indicated for disinfecting solutions.

## 2020-04-22 ENCOUNTER — Ambulatory Visit: Payer: Medicare Other | Admitting: Physical Therapy

## 2020-04-22 ENCOUNTER — Other Ambulatory Visit: Payer: Self-pay

## 2020-04-22 ENCOUNTER — Encounter: Payer: Self-pay | Admitting: Physical Therapy

## 2020-04-22 DIAGNOSIS — M6281 Muscle weakness (generalized): Secondary | ICD-10-CM

## 2020-04-22 DIAGNOSIS — G8929 Other chronic pain: Secondary | ICD-10-CM

## 2020-04-22 DIAGNOSIS — R293 Abnormal posture: Secondary | ICD-10-CM

## 2020-04-22 DIAGNOSIS — R2689 Other abnormalities of gait and mobility: Secondary | ICD-10-CM

## 2020-04-22 DIAGNOSIS — M545 Low back pain: Secondary | ICD-10-CM | POA: Diagnosis not present

## 2020-04-22 NOTE — Therapy (Addendum)
[facility] [address] [place], [place], [address] Phone: [phone]   Fax:  [phone]  Physical Therapy Treatment  Patient Details  Name: Carrie [name] MRN: [id number] Date of Birth: 03-10-1946 Referring Provider (PT): Jovita Gamma, MD   Encounter Date: 04/22/2020  PT End of Session - 04/22/20 1158    Visit Number  31    Date for PT Re-Evaluation  04/22/20    Authorization Type  KX modifier    Authorization Time Period  11-05-19 -02-02-20    PT Start Time  1145    PT Stop Time  1223    PT Time Calculation (min)  38 min    Activity Tolerance  Patient tolerated treatment well    Behavior During Therapy  [facility] for tasks assessed/performed       Past Medical History:  Diagnosis Date  . BPPV (benign paroxysmal positional vertigo), bilateral 04/11/2019  . CHF (congestive heart failure) ([place])   . Colon polyps   . Esophageal spasm   . Fatigue   . Frequent PVCs   . GERD (gastroesophageal reflux disease)   . IBS (irritable bowel syndrome)   . Lumbar stenosis    "around L4,5, somewhere in there"  . 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 Neurogenic claudication   . Palpitations   . Recurrent UTI   . Seborrheic dermatitis of scalp 03/06/13  . Vitamin B 12 deficiency     Past Surgical History:  Procedure Laterality Date  . CHOLECYSTECTOMY N/A 08/23/2016   Procedure: LAPAROSCOPIC CHOLECYSTECTOMY;  Surgeon: Rolm Bookbinder, MD;  Location: [facility];  Service: General;  Laterality: N/A;  . COLECTOMY Right 1998   large colonic polyp  . ENDOSCOPIC VEIN LASER TREATMENT Bilateral 2010-2011  . KNEE ARTHROSCOPY W/ MENISCECTOMY Left   . [place]  2001  . RIGHT/LEFT HEART CATH AND CORONARY ANGIOGRAPHY N/A 03/18/2020   Procedure: RIGHT/LEFT HEART CATH AND CORONARY ANGIOGRAPHY;  Surgeon: Jettie Booze, MD;  Location: [facility];  Service: Cardiovascular;   Laterality: N/A;  . SPINE SURGERY     3 since 11/2010, laminectomy, May 2012 lumbar fusion, diskectomy    There were no vitals filed for this visit.  Subjective Assessment - 04/22/20 1151    Subjective  I saw the neurologist. I have significant stenosis and i will be worked up to rule out Parkinsons. I actually had 3-4 days of no back pain.    Pertinent History  lumbar surgeries: 11/2010 and 01/2011 discectomy surgeries.  L4-5 lumbar fusion 2012.  Lt knee surgery: 12/18/2018    Pain Score  3     Pain Location  Back    Pain Orientation  Left    Pain Descriptors / Indicators  Dull;Aching    Aggravating Factors   Standing and walking    Pain Relieving Factors  soft tissue work, sitting down, heat    Multiple Pain Sites  No         [facility] PT Assessment - 04/22/20 0001      Assessment   Medical Diagnosis  lumbar stenosis with neurogenic claudication    Referring Provider (PT)  Jovita Gamma, MD    Onset Date/Surgical Date  12/09/18      Observation/Other Assessments   Focus on Therapeutic Outcomes (FOTO)   46% limitation      Strength   Strength Assessment Site  Hip;Knee    Left Hip Flexion  4/5    Left Knee Extension  4+/5                    [facility] Adult PT Treatment/Exercise - 04/22/20 0001      Self-Care   Other Self-Care Comments   Discussed reducing the time she walks in the pool to 20 min. pt feels shorter duration will result in less low back pain the next day.       Lumbar Exercises: Stretches   Single Knee to Chest Stretch  Right;Left;1 rep;30 seconds    Pelvic Tilt  10 reps;5 seconds      Lumbar Exercises: Supine   Straight Leg Raise  10 reps    Straight Leg Raises Limitations  LT 2x10, Rt 10x      Manual Therapy   Manual Therapy  Soft tissue mobilization    Soft tissue mobilization  Lt > Rt gluteals and lumbar musculature in LT sidelyingi               PT Short Term Goals - 04/22/20 1154      PT SHORT TERM GOAL #3   Title   demonstrate heel strike bilaterally with gait on level surface > or = to 50% of the time to reduce falls risk    Time  4    Period  Weeks    Status  Achieved    Target Date  01/07/20      PT SHORT TERM GOAL #4   Title  improve endurance to walk for 10-15 minutes without significant fatigue to improve community endurance    Time  4    Period  Weeks    Status  Achieved   40 min but slow and uses poles   Target Date  01/07/20        PT Long Term Goals - 04/22/20 1155      PT LONG TERM GOAL #1   Title  be independent in advanced HEP    Time  4    Period  Weeks    Status  Achieved      PT LONG TERM GOAL #2   Title  demonstrate t to 4+/5 bil hip and knee strength to improve endurance for community activity    Baseline  knee: flexion 4/5, extension 4+/5 bil    Status  Partially Met      PT LONG TERM GOAL #3   Title  reduce FOTO to < or = to 47% limitation    Baseline  46% limitation    Status  Achieved      PT LONG TERM GOAL #4   Title  perform regular walking for exercise and verbalize understanding of safe progression    Time  4    Status  Achieved   Everyother day outdoor walking, everyother day pool walking     PT LONG TERM GOAL #5   Title  improve LE strength and endurance to walk for 20-25 minutes without limitation    Time  4    Period  Weeks    Status  Achieved   40 min, slow and with with poles     PT LONG TERM GOAL #6   Title  report a 75% reduction in LBP with standing and walking to improve safety and independepence in the community    Time  8    Period  Weeks    Status  --   walking 70%, standing 50%  Plan - 04/22/20 1223    Clinical Impression Statement  Pt will be discharged to HEP.  LBP is overall improved and is being active at home.  Pt is undergoing many tests to address chronic symptoms and will continue to pursue this after discharge.  Pt will follow-up with MD to discuss further deficits.    Personal Factors and Comorbidities  --     Comorbidities  3 lumbar surgeries, Lt knee surgery    Examination-Activity Limitations  --    Examination-Participation Restrictions  --    Stability/Clinical Decision Making  --    Rehab Potential  --    PT Frequency  --    PT Duration  --    PT Treatment/Interventions  Patient/family education;ADLs/Self Care Home Management;Moist Heat;Electrical Stimulation;Stair training;Gait training;Functional mobility training;Neuromuscular re-education;Balance training;Therapeutic exercise;Therapeutic activities;Manual techniques;Taping;Dry needling;Passive range of motion    PT Next Visit Plan  Discharge to HEP    PT Home Exercise Plan  Access Code: [facility]    Consulted and Agree with Plan of Care  Patient       Patient will benefit from skilled therapeutic intervention in order to improve the following deficits and impairments:     Visit Diagnosis: Chronic bilateral low back pain without sciatica  Muscle weakness (generalized)  Abnormal posture  Other abnormalities of gait and mobility     Problem List Patient Active Problem List   Diagnosis Date Noted  . Spinal stenosis of lumbar region with neurogenic claudication 04/15/2020  . Chronic bilateral low back pain with sciatica 04/15/2020  . Chronic combined systolic and diastolic CHF (congestive heart failure) ([place]) 03/27/2020  . Gait abnormality 10/14/2019  . Mixed hyperlipidemia 07/31/2019  . Vitamin D deficiency disease 05/01/2019  . Chronic renal disease, stage 3, moderately decreased glomerular filtration rate (GFR) between 30-59 mL/min/1.73 square meter 05/01/2019  . History of adenomatous polyp of colon 07/17/2018  . Bladder prolapse, female, acquired 09/21/2017  . IFG (impaired fasting glucose) 10/21/2016  . Gastro-esophageal reflux disease without esophagitis 10/20/2016  . Idiopathic peripheral neuropathy 04/08/2016  . Osteoarthritis of spine with radiculopathy, lumbar region 11/24/2015  . Osteopenia 05/28/2014  .  Essential hypertension 03/05/2014  . Irritable bowel syndrome (IBS) 03/27/2013  . Former smoker 03/06/2013  . Seborrheic dermatitis of scalp 03/06/2013  . Allergic rhinitis 01/03/2012  . Symptomatic PVCs 01/03/2012  PHYSICAL THERAPY DISCHARGE SUMMARY  Visits from Start of Care: 31  Current functional level related to goals / functional outcomes: See above for most current goal status.     Remaining deficits: See above.    Education / Equipment: HEP/body Investment banker, corporate: Patient agrees to discharge.  Patient goals were partially met. Patient is being discharged due to being pleased with the current functional level.  ?????       Myrene Galas, PTA 04/22/20 4:08 PM  04/22/2020,  [facility] [address], [place], [place], [address] Phone: [phone]   Fax:  [phone]  Name: JULIANN OLESKY MRN: [id number] Date of Birth: 03/14/1946

## 2020-05-21 ENCOUNTER — Encounter: Payer: Self-pay | Admitting: Physical Medicine and Rehabilitation

## 2020-05-24 NOTE — Progress Notes (Signed)
ADVANCED [facility] CLINIC CONSULT NOTE  Referring Physician: Nahser Primary Care: Dr. Billey Chang Primary Cardiologist: Nahser  HPI:  74 y/o woman with GERD, IBS and PVCs referred by Dr. 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   She has been followed by Dr. Acie Fredrickson and Dr. Paulette Blanch PVCs and dyspnea. Has been on Norpace. Recently had increasing weakness and orthostasis and concern raised for TTR amyloid.cMRI ordered and showed EF 42% with LVNC. (no amyloid).  4./19 EF 50% Grade I DD 4/19 Lexiscan Myoview EF 48% question old apical infarct 2/20 EF 45-50% Grade I DD 3/21 EF 30-35% 3/21 PYP equivocal 4/21 cMRI EF 42% no LGE. + LVNC  Cath 4/21  EF 45-50% No CAD RA = 4 PA = 38/11 (23) PCWP = 9 Fick 6.6/3.5  She is here with her husband. In Jan 2020 had L knee meniscus surgery c/b tibial fracture sow as in bed for some time. Activity level has not bounced back since that time. Now can walk some with a walking. Can walk half a mile at a slow pace. Weight stayed the same. BP very labile. Occasionally goes down into 90s.     Review of Systems: [y] = yes, [ ]  = no   General: Weight gain [ ] ; Weight loss [ ] ; Anorexia [ ] ; Fatigue Blue.Reese ]; Fever [ ] ; Chills [ ] ; Weakness [ ]   Cardiac: Chest pain/pressure [ ] ; Resting SOB [ ] ; Exertional SOB Blue.Reese ]; Orthopnea [ ] ; Pedal Edema Blue.Reese ]; Palpitations [ ] ; Syncope [ ] ; Presyncope [ ] ; Paroxysmal nocturnal dyspnea[ ]   Pulmonary: Cough [ ] ; Wheezing[ ] ; Hemoptysis[ ] ; Sputum [ ] ; Snoring [ ]   GI: Vomiting[ ] ; Dysphagia[ ] ; Melena[ ] ; Hematochezia [ ] ; Heartburn[ ] ; Abdominal pain [ ] ; Constipation [ ] ; Diarrhea [ ] ; BRBPR [ ]   GU: Hematuria[ ] ; Dysuria [ ] ; Nocturia[ ]   Vascular: Pain in legs with walking Blue.Reese ]; Pain in feet with lying flat [ ] ; Non-healing sores [ ] ; Stroke [ ] ; TIA [ ] ; Slurred speech [ ] ;  Neuro: Headaches[ ] ; Vertigo[ ] ; Seizures[ ] ; Paresthesias[ ] ;Blurred vision [ ] ; Diplopia [ ] ; Vision changes [ ]     Ortho/Skin: Arthritis Blue.Reese ]; Joint pain Blue.Reese ]; Muscle pain [ ] ; Joint swelling [ ] ; Back Pain Blue.Reese ]; Rash [ ]   Psych: Depression[ ] ; Anxiety[ ]   Heme: Bleeding problems [ ] ; Clotting disorders [ ] ; Anemia [ ]   Endocrine: Diabetes [ ] ; Thyroid dysfunction[ ]    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     azelastine (ASTELIN) 0.1 % nasal spray Place 2 sprays into both nostrils 2 (two) times daily. 30 mL 12   Biotin 1000 MCG tablet Take 2,000  mcg by mouth daily.      calcium carbonate (TUMS CALCIUM FOR LIFE BONE) 750 MG chewable tablet Chew 750 mg by mouth every evening.      Cholecalciferol (VITAMIN D3) 2000 units TABS Take 2,000 Units by mouth every evening.      Ciclopirox 1 % shampoo Apply 1 application topically every other day.      disopyramide (NORPACE CR) 150 MG 12 hr capsule Take 1 capsule (150 mg total) by mouth daily. 90 capsule 3   docusate sodium (COLACE) 100 MG capsule Take 100 mg by mouth every evening.      famotidine (PEPCID) 20 MG tablet Take 20 mg by mouth daily as needed for heartburn or indigestion.     famotidine (PEPCID) 40 MG tablet Take 1 tablet (40 mg total) by mouth daily as needed for heartburn or indigestion. 90 tablet 1   [START ON 06/15/2020]  HYDROcodone-acetaminophen (NORCO) 5-325 MG tablet Take 1 tablet by mouth 2 (two) times daily as needed for moderate pain. 60 tablet 0   Hyoscyamine Sulfate SL (LEVSIN/SL) 0.125 MG SUBL Place 1 tablet under the tongue every 6 (six) hours as needed. 120 tablet 0   losartan (COZAAR) 25 MG tablet Take 1 tablet (25 mg total) by mouth daily. 90 tablet 1   mometasone (ELOCON) 0.1 % lotion Apply 1 application topically as needed for irritation.     Multiple Vitamin (MULTIVITAMIN) tablet Take 1 tablet by mouth daily.     sodium chloride (MURO 128) 2 % ophthalmic solution Place 1 drop into both eyes at bedtime.      tretinoin (RETIN-A) 0.025 % cream Apply 1 application topically daily as needed (skin bumps).   0   No current facility-administered medications for this encounter.    Allergies  Allergen Reactions   Phenergan [Promethazine Hcl] Shortness Of Breath and Other (See Comments)    Pt states she got to where she could not breath when she got iv phenergan after surgery   Erythromycin Diarrhea and Nausea And Vomiting   Azithromycin Other (See Comments)    Including macrobid  (weakness)   Epinephrine Other (See Comments)    Heart arrythmias   Nitrofurantoin Nausea Only and Other (See Comments)    Weakness      Social History   Socioeconomic History   Marital status: Married    Spouse name: Not on file   Number of children: 2   Years of education: 15   Highest education level: Not on file  Occupational History   Occupation: Retired Therapist, sports  Tobacco Use   Smoking status: Former Smoker    Quit date: 02/02/2013    Years since quitting: 7.3   Smokeless tobacco: Never Used  Vaping Use   Vaping Use: Never used  Substance and Sexual Activity   Alcohol use: Yes    Alcohol/week: 3.0 - 5.0 standard drinks    Types: 3 - 5 Glasses of wine per week   Drug use: No   Sexual activity: Yes  Other Topics Concern   Not on file  Social History Narrative   Lives in [place]  with spouse at [facility]    Previously Volunteered as a Contractor   Caffeine use: none, quit at least 30 years ago   Social Determinants of Radio broadcast assistant Strain:    Difficulty of Paying Living Expenses:   Food Insecurity:    Worried About Charity fundraiser in the Last Year:  Ran Out of Food in the Last Year:   Transportation Needs:    Film/video editor (Medical):    Lack of Transportation (Non-Medical):   Physical Activity:    Days of Exercise per Week:    Minutes of Exercise per Session:   Stress:    Feeling of Stress :   Social Connections:    Frequency of Communication with Friends and Family:    Frequency of Social Gatherings with Friends and Family:    Attends Religious Services:    Active Member of Clubs or Organizations:    Attends Music therapist:    Marital Status:   Intimate Partner Violence:    Fear of Current or Ex-Partner:    Emotionally Abused:    Physically Abused:    Sexually Abused:       Family History  Problem Relation Age of Onset   Hypertension Mother    High Cholesterol Mother    Cancer Father        head and neck   Hypertension Sister    Hypertension Sister    Diabetes type II Sister        was on steroids    Hypertension Sister    Hypertension Sister    Cancer Sister        bladder   Early death Neg Hx    Diabetes Neg Hx    Heart disease Neg Hx    Kidney disease Neg Hx    Stroke Neg Hx    Breast cancer Neg Hx    Parkinson's disease Neg Hx     Vitals:   05/25/20 1145  BP: 116/82  Pulse: 90  SpO2: 99%  Weight: 85 kg (187 lb 6.4 oz)    PHYSICAL EXAM: General:  Obese woman. No respiratory difficulty HEENT: normal Neck: supple. no JVD. Carotids 2+ bilat; no bruits. No lymphadenopathy or thryomegaly appreciated. Cor: PMI nondisplaced. Regular rate & rhythm. No rubs, gallops or murmurs. Lungs: clear Abdomen: obese soft,  nontendod bowel sounds. Extremities: no cyanosis, clubbing, rash, tr edema Neuro: alert & oriented xer, nondistended. No hepatosplenomegaly. No bruits or masses. Go 3, cranial nerves grossly intact. moves all 4 extremities w/o difficulty. Affect pleasant.  ECG: NSR 90. IVCD No PVCs Personally reviewed   ASSESSMENT & PLAN:  1. Mild LV systolic dysfunction/HF due to LVNC - NYHA III but doubt exercise intolerance due mostly to weight and orthopedic issues at this time - Volume status seems OK - cMRI 4/21 EF 42% No LGE. + LVNC - cath 4/21 normal cors and normal hemodynamics - EF has been stable over many years so suspect she may have an indolent phenotype - GDMT has been limited by low/labile BP - Continue losartan - Add Toprol XL 25 qhs  - Consider spiro 12.5 at next visit - Refer for genetic testing - Place Zio patch to make sure PVCs well suppressed and not contributing to LV dysfunction  - Discussed need for more activity and weight loss as able - Follow with yearly echos  2. Frequent PVCs - has been maintained on Norpace - followed by Dr. Rayann Heman - Place Zio patch as above  Glori Bickers, MD  10:03 PM

## 2020-05-25 ENCOUNTER — Ambulatory Visit (HOSPITAL_COMMUNITY)
Admission: RE | Admit: 2020-05-25 | Discharge: 2020-05-25 | Disposition: A | Discharge status: Home / Self Care | Payer: Medicare Other | Source: Ambulatory Visit | Attending: Internal Medicine | Admitting: Internal Medicine

## 2020-05-25 ENCOUNTER — Encounter (HOSPITAL_COMMUNITY): Payer: Self-pay | Admitting: Internal Medicine

## 2020-05-25 ENCOUNTER — Other Ambulatory Visit: Payer: Self-pay

## 2020-05-25 VITALS — BP 116/82 | HR 90 | Wt 187.4 lb

## 2020-05-25 DIAGNOSIS — I509 Heart failure, unspecified: Secondary | ICD-10-CM | POA: Diagnosis not present

## 2020-05-25 DIAGNOSIS — Z833 Family history of diabetes mellitus: Secondary | ICD-10-CM | POA: Insufficient documentation

## 2020-05-25 DIAGNOSIS — Z8249 Family history of ischemic heart disease and other diseases of the circulatory system: Secondary | ICD-10-CM | POA: Diagnosis not present

## 2020-05-25 DIAGNOSIS — I5042 Chronic combined systolic (congestive) and diastolic (congestive) heart failure: Secondary | ICD-10-CM | POA: Diagnosis not present

## 2020-05-25 DIAGNOSIS — K219 Gastro-esophageal reflux disease without esophagitis: Secondary | ICD-10-CM | POA: Insufficient documentation

## 2020-05-25 DIAGNOSIS — Z86010 Personal history of colonic polyps: Secondary | ICD-10-CM | POA: Insufficient documentation

## 2020-05-25 DIAGNOSIS — Z8349 Family history of other endocrine, nutritional and metabolic diseases: Secondary | ICD-10-CM | POA: Diagnosis not present

## 2020-05-25 DIAGNOSIS — Z87440 Personal history of urinary (tract) infections: Secondary | ICD-10-CM | POA: Diagnosis not present

## 2020-05-25 DIAGNOSIS — Z881 Allergy status to other antibiotic agents status: Secondary | ICD-10-CM | POA: Diagnosis not present

## 2020-05-25 DIAGNOSIS — Z87891 Personal history of nicotine dependence: Secondary | ICD-10-CM | POA: Insufficient documentation

## 2020-05-25 DIAGNOSIS — R0989 Other specified symptoms and signs involving the circulatory and respiratory systems: Secondary | ICD-10-CM | POA: Diagnosis not present

## 2020-05-25 DIAGNOSIS — I493 Ventricular premature depolarization: Secondary | ICD-10-CM

## 2020-05-25 DIAGNOSIS — Z8052 Family history of malignant neoplasm of bladder: Secondary | ICD-10-CM | POA: Insufficient documentation

## 2020-05-25 DIAGNOSIS — Z888 Allergy status to other drugs, medicaments and biological substances status: Secondary | ICD-10-CM | POA: Diagnosis not present

## 2020-05-25 DIAGNOSIS — K589 Irritable bowel syndrome without diarrhea: Secondary | ICD-10-CM | POA: Insufficient documentation

## 2020-05-25 DIAGNOSIS — Z79899 Other long term (current) drug therapy: Secondary | ICD-10-CM | POA: Diagnosis not present

## 2020-05-25 DIAGNOSIS — I428 Other cardiomyopathies: Secondary | ICD-10-CM | POA: Insufficient documentation

## 2020-05-25 MED ORDER — METOPROLOL SUCCINATE ER 25 MG PO TB24: 25.0000 mg | ORAL_TABLET | Freq: Every day | ORAL | 6 refills | Status: DC

## 2020-05-25 NOTE — Progress Notes (Signed)
Zio patch placed onto patient.  All instructions and information reviewed with patient, they verbalize understanding with no questions. 

## 2020-05-25 NOTE — Patient Instructions (Signed)
Start Metoprolol XL 25 mg Daily at bedtime  Your provider has recommended that  you wear a Zio Patch for 14 days.  This monitor will record your heart rhythm for our review.  IF you have any symptoms while wearing the monitor please press the button.  If you have any issues with the patch or you notice a red or orange light on it please call the company at [phone].  Once you remove the patch please mail it back to the company as soon as possible so we can get the results.  You have been referred to Dr Broadus John at [facility] for genetic counseling, her office will call you for an appointment  Please call our office in December to schedule your follow up appointment  If you have any questions or concerns before your next appointment please send [place] a message through [place] or call our office at [phone].    TO LEAVE A MESSAGE FOR THE NURSE SELECT OPTION 2, PLEASE LEAVE A MESSAGE INCLUDING: . YOUR NAME . DATE OF BIRTH . CALL BACK NUMBER . REASON FOR CALL**this is important as we prioritize the call backs  YOU WILL RECEIVE A CALL BACK THE SAME DAY AS LONG AS YOU CALL BEFORE 4:00 PM

## 2020-06-15 ENCOUNTER — Other Ambulatory Visit: Payer: Self-pay

## 2020-06-15 ENCOUNTER — Ambulatory Visit (INDEPENDENT_AMBULATORY_CARE_PROVIDER_SITE_OTHER): Payer: Medicare Other | Admitting: Family Medicine

## 2020-06-15 ENCOUNTER — Ambulatory Visit (INDEPENDENT_AMBULATORY_CARE_PROVIDER_SITE_OTHER)
Admission: RE | Admit: 2020-06-15 | Discharge: 2020-06-15 | Disposition: A | Discharge status: Home / Self Care | Payer: Medicare Other | Source: Ambulatory Visit | Attending: Family Medicine | Admitting: Family Medicine

## 2020-06-15 VITALS — BP 130/78 | HR 75 | Temp 97.4°F | Ht 64.0 in

## 2020-06-15 DIAGNOSIS — K580 Irritable bowel syndrome with diarrhea: Secondary | ICD-10-CM | POA: Diagnosis not present

## 2020-06-15 DIAGNOSIS — S99922A Unspecified injury of left foot, initial encounter: Secondary | ICD-10-CM | POA: Diagnosis not present

## 2020-06-15 DIAGNOSIS — M79672 Pain in left foot: Secondary | ICD-10-CM | POA: Diagnosis not present

## 2020-06-15 DIAGNOSIS — R197 Diarrhea, unspecified: Secondary | ICD-10-CM

## 2020-06-15 DIAGNOSIS — M7732 Calcaneal spur, left foot: Secondary | ICD-10-CM | POA: Diagnosis not present

## 2020-06-15 MED ORDER — HYOSCYAMINE SULFATE SL 0.125 MG SL SUBL: 1.0000 | SUBLINGUAL_TABLET | Freq: Four times a day (QID) | SUBLINGUAL | 0 refills | Status: DC | PRN

## 2020-06-15 NOTE — Progress Notes (Signed)
   Carrie [name] is a 74 y.o. female who presents today for an office visit.  Assessment/Plan:  New/Acute Problems: Left foot pain Likely contusion versus mild sprain reassuring that she has been able to ambulate for the last few weeks however she does have some tenderness at the base of her left fifth metatarsal.  Given her known injury will check plain film to rule out fracture.  Chronic Problems Addressed Today: IBS Prior diarrhea likely represents mild flare of her IBS.  No red flags.  No other signs of infection today.  Recommended that she continue taking Imodium 4 times daily.  Also recommend that she restart hyoscyamine.  Encourage good oral hydration.    Subjective:  HPI:  Patient here with 3 to 4 days of diarrhea.  Symptoms started prior to going to the beach last week.  Despite this she had a couple of new medications added on by her cardiologist.  Symptoms have improved over the last day or so after she started taking Imodium.  She had left lower quadrant achiness.  No nausea or vomiting.  No prior constipation.  Some mild decreased appetite.  No blood in her stool.  She has also had a few weeks of left foot pain.  She injured it while getting into her vehicle a few weeks ago.  Pain mostly located on the outside edge.  She has tried using ice with modest improvement.  No bruising.  No swelling.  She has been able to walk without significant difficulty.       Objective:  Physical Exam: BP 130/78   Pulse 75   Temp (!) 97.4 F (36.3 C) (Temporal)   Ht 5\' 4"  (1.626 m)   SpO2 98%   BMI 32.17 kg/m   Gen: No acute distress, resting comfortably CV: Regular rate and rhythm with no murmurs appreciated Pulm: Normal work of breathing, clear to auscultation bilaterally with no crackles, wheezes, or rhonchi GI: Soft, nontender, nondistended.  Bowel sounds present MSK:-Left foot: No deformities.  Tender to palpation along fifth metatarsal base. Neuro: Grossly normal, moves all  extremities Psych: Normal affect and thought content      Clarise Chacko M. Jerline Pain, MD 06/15/2020 11:52 AM

## 2020-06-15 NOTE — Patient Instructions (Signed)
It was very nice to see you today!  Please go to the lab to have an x-ray done on your left foot.  Please make sure you are taking Imodium 4 times daily.  You can also use her hyoscyamine 4 times daily as well.  Please let me know or let Dr. Jonni Sanger know if your symptoms are not improving of the next week or so.  Take care, Dr Jerline Pain  Please try these tips to maintain a healthy lifestyle:   Eat at least 3 REAL meals and 1-2 snacks per day.  Aim for no more than 5 hours between eating.  If you eat breakfast, please do so within one hour of getting up.    Each meal should contain half fruits/vegetables, one quarter protein, and one quarter carbs (no bigger than a computer mouse)   Cut down on sweet beverages. This includes juice, soda, and sweet tea.     Drink at least 1 glass of water with each meal and aim for at least 8 glasses per day   Exercise at least 150 minutes every week.

## 2020-06-16 NOTE — Progress Notes (Signed)
Please inform patient of the following:  Xray shows no fracture but she does have some arthritis in her foot. Do not need to make any changes to her treatment plan but would like for to let me or Dr Jonni Sanger know if symptoms not improving.  Algis Greenhouse. Jerline Pain, MD 06/16/2020 12:48 PM

## 2020-06-24 ENCOUNTER — Ambulatory Visit: Payer: Medicare Other | Admitting: Neurology

## 2020-06-24 DIAGNOSIS — I493 Ventricular premature depolarization: Secondary | ICD-10-CM | POA: Diagnosis not present

## 2020-06-25 ENCOUNTER — Telehealth: Payer: Self-pay

## 2020-06-25 NOTE — Telephone Encounter (Signed)
Recommend she come back in for labs.  Algis Greenhouse. Jerline Pain, MD 06/25/2020 4:16 PM

## 2020-06-25 NOTE — Telephone Encounter (Signed)
Please advise 

## 2020-06-25 NOTE — Telephone Encounter (Signed)
Pt called stating she is still feeling unwell: having diarrhea, can not eat, lost 8 Lbs. Symptoms lessened for 3 days after taking the medicine prescribed by Dr. Jerline Pain, then returned.

## 2020-06-26 ENCOUNTER — Other Ambulatory Visit: Payer: Medicare Other

## 2020-06-26 ENCOUNTER — Other Ambulatory Visit: Payer: Self-pay

## 2020-06-26 ENCOUNTER — Other Ambulatory Visit: Payer: Self-pay | Admitting: *Deleted

## 2020-06-26 DIAGNOSIS — R634 Abnormal weight loss: Secondary | ICD-10-CM

## 2020-06-26 DIAGNOSIS — R197 Diarrhea, unspecified: Secondary | ICD-10-CM | POA: Diagnosis not present

## 2020-06-26 NOTE — Telephone Encounter (Signed)
Please place order for CBC, CMET, TSH, and lipase.   Algis Greenhouse. Jerline Pain, MD 06/26/2020 10:58 AM

## 2020-06-26 NOTE — Telephone Encounter (Signed)
Orders placed.

## 2020-06-26 NOTE — Telephone Encounter (Signed)
Patient scheduled for lab appt today, no labs in system. Please advise

## 2020-06-26 NOTE — Telephone Encounter (Signed)
Scheduled pt for labs

## 2020-06-27 LAB — COMPREHENSIVE METABOLIC PANEL
AG Ratio: 1.7 (calc) (ref 1.0–2.5)
ALT: 19 U/L (ref 6–29)
AST: 17 U/L (ref 10–35)
Albumin: 4.3 g/dL (ref 3.6–5.1)
Alkaline phosphatase (APISO): 62 U/L (ref 37–153)
BUN/Creatinine Ratio: 15 (calc) (ref 6–22)
BUN: 23 mg/dL (ref 7–25)
CO2: 28 mmol/L (ref 20–32)
Calcium: 9.6 mg/dL (ref 8.6–10.4)
Chloride: 105 mmol/L (ref 98–110)
Creat: 1.52 mg/dL — ABNORMAL HIGH (ref 0.60–0.93)
Globulin: 2.6 g/dL (calc) (ref 1.9–3.7)
Glucose, Bld: 129 mg/dL — ABNORMAL HIGH (ref 65–99)
Potassium: 4.6 mmol/L (ref 3.5–5.3)
Sodium: 139 mmol/L (ref 135–146)
Total Bilirubin: 0.5 mg/dL (ref 0.2–1.2)
Total Protein: 6.9 g/dL (ref 6.1–8.1)

## 2020-06-27 LAB — CBC WITH DIFFERENTIAL/PLATELET
Absolute Monocytes: 511 cells/uL (ref 200–950)
Basophils Absolute: 37 cells/uL (ref 0–200)
Basophils Relative: 0.5 %
Eosinophils Absolute: 183 cells/uL (ref 15–500)
Eosinophils Relative: 2.5 %
HCT: 38.7 % (ref 35.0–45.0)
Hemoglobin: 12.7 g/dL (ref 11.7–15.5)
Lymphs Abs: 1978 cells/uL (ref 850–3900)
MCH: 29.5 pg (ref 27.0–33.0)
MCHC: 32.8 g/dL (ref 32.0–36.0)
MCV: 89.8 fL (ref 80.0–100.0)
MPV: 10.7 fL (ref 7.5–12.5)
Monocytes Relative: 7 %
Neutro Abs: 4592 cells/uL (ref 1500–7800)
Neutrophils Relative %: 62.9 %
Platelets: 268 10*3/uL (ref 140–400)
RBC: 4.31 10*6/uL (ref 3.80–5.10)
RDW: 13 % (ref 11.0–15.0)
Total Lymphocyte: 27.1 %
WBC: 7.3 10*3/uL (ref 3.8–10.8)

## 2020-06-27 LAB — TSH: TSH: 1.6 mIU/L (ref 0.40–4.50)

## 2020-06-27 LAB — LIPASE: Lipase: 13 U/L (ref 7–60)

## 2020-06-29 ENCOUNTER — Other Ambulatory Visit: Payer: Self-pay

## 2020-06-29 DIAGNOSIS — E860 Dehydration: Secondary | ICD-10-CM

## 2020-06-29 NOTE — Progress Notes (Signed)
Please inform patient of the following:  Blood work shows that that she is dehydrated. Would like for her to make sure she is getting plenty of fluids and we should recheck in a week or so. Please place future order for BMET.  Algis Greenhouse. Jerline Pain, MD 06/29/2020 8:53 AM

## 2020-07-01 ENCOUNTER — Encounter: Payer: Self-pay | Admitting: Physical Medicine and Rehabilitation

## 2020-07-01 ENCOUNTER — Encounter
Payer: Medicare Other | Attending: Physical Medicine and Rehabilitation | Admitting: Physical Medicine and Rehabilitation

## 2020-07-01 ENCOUNTER — Other Ambulatory Visit: Payer: Self-pay

## 2020-07-01 VITALS — BP 116/76 | HR 78 | Temp 98.9°F | Ht 63.5 in | Wt 181.0 lb

## 2020-07-01 DIAGNOSIS — M5440 Lumbago with sciatica, unspecified side: Secondary | ICD-10-CM | POA: Diagnosis not present

## 2020-07-01 DIAGNOSIS — R269 Unspecified abnormalities of gait and mobility: Secondary | ICD-10-CM | POA: Insufficient documentation

## 2020-07-01 DIAGNOSIS — G8929 Other chronic pain: Secondary | ICD-10-CM | POA: Diagnosis not present

## 2020-07-01 DIAGNOSIS — M48062 Spinal stenosis, lumbar region with neurogenic claudication: Secondary | ICD-10-CM | POA: Diagnosis not present

## 2020-07-01 NOTE — Patient Instructions (Signed)
  Patient is a 74 yr old R handed female with hx of  Lumbar spinal stenosis - last fusion 2012- 2 levels- L4-5 and previous discectomies x2 2011/2012. Also hx of CHF- L ventricular spongy- congenital; and possible Parkinson's disease- gait abnormality- here for evaluation.  Knee surgery - L meniscal repair-January 2020- got inflamed- had fx tibial plate-  Had to stay down for a long time.   1. 5 days/week home exercise program - at least 10-15 minutes/day-is the magical number.   2. UDS/ opiate contract-  Had 69 Norco from last Rx- mid May- and got refill 7/23 for additional 60 tabs, so doesn't need any right now.   3. Doesn't want to try nerve pain meds- however would suggest Duloxetine in future. If has worsening back pain.   4. Suggest toe tapping- while watching TV/sitting around to build endurance.   5. Small bike pedals- to work on building strength in legs B/L.- can build up from 20 minutes over time.   6. Can get from PCP - Norco- but if they want me to, can take over in future. Just let know.    7. F/U as needed

## 2020-07-01 NOTE — Progress Notes (Signed)
Subjective:    Patient ID: Carrie [name], female    DOB: Feb 12, 1946, 74 y.o.   MRN: [id number]  HPI  Patient is a 74 yr old R handed female with hx of  Lumbar spinal stenosis - last fusion 2012- 2 levels- L4-5 and previous discectomies x2 2011/2012. Also hx of CHF- L ventricular spongy- congenital; and possible Parkinson's disease- gait abnormality- here for evaluation.  Knee surgery - L meniscal repair-January 2020- got inflamed- had fx tibial plate-  Had to stay down for a long time.      Hasn't taken any Norco in 1 week-  Prescribed by Dr Jonni Sanger- her PCP-    Going into vacation - a lot of stairs and climbing-  At most 1x/day.   Just 1 time Rx- from PCP, but normally doesn't take regularly.   If pain doesn't go away when sits down or lays down- sometimes goes away of stretches out  Certain triggers stand and chop; brush teeth,   Use 1-2 walking poles outside Trying to walk without poles in the house.  For last 3-4 days- gone without poles in home.   Because wants to be able to walk without them.   Used to walk a lot- did weights 2x/week; pilates 1x/week, before the knee surgery did all this exercise. Thinks aged 53 years when had knee problems.   Did 40 visits in January to May 2021- 1-2x/week Still stretching many mornings-  Does 3 of exercises before gets out of bed- helps early AM pain.   Pain described as- tight, sore, squeezing pain.  Doesn't usually go down legs;  hard to stand up when it occurs. So no radiculopathy.    Tried: Tried some tramadol when had knee surgery- didn't do anything- awhile ago- probably took 1 tab at a time-  Gabapentin- doesn't remember why didn't work/had some side effects.  Not supposed to take NSAIDs Has TENs unit- but PT didn't go over use of TENS unit with her.  Going to massage therapy- just starting soon.  Epidural were done- Dr Sherwood Gambler- 2x late 2020- no improvement at all.   Doesn't want something regularly- daily- wants Norco-  since can take as needed.    Social Hx:  [facility]- independent living- gets meals from there.  Quit smoking 2014-  Has access to pool- can do 3x/week- started back recently.   Pain Inventory Average Pain 8 Pain Right Now 0 My pain is intermittent, sharp and stabbing  In the last 24 hours, has pain interfered with the following? General activity 8 Relation with others 8 Enjoyment of life 10 What TIME of day is your pain at its worst? daytime Sleep (in general) Good  Pain is worse with: bending Pain improves with: rest and medication Relief from Meds: 7  Mobility walk without assistance walk with assistance use a cane how many minutes can you walk? 5 ability to climb steps?  yes do you drive?  yes  Function retired  Neuro/Psych weakness tingling trouble walking  Prior Studies new  Physicians involved in your care new   Family History  Problem Relation Age of Onset  . Hypertension Mother   . High Cholesterol Mother   . Cancer Father        head and neck  . Hypertension Sister   . Hypertension Sister   . Diabetes type II Sister        was on steroids   . Hypertension Sister   . Hypertension Sister   .  Cancer Sister        bladder  . Early death Neg Hx   . Diabetes Neg Hx   . Heart disease Neg Hx   . Kidney disease Neg Hx   . Stroke Neg Hx   . Breast cancer Neg Hx   . Parkinson's disease Neg Hx    Social History   Socioeconomic History  . Marital status: Married    Spouse name: Not on file  . Number of children: 2  . Years of education: 48  . Highest education level: Not on file  Occupational History  . Occupation: Retired Therapist, sports  Tobacco Use  . Smoking status: Former Smoker    Quit date: 02/02/2013    Years since quitting: 7.4  . Smokeless tobacco: Never Used  Vaping Use  . Vaping Use: Never used  Substance and Sexual Activity  . Alcohol use: Yes    Alcohol/week: 3.0 - 5.0 standard drinks    Types: 3 - 5 Glasses of wine per week  .  Drug use: No  . Sexual activity: Yes  Other Topics Concern  . Not on file  Social History Narrative   Lives in [place] with spouse at [facility]    Previously Volunteered as a Contractor   Caffeine use: none, quit at least 30 years ago   Social Determinants of Radio broadcast assistant Strain:   . Difficulty of Paying Living Expenses:   Food Insecurity:   . Worried About Charity fundraiser in the Last Year:   . Arboriculturist in the Last Year:   Transportation Needs:   . Film/video editor (Medical):   Marland Kitchen Lack of Transportation (Non-Medical):   Physical Activity:   . Days of Exercise per Week:   . Minutes of Exercise per Session:   Stress:   . Feeling of Stress :   Social Connections:   . Frequency of Communication with Friends and Family:   . Frequency of Social Gatherings with Friends and Family:   . Attends Religious Services:   . Active Member of Clubs or Organizations:   . Attends Archivist Meetings:   Marland Kitchen Marital Status:    Past Surgical History:  Procedure Laterality Date  . CHOLECYSTECTOMY N/A 08/23/2016   Procedure: LAPAROSCOPIC CHOLECYSTECTOMY;  Surgeon: Rolm Bookbinder, MD;  Location: [facility];  Service: General;  Laterality: N/A;  . COLECTOMY Right 1998   large colonic polyp  . ENDOSCOPIC VEIN LASER TREATMENT Bilateral 2010-2011  . KNEE ARTHROSCOPY W/ MENISCECTOMY Left   . [name]  2001  . RIGHT/LEFT HEART CATH AND CORONARY ANGIOGRAPHY N/A 03/18/2020   Procedure: RIGHT/LEFT HEART CATH AND CORONARY ANGIOGRAPHY;  Surgeon: Jettie Booze, MD;  Location: [facility];  Service: Cardiovascular;  Laterality: N/A;  . SPINE SURGERY     3 since 11/2010, laminectomy, May 2012 lumbar fusion, diskectomy   Past Medical History:  Diagnosis Date  . BPPV (benign paroxysmal positional vertigo), bilateral 04/11/2019  . CHF (congestive heart failure) ([place])   . Colon polyps   . Esophageal spasm   . Fatigue    . Frequent PVCs   . GERD (gastroesophageal reflux disease)   . IBS (irritable bowel syndrome)   . Lumbar stenosis    "around L4,5, somewhere in there"  . LV non-compaction cardiomyopathy ([place])    pt states she was told this is congenital  . Neurogenic claudication   . Palpitations   . Recurrent UTI   .  Seborrheic dermatitis of scalp 03/06/13  . Vitamin B 12 deficiency    BP 116/76   Pulse 78   Temp 98.9 F (37.2 C)   Ht 5' 3.5" (1.613 m)   Wt 181 lb (82.1 kg)   SpO2 98%   BMI 31.56 kg/m   Opioid Risk Score:   Fall Risk Score:  `1  Depression screen PHQ 2/9  Depression screen [facility] 2/9 07/01/2020 11/22/2019 07/31/2019 09/21/2017  Decreased Interest 1 0 0 0  Down, Depressed, Hopeless 0 0 0 0  PHQ - 2 Score 1 0 0 0  Altered sleeping 0 - - -  Tired, decreased energy 1 - - -  Change in appetite 0 - - -  Feeling bad or failure about yourself  1 - - -  Trouble concentrating 0 - - -  Moving slowly or fidgety/restless 0 - - -  Suicidal thoughts 0 - - -  PHQ-9 Score 3 - - -  Difficult doing work/chores Somewhat difficult - - -    Review of Systems  Constitutional: Negative.   HENT: Negative.   Eyes: Negative.   Respiratory: Negative.   Cardiovascular: Negative.   Gastrointestinal: Negative.   Endocrine: Negative.   Genitourinary: Negative.   Musculoskeletal: Positive for gait problem.  Skin: Negative.   Allergic/Immunologic: Negative.   Neurological:       Tingling   Hematological: Negative.   Psychiatric/Behavioral: Negative.   All other systems reviewed and are negative.      Objective:   Physical Exam Awake, alert, appropriate, flat affect, has walking pole in room, NAD MS: RLE_ HF 5/5, KE and KF 5/5, DF and PF 5/5 LLE- HF 5-/5, KE 5-/5, KF 4/5, DF 4 to 4+/5, PF 5-/5 Neuro: intact to light touch in LEs B/L- but did mention L foot sometimes gets tingly  Gait- dragging L foot very slightly- floot slap evident near end of gait- cannot stand easily on heels  B/L- can do on R; not really on L- can stand on toes however.  TTP over L paraspinals- and midline- mid lumbar-    Had non Epic based MRI- said had lumbar stenosis.  Was done fall 2020      Assessment & Plan:   Patient is a 74 yr old R handed female with hx of  Lumbar spinal stenosis - last fusion 2012- 2 levels- L4-5 and previous discectomies x2 2011/2012. Also hx of CHF- L ventricular spongy- congenital; and possible Parkinson's disease- gait abnormality- here for evaluation.  Knee surgery - L meniscal repair-January 2020- got inflamed- had fx tibial plate-  Had to stay down for a long time.   1. 5 days/week home exercise program - at least 10-15 minutes/day-is the magical number.   2. UDS/ opiate contract- if gets meds from [place].  Had 44 Norco from last Rx- mid May- and got refill 7/23 for additional 60 tabs, so doesn't need any right now.   3. Doesn't want to try nerve pain meds- however would suggest Duloxetine in future. If has worsening back pain.   4. Suggest toe tapping- while watching TV/sitting around to build endurance.   5. Small bike pedals- to work on building strength in legs B/L.- can build up from 20 minutes over time.   6. Can get from PCP - Norco- but if they want me to, can take over in future. Just let know.    7. F/U as needed  I spent a total of 50 minutes on appointment- as detailed  above.

## 2020-07-02 NOTE — Addendum Note (Signed)
Encounter addended by: Micki Riley, RN on: 07/02/2020 2:11 PM  Actions taken: Imaging Exam ended

## 2020-07-06 ENCOUNTER — Other Ambulatory Visit: Payer: Medicare Other

## 2020-07-06 ENCOUNTER — Telehealth (HOSPITAL_COMMUNITY): Payer: Self-pay | Admitting: *Deleted

## 2020-07-06 ENCOUNTER — Other Ambulatory Visit: Payer: Self-pay

## 2020-07-06 DIAGNOSIS — E860 Dehydration: Secondary | ICD-10-CM

## 2020-07-06 LAB — BASIC METABOLIC PANEL WITH GFR
BUN/Creatinine Ratio: 17 (calc) (ref 6–22)
BUN: 21 mg/dL (ref 7–25)
CO2: 30 mmol/L (ref 20–32)
Calcium: 9.5 mg/dL (ref 8.6–10.4)
Chloride: 102 mmol/L (ref 98–110)
Creat: 1.26 mg/dL — ABNORMAL HIGH (ref 0.60–0.93)
Glucose, Bld: 104 mg/dL — ABNORMAL HIGH (ref 65–99)
Potassium: 4.5 mmol/L (ref 3.5–5.3)
Sodium: 139 mmol/L (ref 135–146)

## 2020-07-06 NOTE — Telephone Encounter (Signed)
Pt called stating she stopped metoprolol over the weekend because her bp was too low and she would "almost faint". BP running 60/50s for about 4 days. Pts bp this morning without medication was 102/68. Pt wants to remain off metoprolol and continue to monitor bp. Pt said her swelling is better and that she thinks her diuretic and cozar are enough.  Routed to Amy Clegg,NP

## 2020-07-06 NOTE — Addendum Note (Signed)
Addended by: Harvie Junior on: 07/06/2020 03:30 PM   Modules accepted: Orders

## 2020-07-06 NOTE — Telephone Encounter (Signed)
  Ok to stay off metoprolol for now but I am concerned she may have more PVCs/palpitations.   Should this reoccur we will need to get her back metoprolol.   Please call.   Jaqwan Wieber NP-C  3:23 PM

## 2020-07-06 NOTE — Telephone Encounter (Signed)
Pt aware and verbalized understanding.  

## 2020-07-07 NOTE — Progress Notes (Signed)
Please inform patient of the following:  Kidney numbers looking better. Would like for her to continue with staying well hydrated and let us know if her symptoms do not continue to improve.  Algis Greenhouse. Jerline Pain, MD 07/07/2020 8:22 AM

## 2020-07-09 ENCOUNTER — Other Ambulatory Visit: Payer: Self-pay | Admitting: Family Medicine

## 2020-07-10 ENCOUNTER — Other Ambulatory Visit: Payer: Self-pay

## 2020-07-10 ENCOUNTER — Telehealth (HOSPITAL_COMMUNITY): Payer: Self-pay | Admitting: *Deleted

## 2020-07-10 ENCOUNTER — Encounter: Payer: Self-pay | Admitting: Family Medicine

## 2020-07-10 ENCOUNTER — Ambulatory Visit (INDEPENDENT_AMBULATORY_CARE_PROVIDER_SITE_OTHER): Payer: Medicare Other | Admitting: Family Medicine

## 2020-07-10 VITALS — BP 132/80 | HR 96 | Temp 98.3°F | Resp 18 | Ht 63.5 in | Wt 183.6 lb

## 2020-07-10 DIAGNOSIS — M19072 Primary osteoarthritis, left ankle and foot: Secondary | ICD-10-CM | POA: Insufficient documentation

## 2020-07-10 NOTE — Progress Notes (Signed)
Subjective  CC:  Chief Complaint  Patient presents with  . Foot Pain    Left foot. It hurts to walk on it. Pain keeps her from walking( exercising)    HPI: Carrie [name] is a 74 y.o. female who presents to the office today to address the problems listed above in the chief complaint.  Patient was seen earlier in July for left foot pain.  I reviewed that note.  She reports that during her trip abroad, she had acute left foot pain while getting into vehicle.  She is not sure if she twisted her foot but since then she has had pain, at first the lateral aspect but now more consistently the medial midfoot.  It pains her with most steps.  At times she will have a dull ache in the area.  Is been no swelling, no ankle pain, no calf pain.  She does not limp.  She does have a gait abnormality and has been using her walking sticks.  No falls.  She has not taken anything for the pain.  A foot x-ray was reviewed and shows cuneiform navicular osteoarthritis medial aspect of the left foot.  She has calcaneal spurring.  No soft tissue abnormalities were noted.  Assessment  1. Primary osteoarthritis of left foot      Plan   Foot arthritis: Exam, x-ray and history are consistent with osteoarthritis as the cause of her pain.  Recommend extra strength Tylenol 1000 mg twice daily, Voltaren gel 4 times daily as needed, and good supportive shoes.  I would recommend podiatry consult for shoe inserts or orthotics if persists.  Patient understands and agrees with care plan  Follow up: As scheduled 10/19/2020  No orders of the defined types were placed in this encounter.  No orders of the defined types were placed in this encounter.     I reviewed the patients updated PMH, FH, and SocHx.    Patient Active Problem List   Diagnosis Date Noted  . Spinal stenosis of lumbar region with neurogenic claudication 04/15/2020    Priority: High  . Chronic bilateral low back pain with sciatica 04/15/2020     Priority: High  . Chronic combined systolic and diastolic CHF (congestive heart failure) ([place]) 03/27/2020    Priority: High  . Gait abnormality 10/14/2019    Priority: High  . Mixed hyperlipidemia 07/31/2019    Priority: High  . History of adenomatous polyp of colon 07/17/2018    Priority: High  . IFG (impaired fasting glucose) 10/21/2016    Priority: High  . Idiopathic peripheral neuropathy 04/08/2016    Priority: High  . Essential hypertension 03/05/2014    Priority: High  . Chronic renal disease, stage 3, moderately decreased glomerular filtration rate (GFR) between 30-59 mL/min/1.73 square meter 05/01/2019    Priority: Medium  . Gastro-esophageal reflux disease without esophagitis 10/20/2016    Priority: Medium  . Osteoarthritis of spine with radiculopathy, lumbar region 11/24/2015    Priority: Medium  . Osteopenia 05/28/2014    Priority: Medium  . Irritable bowel syndrome (IBS) 03/27/2013    Priority: Medium  . Former smoker 03/06/2013    Priority: Medium  . Symptomatic PVCs 01/03/2012    Priority: Medium  . Primary osteoarthritis of left foot 07/10/2020    Priority: Low  . Vitamin D deficiency disease 05/01/2019    Priority: Low  . Bladder prolapse, female, acquired 09/21/2017    Priority: Low  . Seborrheic dermatitis of scalp 03/06/2013    Priority:  Low  . Allergic rhinitis 01/03/2012    Priority: Low   Current Meds  Medication Sig  . azelastine (ASTELIN) 0.1 % nasal spray Place 2 sprays into both nostrils 2 (two) times daily.  . Biotin 1000 MCG tablet Take 2,000 mcg by mouth daily.   . calcium carbonate (TUMS CALCIUM FOR LIFE BONE) 750 MG chewable tablet Chew 750 mg by mouth every evening.   . Cholecalciferol (VITAMIN D3) 2000 units TABS Take 2,000 Units by mouth every evening.   . Ciclopirox 1 % shampoo Apply 1 application topically every other day.   . disopyramide (NORPACE CR) 150 MG 12 hr capsule Take 1 capsule (150 mg total) by mouth daily.  Marland Kitchen docusate  sodium (COLACE) 100 MG capsule Take 100 mg by mouth every evening.   . famotidine (PEPCID) 40 MG tablet Take 1 tablet (40 mg total) by mouth daily as needed for heartburn or indigestion.  Marland Kitchen HYDROcodone-acetaminophen (NORCO) 5-325 MG tablet Take 1 tablet by mouth 2 (two) times daily as needed for moderate pain.  Marland Kitchen Hyoscyamine Sulfate SL (LEVSIN/SL) 0.125 MG SUBL Place 1 tablet under the tongue every 6 (six) hours as needed.  Marland Kitchen losartan (COZAAR) 25 MG tablet Take 1 tablet (25 mg total) by mouth daily.  . mometasone (ELOCON) 0.1 % lotion Apply 1 application topically as needed for irritation.  . Multiple Vitamin (MULTIVITAMIN) tablet Take 1 tablet by mouth daily.  . sodium chloride (MURO 128) 2 % ophthalmic solution Place 1 drop into both eyes at bedtime.   . Wheat Dextrin (BENEFIBER PO) Take by mouth daily.     Allergies: Patient is allergic to phenergan [promethazine hcl], erythromycin, azithromycin, epinephrine, and nitrofurantoin. Family History: Patient family history includes Cancer in her father and sister; Diabetes type II in her sister; High Cholesterol in her mother; Hypertension in her mother, sister, sister, sister, and sister. Social History:  Patient  reports that she quit smoking about 7 years ago. She has never used smokeless tobacco. She reports current alcohol use of about 3.0 - 5.0 standard drinks of alcohol per week. She reports that she does not use drugs.  Review of Systems: Constitutional: Negative for fever malaise or anorexia Cardiovascular: negative for chest pain Respiratory: negative for SOB or persistent cough Gastrointestinal: negative for abdominal pain  Objective  Vitals: BP 132/80   Pulse 96   Temp 98.3 F (36.8 C) (Temporal)   Resp 18   Ht 5' 3.5" (1.613 m)   Wt 183 lb 9.6 oz (83.3 kg)   SpO2 98%   BMI 32.01 kg/m  General: no acute distress , A&Ox3 Left foot: Normal-appearing tender over medial forefoot, no lateral tenderness, negative foot squeeze  test.  Negative lateral ligament tenderness.  Normal ankle exam.  No heel tenderness     Commons side effects, risks, benefits, and alternatives for medications and treatment plan prescribed today were discussed, and the patient expressed understanding of the given instructions. Patient is instructed to call or message via MyChart if he/she has any questions or concerns regarding our treatment plan. No barriers to understanding were identified. We discussed Red Flag symptoms and signs in detail. Patient expressed understanding regarding what to do in case of urgent or emergency type symptoms.   Medication list was reconciled, printed and provided to the patient in AVS. Patient instructions and summary information was reviewed with the patient as documented in the AVS. This note was prepared with assistance of Dragon voice recognition software. Occasional wrong-word or sound-a-like substitutions may  have occurred due to the inherent limitations of voice recognition software  This visit occurred during the SARS-CoV-2 public health emergency.  Safety protocols were in place, including screening questions prior to the visit, additional usage of staff PPE, and extensive cleaning of exam room while observing appropriate contact time as indicated for disinfecting solutions.

## 2020-07-10 NOTE — Telephone Encounter (Signed)
Pt left VM stating her bp is still low after stopping metoprolol. Pt bp at rest 110/78 but when she was up walking around her kitchen bp dropped to 82/62 and she felt faint. No other complaints at this time.  Routed to [facility] for advice

## 2020-07-10 NOTE — Patient Instructions (Signed)
Please follow up as scheduled for your next visit with me: 10/19/2020   Start Tylenol ES twice a day (1-2 tabs twice a day) and rub voltaren gel up to 4 times day on the sore area.  You have arthritis in this joint and it is causing pain.   Let me know if you'd like to get back to the food doctor.   If you have any questions or concerns, please don't hesitate to send me a message via MyChart or call the office at [phone]. Thank you for visiting with [place] today! It's our pleasure caring for you.

## 2020-07-14 MED ORDER — LOSARTAN POTASSIUM 25 MG PO TABS: 12.5000 mg | ORAL_TABLET | Freq: Every day | ORAL | 1 refills | Status: DC

## 2020-07-14 NOTE — Telephone Encounter (Signed)
Pt aware of medication change. Pt does not wish to have labs drawn at this time states she just had labs and they were normal. Pt will call Friday with an update.

## 2020-07-16 ENCOUNTER — Encounter: Payer: Medicare Other | Admitting: Genetic Counselor

## 2020-07-16 ENCOUNTER — Telehealth: Payer: Self-pay | Admitting: Family Medicine

## 2020-07-16 ENCOUNTER — Other Ambulatory Visit: Payer: Self-pay

## 2020-07-16 DIAGNOSIS — M19072 Primary osteoarthritis, left ankle and foot: Secondary | ICD-10-CM

## 2020-07-16 NOTE — Telephone Encounter (Signed)
Pt is asking for a referral for podiatry due to having foot pain. Pt states her and Dr. Jonni Sanger discussed this. Please advise.

## 2020-07-16 NOTE — Telephone Encounter (Signed)
Referral placed per last OV note

## 2020-07-17 NOTE — Telephone Encounter (Signed)
Pt called stating her bp was "all over the place". Pt said her systolic bp when she woke up was in the 130s after walking around her house it was in the 90's and then later that afternoon it was in the 160s. Pt said when bp is in the 90s she feels really tired and drained.   Routed to [facility] for advice. Med change? 24 hour bp monitor?

## 2020-07-17 NOTE — Telephone Encounter (Signed)
Please get follow up on this. She apparently hasn't gotten an appt yet. thanks

## 2020-07-20 ENCOUNTER — Other Ambulatory Visit: Payer: Self-pay

## 2020-07-20 ENCOUNTER — Ambulatory Visit (INDEPENDENT_AMBULATORY_CARE_PROVIDER_SITE_OTHER): Payer: Medicare Other | Admitting: Internal Medicine

## 2020-07-20 ENCOUNTER — Encounter: Payer: Self-pay | Admitting: Internal Medicine

## 2020-07-20 VITALS — BP 158/84 | HR 84 | Ht 63.5 in | Wt 183.0 lb

## 2020-07-20 DIAGNOSIS — R000 Tachycardia, unspecified: Secondary | ICD-10-CM

## 2020-07-20 DIAGNOSIS — I428 Other cardiomyopathies: Secondary | ICD-10-CM | POA: Diagnosis not present

## 2020-07-20 DIAGNOSIS — I493 Ventricular premature depolarization: Secondary | ICD-10-CM | POA: Diagnosis not present

## 2020-07-20 DIAGNOSIS — I5022 Chronic systolic (congestive) heart failure: Secondary | ICD-10-CM

## 2020-07-20 NOTE — Patient Instructions (Addendum)
Medication Instructions:  1 Stop your Disopyramide Phosphate (Norpace)  *If you need a refill on your cardiac medications before your next appointment, please call your pharmacy*  Lab Work: None ordered.  If you have labs (blood work) drawn today and your tests are completely normal, you will receive your results only by: Marland Kitchen MyChart Message (if you have MyChart) OR . A paper copy in the mail If you have any lab test that is abnormal or we need to change your treatment, we will call you to review the results.  Testing/Procedures: None ordered.  Follow-Up: At [facility], you and your health needs are our priority.  As part of our continuing mission to provide you with exceptional heart care, we have created designated Provider Care Teams.  These Care Teams include your primary Cardiologist (physician) and Advanced Practice Providers (APPs -  Physician Assistants and Nurse Practitioners) who all work together to provide you with the care you need, when you need it.  We recommend signing up for the patient portal called "MyChart".  Sign up information is provided on this After Visit Summary.  MyChart is used to connect with patients for Virtual Visits (Telemedicine).  Patients are able to view lab/test results, encounter notes, upcoming appointments, etc.  Non-urgent messages can be sent to your provider as well.   To learn more about what you can do with MyChart, go to [email or web address].    Your next appointment:   Your physician wants you to follow-up in: 6 months with Dr. Rayann Heman. You will receive a reminder letter in the mail two months in advance. If you don't receive a letter, please call our office to schedule the follow-up appointment.   Other Instructions:

## 2020-07-20 NOTE — Progress Notes (Signed)
PCP: Leamon Arnt, MD Primary Cardiologist: Dr Acie Fredrickson ACHF:  Dr Haroldine Laws Primary EP: Dr Eden Emms is a 74 y.o. female who presents today for routine electrophysiology followup.  Since last being seen in our clinic, the patient reports doing reasonably well.  She has frequent episodes of low BP.  She has not tolerated CHF medicines well.  She is now followed in the advanced heart failure clinic.  Today, she denies symptoms of palpitations, chest pain, shortness of breath,  lower extremity edema, dizziness, presyncope, or syncope.  The patient is otherwise without complaint today.   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[place])    pt states she was told this is congenital   Neurogenic claudication    Palpitations    Recurrent UTI    Seborrheic dermatitis of scalp 03/06/13   Vitamin B 12 deficiency    Past Surgical History:  Procedure Laterality Date   CHOLECYSTECTOMY N/A 08/23/2016   Procedure: LAPAROSCOPIC CHOLECYSTECTOMY;  Surgeon: Rolm Bookbinder, MD;  Location: [facility];  Service: General;  Laterality: N/A;   COLECTOMY Right 1998   large colonic polyp   ENDOSCOPIC VEIN LASER TREATMENT Bilateral 2010-2011   KNEE ARTHROSCOPY W/ MENISCECTOMY Left    RECTOCELE REPAIR  2001   RIGHT/LEFT HEART CATH AND CORONARY ANGIOGRAPHY N/A 03/18/2020   Procedure: RIGHT/LEFT HEART CATH AND CORONARY ANGIOGRAPHY;  Surgeon: Jettie Booze, MD;  Location: [facility];  Service: Cardiovascular;  Laterality: N/A;   SPINE SURGERY     3 since 11/2010, laminectomy, May 2012 lumbar fusion, diskectomy    ROS- all systems are  reviewed and negatives except as per HPI above  Current Outpatient Medications  Medication Sig Dispense Refill   azelastine (ASTELIN) 0.1 % nasal spray Place 2 sprays into both nostrils 2 (two) times daily. 30 mL 12   calcium carbonate (TUMS CALCIUM FOR LIFE BONE) 750 MG chewable tablet Chew 750 mg by mouth every evening.      Cholecalciferol (VITAMIN D3) 2000 units TABS Take 2,000 Units by mouth every evening.      Ciclopirox 1 % shampoo Apply 1 application topically every other day.      disopyramide (NORPACE CR) 150 MG 12 hr capsule Take 1 capsule (150 mg total) by mouth daily. 90 capsule 3   docusate sodium (COLACE) 100 MG capsule Take 100 mg by mouth every evening.      famotidine (PEPCID) 40 MG tablet Take 1 tablet (40 mg total) by mouth daily as needed for heartburn or indigestion. 90 tablet 1   HYDROcodone-acetaminophen (NORCO) 5-325 MG tablet Take 1 tablet by mouth 2 (two) times daily as needed for moderate pain. 60 tablet 0   Hyoscyamine Sulfate SL (LEVSIN/SL) 0.125 MG SUBL Place 1 tablet under the tongue every 6 (six) hours as needed. 120 tablet 0   losartan (COZAAR) 25 MG tablet Take 0.5 tablets (12.5 mg total) by mouth daily. 90 tablet 1   mometasone (ELOCON) 0.1 % lotion Apply 1 application topically as needed for irritation.     Multiple Vitamin (MULTIVITAMIN) tablet Take 1 tablet by  mouth daily.     sodium chloride (MURO 128) 2 % ophthalmic solution Place 1 drop into both eyes at bedtime.      tretinoin (RETIN-A) 0.025 % cream Apply 1 application topically daily as needed (skin bumps).   0   Wheat Dextrin (BENEFIBER PO) Take by mouth daily.      No current facility-administered medications for this visit.    Physical Exam: Vitals:   07/20/20 1235  BP: (!) 158/84  Pulse: 84  SpO2: 98%  Weight: 183 lb (83 kg)  Height: 5' 3.5" (1.613 m)    GEN- The patient is well appearing, alert and oriented x 3 today.   Head- normocephalic, atraumatic Eyes-  Sclera  clear, conjunctiva pink Ears- hearing intact Oropharynx- clear Lungs- Clear to ausculation bilaterally, normal work of breathing Heart- Regular rate and rhythm, no murmurs, rubs or gallops, PMI not laterally displaced GI- soft, NT, ND, + BS Extremities- no clubbing, cyanosis, or edema  Wt Readings from Last 3 Encounters:  07/20/20 183 lb (83 kg)  07/10/20 183 lb 9.6 oz (83.3 kg)  07/01/20 181 lb (82.1 kg)    EKG tracing ordered today is personally reviewed and shows sinus rhythm  Assessment and Plan:  1. PVCs Well controlled  Recent event monitor is reviewed Not the cause for her cardiomyopathy Stop norpace which may be contributing to her low BP and dizziness.  A common side effect of this medicine is hypotension.  It can also worsen CHF.  She has been on this for very many years.  I am not convinced she continues to need this.  2. Combined systolic and diastolic dysfunction/ nonischemic CM/ LV non-compaction noted on MRI (reviewed) RHC/LHV reviewed Dr Gillermina Hu notes are reviewed Stop norpace as above which may allow [place] to further titrate her CHF medicines if her BP improves.  3. Sinus tachycardia improved  Risks, benefits and potential toxicities for medications prescribed and/or refilled reviewed with patient today.   Follow-up with Dr Haroldine Laws as scheduled I will see in 6 months  Thompson Grayer MD, [facility] 07/20/2020 12:44 PM

## 2020-07-20 NOTE — Telephone Encounter (Signed)
Called pt no answer. Left vm for pt to return call.

## 2020-07-22 NOTE — Telephone Encounter (Signed)
Pt aware nurse visit scheduled for tomorrow at [place].

## 2020-07-23 ENCOUNTER — Encounter (HOSPITAL_COMMUNITY): Payer: Self-pay

## 2020-07-23 ENCOUNTER — Ambulatory Visit (HOSPITAL_COMMUNITY)
Admission: RE | Admit: 2020-07-23 | Discharge: 2020-07-23 | Disposition: A | Discharge status: Home / Self Care | Payer: Medicare Other | Source: Ambulatory Visit | Attending: Internal Medicine | Admitting: Internal Medicine

## 2020-07-23 ENCOUNTER — Other Ambulatory Visit: Payer: Self-pay

## 2020-07-23 VITALS — BP 150/80 | HR 85 | Wt 183.4 lb

## 2020-07-23 DIAGNOSIS — I5042 Chronic combined systolic (congestive) and diastolic (congestive) heart failure: Secondary | ICD-10-CM | POA: Diagnosis not present

## 2020-07-23 LAB — COMPREHENSIVE METABOLIC PANEL
ALT: 22 U/L (ref 0–44)
AST: 21 U/L (ref 15–41)
Albumin: 3.8 g/dL (ref 3.5–5.0)
Alkaline Phosphatase: 51 U/L (ref 38–126)
Anion gap: 7 (ref 5–15)
BUN: 11 mg/dL (ref 8–23)
CO2: 27 mmol/L (ref 22–32)
Calcium: 9.2 mg/dL (ref 8.9–10.3)
Chloride: 105 mmol/L (ref 98–111)
Creatinine, Ser: 0.99 mg/dL (ref 0.44–1.00)
GFR calc Af Amer: >60 mL/min (ref >60)
GFR calc non Af Amer: 56 mL/min — ABNORMAL LOW (ref >60)
Glucose, Bld: 109 mg/dL — ABNORMAL HIGH (ref 70–99)
Potassium: 4.8 mmol/L (ref 3.5–5.1)
Sodium: 139 mmol/L (ref 135–145)
Total Bilirubin: 0.6 mg/dL (ref 0.3–1.2)
Total Protein: 6.7 g/dL (ref 6.5–8.1)

## 2020-07-23 LAB — CBC
HCT: 38.4 % (ref 36.0–46.0)
Hemoglobin: 12 g/dL (ref 12.0–15.0)
MCH: 29.2 pg (ref 26.0–34.0)
MCHC: 31.3 g/dL (ref 30.0–36.0)
MCV: 93.4 fL (ref 80.0–100.0)
Platelets: 225 10*3/uL (ref 150–400)
RBC: 4.11 MIL/uL (ref 3.87–5.11)
RDW: 13.3 % (ref 11.5–15.5)
WBC: 5.6 10*3/uL (ref 4.0–10.5)
nRBC: 0 % (ref 0.0–0.2)

## 2020-07-27 NOTE — Progress Notes (Signed)
[facility]    Provider:  Dr Jaynee Eagles Requesting Provider: Scarlette Calico, MD Primary Care Provider:  Leamon Arnt, MD  CC:  Gait abnormality  Interval history July 28, 2020: Here for follow up on  Lumbar spinal stenosis with neurogenic claudication, exam with parkinsonism I have suggested further work-up in the past.  Lumbar spine showed lumbosacral stenosis but patient declined surgery and just wanted to be treated with pain management, I recommended a DaTscan in the past patient has declined she is here for follow-up today.  Gait abnormality likely multifactorial including multiple joint osteoarthritis, lumbar osteoarthritis with radiculopathy, peripheral neuropathy which she was evaluated in the past and likely prediabetic or diabetic. She feels like she is falling apart quickly. She used to walk 7 miles. She has orthostatic hypotettion and sometimes drops to 70 systolic. She has an essential tremor. We discussed getting an MRI of the cervical spine. She is exercising 3x a week. She declines mri c-spine because she wouldn't want surgery anyway.     04/15/2020: She has back pain, she has stenosis and neurogenic claudication, her left leg is weak and when she stands it is painful and if she walk a long time she gets weak and pain and has to sit down. She has been exercising and going to PT, doing everything she can do, pool walking. She had injections I octoer and December. She has tried tylenol, ibuprofen contraindicated, in the past opioids have helped prn, she has tried lyrica and gabapentin, decadron, diclofenac topical, lidocaine patches, we discussed cymbalta but she does not want to take anything regularly prn, robaxin, mobic,  she just wants something as needed and would prefer prn opioids.Discussed the solution would be decompression surgery.  HPI:  Carrie [name] is a 74 y.o. female here as requested by Leamon Arnt, MD for progressive ataxia.  Past medical history  hypertension, hyperlipidemia, former smoker, irritable bowel syndrome, hypoestrogen, B12 deficiency, benign positional vertigo, impaired fasting glucose, peripheral neuropathy, osteoarthritis of the spine with radiculopathy in the lumbar region, chronic renal disease stage III, vitamin D deficiency.In the past year reported worsening dizziness and vertigo, vertigo worsened when she lays down in bed and turns her head to the right, also trouble walking but she thinks is due to chronic knee pain, vertigo is treated with meclizine, chronic numbness in both feet that was previously evaluated by neurologist, also a tremor in her left thumb over the last year.  I did see patient in 2016 for symptoms in her right foot thought it was due to sciatica, tingling in the balls of her feet and some in her toes, her A1c at the time was 6.2-6.4 thought to be due to impaired glucose.  Extensive lab testing was otherwise unremarkable.  She had knee surgery and numerous problems, she was in bed for 7-8 weeks. When she got up she was weak. She was also having dizziness at the same time. He recommended she see [place] but she thought it was her knee and disuse. She had a +epley and dix-hallpike she sleeps on her left side not on her right side because it makes her dizzy. She still has neuropathy in the feet. Her feet started turning red. She feels she has improved, she is not using a cane, she had surgery in January in the knee and she has stenosis in her back. She can't bike anymore, she is afraid she will fall, she bikes in the house. Over the last improved since surgery but  still overall decline in mobility, no falls, no difficulty swallowing, she has dizziness which has been diagnosed as BPPV. No weakness. She feels slower. No other focal neurologic deficits, associated symptoms, inciting events or modifiable factors.  Reviewed notes, labs and imaging from outside physicians, which showed:  Reviewed MRI brain 05/2019 and agree with  the following: White matter hyperintensities most commonly indicating chronic ischemic microangiopathy. No acute or reversible intracranial abnormality.  I reviewed Dr. Ronnald Ramp notes.  In the past she is complained of worsening dizziness and vertigo, vertigo worsened when she lays down in bed and turns her head to the right, also trouble walking but she thinks is due to chronic knee pain, vertigo is treated with meclizine, chronic numbness in both feet that was previously evaluated by neurologist, also a tremor in her left thumb over the last year.  I did see patient in 2016 for symptoms in her right foot thought it was due to sciatica, tingling in the balls of her feet and some in her toes, her A1c at the time was 6.2-6.4 thought to be due to impaired glucose.  Extensive lab testing was otherwise unremarkable.  I reviewed labs which included normal TSH, and in 2016 extensive labs for neuropathy all unremarkable.  Review of Systems: Patient complains of symptoms per HPI as well as the following symptoms: gait problems, chronic back and neck pain. Pertinent negatives and positives per HPI. All others negative   Social History   Socioeconomic History  . Marital status: Married    Spouse name: Not on file  . Number of children: 2  . Years of education: 35  . Highest education level: Not on file  Occupational History  . Occupation: Retired Therapist, sports  Tobacco Use  . Smoking status: Former Smoker    Quit date: 02/02/2013    Years since quitting: 7.4  . Smokeless tobacco: Never Used  Vaping Use  . Vaping Use: Never used  Substance and Sexual Activity  . Alcohol use: Yes    Alcohol/week: 3.0 - 5.0 standard drinks    Types: 3 - 5 Glasses of wine per week  . Drug use: No  . Sexual activity: Yes  Other Topics Concern  . Not on file  Social History Narrative   Lives in [place] with spouse at [facility]    Previously Volunteered as a Contractor   Caffeine use: none,  quit at least 30 years ago   Social Determinants of Radio broadcast assistant Strain:   . Difficulty of Paying Living Expenses: Not on file  Food Insecurity:   . Worried About Charity fundraiser in the Last Year: Not on file  . Ran Out of Food in the Last Year: Not on file  Transportation Needs:   . Lack of Transportation (Medical): Not on file  . Lack of Transportation (Non-Medical): Not on file  Physical Activity:   . Days of Exercise per Week: Not on file  . Minutes of Exercise per Session: Not on file  Stress:   . Feeling of Stress : Not on file  Social Connections:   . Frequency of Communication with Friends and Family: Not on file  . Frequency of Social Gatherings with Friends and Family: Not on file  . Attends Religious Services: Not on file  . Active Member of Clubs or Organizations: Not on file  . Attends Archivist Meetings: Not on file  . Marital Status: Not on file  Intimate Partner Violence:   .  Fear of Current or Ex-Partner: Not on file  . Emotionally Abused: Not on file  . Physically Abused: Not on file  . Sexually Abused: Not on file    Family History  Problem Relation Age of Onset  . Hypertension Mother   . High Cholesterol Mother   . Cancer Father        head and neck  . Hypertension Sister   . Hypertension Sister   . Diabetes type II Sister        was on steroids   . Hypertension Sister   . Hypertension Sister   . Cancer Sister        bladder  . Early death Neg Hx   . Diabetes Neg Hx   . Heart disease Neg Hx   . Kidney disease Neg Hx   . Stroke Neg Hx   . Breast cancer Neg Hx   . Parkinson's disease Neg Hx     Past Medical History:  Diagnosis Date  . BPPV (benign paroxysmal positional vertigo), bilateral 04/11/2019  . CHF (congestive heart failure) ([place])   . Colon polyps   . Esophageal spasm   . Fatigue   . Frequent PVCs   . GERD (gastroesophageal reflux disease)   . IBS (irritable bowel syndrome)   . Lumbar stenosis     "around L4,5, somewhere in there"  . 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 Neurogenic claudication   . Palpitations   . Recurrent UTI   . Seborrheic dermatitis of scalp 03/06/13  . Vitamin B 12 deficiency     Patient Active Problem List   Diagnosis Date Noted  . Primary osteoarthritis of left foot 07/10/2020  . Spinal stenosis of lumbar region with neurogenic claudication 04/15/2020  . Chronic bilateral low back pain with sciatica 04/15/2020  . Chronic combined systolic and diastolic CHF (congestive heart failure) ([place]) 03/27/2020  . Gait abnormality 10/14/2019  . Mixed hyperlipidemia 07/31/2019  . Vitamin D deficiency disease 05/01/2019  . Chronic renal disease, stage 3, moderately decreased glomerular filtration rate (GFR) between 30-59 mL/min/1.73 square meter 05/01/2019  . History of adenomatous polyp of colon 07/17/2018  . Bladder prolapse, female, acquired 09/21/2017  . IFG (impaired fasting glucose) 10/21/2016  . Gastro-esophageal reflux disease without esophagitis 10/20/2016  . Idiopathic peripheral neuropathy 04/08/2016  . Osteoarthritis of spine with radiculopathy, lumbar region 11/24/2015  . Osteopenia 05/28/2014  . Essential hypertension 03/05/2014  . Irritable bowel syndrome (IBS) 03/27/2013  . Former smoker 03/06/2013  . Seborrheic dermatitis of scalp 03/06/2013  . Allergic rhinitis 01/03/2012  . Symptomatic PVCs 01/03/2012    Past Surgical History:  Procedure Laterality Date  . CHOLECYSTECTOMY N/A 08/23/2016   Procedure: LAPAROSCOPIC CHOLECYSTECTOMY;  Surgeon: Rolm Bookbinder, MD;  Location: [facility];  Service: General;  Laterality: N/A;  . COLECTOMY Right 1998   large colonic polyp  . ENDOSCOPIC VEIN LASER TREATMENT Bilateral 2010-2011  . KNEE ARTHROSCOPY W/ MENISCECTOMY Left   . [place]  2001  . RIGHT/LEFT HEART CATH AND CORONARY ANGIOGRAPHY N/A 03/18/2020   Procedure: RIGHT/LEFT HEART CATH AND  CORONARY ANGIOGRAPHY;  Surgeon: Jettie Booze, MD;  Location: [facility];  Service: Cardiovascular;  Laterality: N/A;  . SPINE SURGERY     3 since 11/2010, laminectomy, May 2012 lumbar fusion, diskectomy    Current Outpatient Medications  Medication Sig Dispense Refill  . azelastine (ASTELIN) 0.1 % nasal spray Place 2 sprays into both nostrils  2 (two) times daily. (Patient taking differently: Place 2 sprays into both nostrils 2 (two) times daily. As needed) 30 mL 12  . calcium carbonate (TUMS CALCIUM FOR LIFE BONE) 750 MG chewable tablet Chew 750 mg by mouth every evening.     . Cholecalciferol (VITAMIN D3) 2000 units TABS Take 2,000 Units by mouth every evening.     . Ciclopirox 1 % shampoo Apply 1 application topically every other day.     . docusate sodium (COLACE) 100 MG capsule Take 100 mg by mouth every evening.     . famotidine (PEPCID) 40 MG tablet Take 1 tablet (40 mg total) by mouth daily as needed for heartburn or indigestion. 90 tablet 1  . HYDROcodone-acetaminophen (NORCO) 5-325 MG tablet Take 1 tablet by mouth 2 (two) times daily as needed for moderate pain. 60 tablet 0  . Hyoscyamine Sulfate SL (LEVSIN/SL) 0.125 MG SUBL Place 1 tablet under the tongue every 6 (six) hours as needed. 120 tablet 0  . mometasone (ELOCON) 0.1 % lotion Apply 1 application topically as needed for irritation.    . Multiple Vitamin (MULTIVITAMIN) tablet Take 1 tablet by mouth daily.    . sodium chloride (MURO 128) 2 % ophthalmic solution Place 1 drop into both eyes at bedtime.     . tretinoin (RETIN-A) 0.025 % cream Apply 1 application topically daily as needed (skin bumps).   0  . Wheat Dextrin (BENEFIBER PO) Take by mouth daily.      No current facility-administered medications for this visit.    Allergies as of 07/28/2020 - Review Complete 07/28/2020  Allergen Reaction Noted  . Phenergan [promethazine hcl] Shortness Of Breath and Other (See Comments) 08/22/2016  . Erythromycin  Diarrhea and Nausea And Vomiting 12/25/2013  . Azithromycin Other (See Comments) 12/25/2013  . Epinephrine Other (See Comments) 12/25/2013  . Nitrofurantoin Nausea Only and Other (See Comments) 12/25/2013    Vitals: Ht 5' 3.5" (1.613 m)   Wt 184 lb (83.5 kg)   BMI 32.08 kg/m  Last Weight:  Wt Readings from Last 1 Encounters:  07/28/20 184 lb (83.5 kg)   Last Height:   Ht Readings from Last 1 Encounters:  07/28/20 5' 3.5" (1.613 m)   Exam Stable  Physical exam: Exam: Gen: NAD, conversant, well nourised, obese, well groomed                     Eyes: Conjunctivae clear without exudates or hemorrhage  Neuro: Repeated, Stable Detailed Neurologic Exam  Speech:    Speech is normal; fluent and spontaneous with normal comprehension.  Cognition:    The patient is oriented to person, place, and time;     recent and remote memory intact;     language fluent;     normal attention, concentration,     fund of knowledge Cranial Nerves: hypomimia.    The pupils are equal, round, and reactive to light.  Saccadic pursuit. Pupils too small to visual fundi. Visual fields are full to finger confrontation. Extraocular movements are intact. Trigeminal sensation is intact and the muscles of mastication are normal. Slight ptosis on the left otherwise the face is symmetric. The palate elevates in the midline. Hearing intact. Voice is normal. Shoulder shrug is normal. The tongue has normal motion without fasciculations.   Coordination:    No dysmetria, bradykinesia  Gait:    Slightly decreased arm swing in the right arm as compared to the left  Motor Observation:    Postural tremor  on the right Tone:    Normal muscle tone.  No cogwheeling  Posture:    Slightly bent at the waist    Strength:    Strength is V/V in the upper and lower limbs.      Sensation: intact to LT, proprioception intact in the great toes.      Reflex Exam:  DTR's:    Deep tendon reflexes in the upper and lower  extremities are trace AJs, 1+ patellars, 2+ biceps bilaterally.   Toes:    The toes are equivocal bilaterally.   Clonus:    Clonus is absent.    Assessment/Plan:   74 y.o. female here as requested by Leamon Arnt, MD for gait abnormality and low back pain with stenosis and neurogenic claudication.  Past medical history hypertension, hyperlipidemia, former smoker, irritable bowel syndrome, hypoestrogen, B12 deficiency, benign positional vertigo, impaired fasting glucose, peripheral neuropathy, osteoarthritis of the spine with radiculopathy in the lumbar region, chronic renal disease stage III, vitamin D deficiency.  - She has parkinsonism I suspect PD, today we had a long talk about Parkinson's disease and parkinsonism, I highly recommended a DaTscan, if positive we can send her to [facility] to discuss DBS, I am not sure patient would tolerate any of the Parkinson's medications we currently have due to her orthostatic hypotension I may recommend DBS for patient we would send to [facility] for evaluation.  We watched the videos online together of Parkinson's disease and DBS.  - - Lumbar spinal stenosis with neurogenic claudication: the best solution is decompressive surgery.   She has been exercising and going to PT, doing everything she can do, pool walking. She had injections in October and December. She has tried tylenol, ibuprofen contraindicated, in the past opioids have helped prn, she has tried lyrica and gabapentin, decadron, diclofenac topical, lidocaine patches, we discussed cymbalta but she does not want to take anything regularly, robaxin, mobic,  she just wants something as needed and would prefer prn opioids. Will refer to pain clinic.  - Discussed MRI cervical spine, she declined, but she agreed to a DAT Scan to differentiate bewteen parkinson;s disease verses essential tremor or gait apraxia (no significant white matter changes on MRI brain however)  - otherwise Gait abnormality  likely multifactorial including osteoarthritis in the knees and other multiple joints, lumbar osteoarthritis with radiculopathy, peripheral neuropathy for which she was evaluated in the past and likely pre-diabetic/diabetic nothing else found  - BPPV: vestibular therapy ordered in the past  Orders Placed This Encounter  Procedures  . NM BRAIN DATSCAN TUMOR LOC INFLAM SPECT 1 DAY    Cc: Leamon Arnt, MD,   Scarlette Calico, MD  Sarina Ill, MD  [facility] [address] [place], [place] [address]  Phone [phone] Fax [phone]  I spent more than 40 minutes of face-to-face and non-face-to-face time with patient on the  1. Parkinsonism, unspecified Parkinsonism type ([place])   2. Gait abnormality   3. Tremor    diagnosis.  This included previsit chart review, lab review, study review, order entry, electronic health record documentation, patient education on the different diagnostic and therapeutic options, counseling and coordination of care, risks and benefits of management, compliance, or risk factor reduction

## 2020-07-28 ENCOUNTER — Ambulatory Visit (INDEPENDENT_AMBULATORY_CARE_PROVIDER_SITE_OTHER): Payer: Medicare Other | Admitting: Neurology

## 2020-07-28 ENCOUNTER — Other Ambulatory Visit: Payer: Self-pay

## 2020-07-28 ENCOUNTER — Encounter: Payer: Self-pay | Admitting: Neurology

## 2020-07-28 ENCOUNTER — Telehealth: Payer: Self-pay | Admitting: Family Medicine

## 2020-07-28 VITALS — Ht 63.5 in | Wt 184.0 lb

## 2020-07-28 DIAGNOSIS — R251 Tremor, unspecified: Secondary | ICD-10-CM

## 2020-07-28 DIAGNOSIS — R269 Unspecified abnormalities of gait and mobility: Secondary | ICD-10-CM | POA: Diagnosis not present

## 2020-07-28 DIAGNOSIS — G20 Parkinson's disease: Secondary | ICD-10-CM

## 2020-07-28 NOTE — Progress Notes (Signed)
  Chronic Care Management   Outreach Note  07/28/2020 Name: Carrie [name] MRN: [id number] DOB: 1946/06/30  Referred by: Leamon Arnt, MD Reason for referral : No chief complaint on file.   An unsuccessful telephone outreach was attempted today. The patient was referred to the pharmacist for assistance with care management and care coordination.   Follow Up Plan:   Earney [place] Upstream Scheduler

## 2020-07-28 NOTE — Patient Instructions (Signed)
Brain DaTscan How to prepare and what to expect What is a brain DaTscan? A brain DaTscan is a nuclear medicine scan. It uses radioactive material to diagnose some diseases of the brain, especially those that cause tremor (shakiness). DaTscan is a brand name for a drug called ioflupane I-123. A brain DaTscan is a form of radiology, because radiation is used to take pictures of the body. This radioactive drug is ordered especially for you. Because of this, we need at least 72 hours' notice if you must cancel or reschedule your scan.   How does the scan work? You will be given a small dose of tracer (radioactive material) through an intravenous (IV) line. This tracer will collect in part of your brain and give off gamma rays. A special camera called a gamma camera will use these rays to produce pictures and measurements of your brain. How do I prepare? Some drugs will affect the results of your brain DaTscan. You will need to stop taking these drugs before your scan. The table on page 2 lists the drugs that need to be stopped, and for how many days before your scan. This list is in alphabetical order by the generic name of the drug. The common brand names are listed beneath the generic name. Please confirm these instructions with your doctor who prescribed the drug. Drugs to Stop Taking Before your scan, stop taking these medicines for the length of time shown: Name of Drug Stop Taking  Amoxapine 4 days before  Benztropine  Cogentin 3 days before  Bupropion (Aplenzin, Budeprion, Voxra, Wellbutrin, Zyban) 48 hours before  Buspirone 15 hours before  Citalopram 24 hours before  Cocaine 6 hours before  Escitalopram 24 hours before  Methamphetamine 24 hours before  Methylphenidate (Concerta, Metadate, Methylin, Ritalin) 20 hours before  Paroxetine 24 hours before  Selegilene 48 hours before  Sertraline 3 days before  If you are breastfeeding, or if there is any chance you are pregnant, please  tell the scheduler or technologist (the person who will help you prepare for your scan). How is the scan done? When you first arrive, we will ask you to drink a small cup of water with potassium iodine in it. This water may have a metallic taste.  An hour after you drink the potassium iodine water, the technologist will inject a small amount of tracer into a vein in your arm or hand through your IV.  You must stay in the department for 30 minutes after the injection.  You will then have a break for 3 hours. It is OK to eat and drink during this break.  You must return to the clinic after this 3-hour break to have images of your brain taken.  Then, 4 hours after you receive your tracer injection, the technologist will take images of your brain with the gamma camera. You will lie flat on the exam table while these images are being taken.  You must not move while the camera is taking pictures. If you move, the pictures will be blurry and may have to be taken again.  Taking the images will take 40 to 45 minutes. Your total time in the imaging room will be about 1 hour.  You may also have a low-dose CT scan of your brain to help confirm any results. A CT scan is another way to take images inside your body.  It will take about 5 hours from the time you drink the potassium iodine water until the scans are   complete. What will I feel during the scan? The technologist will help make you as comfortable as possible on the exam table for the scan.  You may feel some minor discomfort from the IV.  Lying still on the exam table may be hard for some patients.  The camera will be close to your head. This may make you feel confined or uneasy (claustrophobic). Please tell the doctor who referred you for this scan if you know you are claustrophobic. Are there any side effects from the scan? Most of the radioactivity from the tracer will pass out of your body in your urine or stool. The rest simply goes away over  time.  Bad reactions to this scan are very rare. Fewer than 1% of patients (fewer than 1 out of 100) have a bad reaction. Reactions may include headache, nausea, vertigo (dizziness), or dry mouth. How do I get the results? When the test is over, the nuclear medicine doctor will review your images, prepare a written report, and talk with your doctor about the results. Your doctor will then talk with you about the results and your treatment options. If you needed to stop taking any medicines on the day of your scan, ask your doctor when to start taking them again.  The potentially interfering drugs consist of: amoxapine, amphetamine, benztropine, bupropion, buspirone, citalopram, cocaine, mazindol, methamphetamine, methylphenidate, norephedrine, phentermine, escitalopram,  phenylpropanolamine, selegiline, paroxetine, and sertraline   

## 2020-07-30 ENCOUNTER — Other Ambulatory Visit: Payer: Self-pay

## 2020-07-30 ENCOUNTER — Telehealth (HOSPITAL_COMMUNITY): Payer: Self-pay

## 2020-07-30 ENCOUNTER — Ambulatory Visit (INDEPENDENT_AMBULATORY_CARE_PROVIDER_SITE_OTHER): Payer: Medicare Other | Admitting: Podiatry

## 2020-07-30 ENCOUNTER — Encounter: Payer: Self-pay | Admitting: Podiatry

## 2020-07-30 ENCOUNTER — Ambulatory Visit: Payer: Medicare Other

## 2020-07-30 DIAGNOSIS — M76822 Posterior tibial tendinitis, left leg: Secondary | ICD-10-CM | POA: Diagnosis not present

## 2020-07-30 NOTE — Progress Notes (Signed)
Subjective:  Patient ID: Carrie [name], female    DOB: 29-Jan-1946,  MRN: [id number] HPI Chief Complaint  Patient presents with   Foot Pain    Medial foot left - aching x 6 weeks, went on a trip where she was getting in and out a Lucianne Lei and relates this because of that, PCP xrayed - said arthritis, using voltaren gel - xrays viewable in [facility] 06/16/20   New Patient (Initial Visit)    Est pt 2017    74 y.o. female presents with the above complaint.   ROS: Denies fever chills nausea vomiting muscle aches pains calf pain back pain chest pain shortness of breath.  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   Neurogenic claudication    Palpitations    Recurrent UTI    Seborrheic dermatitis of scalp 03/06/13   Vitamin B 12 deficiency    Past Surgical History:  Procedure Laterality Date   CHOLECYSTECTOMY N/A 08/23/2016   Procedure: LAPAROSCOPIC CHOLECYSTECTOMY;  Surgeon: Rolm Bookbinder, MD;  Location: [facility];  Service: General;  Laterality: N/A;   COLECTOMY Right 1998   large colonic polyp   ENDOSCOPIC VEIN LASER TREATMENT Bilateral 2010-2011   KNEE ARTHROSCOPY W/ MENISCECTOMY Left    RECTOCELE REPAIR  2001   RIGHT/LEFT HEART CATH AND CORONARY ANGIOGRAPHY N/A 03/18/2020   Procedure: RIGHT/LEFT HEART CATH AND CORONARY ANGIOGRAPHY;  Surgeon: Jettie Booze, MD;  Location: [facility];  Service: Cardiovascular;  Laterality: N/A;   SPINE SURGERY     3 since 11/2010, laminectomy, May 2012 lumbar fusion, diskectomy    Current Outpatient Medications:    azelastine  (ASTELIN) 0.1 % nasal spray, Place 2 sprays into both nostrils 2 (two) times daily. (Patient taking differently: Place 2 sprays into both nostrils 2 (two) times daily. As needed), Disp: 30 mL, Rfl: 12   calcium carbonate (TUMS CALCIUM FOR LIFE BONE) 750 MG chewable tablet, Chew 750 mg by mouth every evening. , Disp: , Rfl:    Cholecalciferol (VITAMIN D3) 2000 units TABS, Take 2,000 Units by mouth every evening. , Disp: , Rfl:    Ciclopirox 1 % shampoo, Apply 1 application topically every other day. , Disp: , Rfl:    docusate sodium (COLACE) 100 MG capsule, Take 100 mg by mouth every evening. , Disp: , Rfl:    famotidine (PEPCID) 40 MG tablet, Take 1 tablet (40 mg total) by mouth daily as needed for heartburn or indigestion., Disp: 90 tablet, Rfl: 1   HYDROcodone-acetaminophen (NORCO) 5-325 MG tablet, Take 1 tablet by mouth 2 (two) times daily as needed for moderate pain., Disp: 60 tablet, Rfl: 0   Hyoscyamine Sulfate SL (LEVSIN/SL) 0.125 MG SUBL, Place 1 tablet under the tongue every 6 (six) hours as needed., Disp: 120 tablet, Rfl: 0   mometasone (ELOCON) 0.1 % lotion, Apply 1 application topically as needed for irritation., Disp: , Rfl:    Multiple Vitamin (MULTIVITAMIN) tablet, Take 1 tablet by mouth daily., Disp: , Rfl:    sodium chloride (MURO 128) 2 % ophthalmic  solution, Place 1 drop into both eyes at bedtime. , Disp: , Rfl:    tretinoin (RETIN-A) 0.025 % cream, Apply 1 application topically daily as needed (skin bumps). , Disp: , Rfl: 0   Wheat Dextrin (BENEFIBER PO), Take by mouth daily. , Disp: , Rfl:   Allergies  Allergen Reactions   Phenergan [Promethazine Hcl] Shortness Of Breath and Other (See Comments)    Pt states she got to where she could not breath when she got iv phenergan after surgery   Erythromycin Diarrhea and Nausea And Vomiting   Azithromycin Other (See Comments)    Including macrobid  (weakness)   Epinephrine Other (See Comments)    Heart arrythmias    Nitrofurantoin Nausea Only and Other (See Comments)    Weakness   Review of Systems Objective:  There were no vitals filed for this visit.  General: Well developed, nourished, in no acute distress, alert and oriented x3   Dermatological: Skin is warm, dry and supple bilateral. Nails x 10 are well maintained; remaining integument appears unremarkable at this time. There are no open sores, no preulcerative lesions, no rash or signs of infection present.  Vascular: Dorsalis Pedis artery and Posterior Tibial artery pedal pulses are 2/4 bilateral with immedate capillary fill time. Pedal hair growth present. No varicosities and no lower extremity edema present bilateral.   Neruologic: Grossly intact via light touch bilateral. Vibratory intact via tuning fork bilateral. Protective threshold with Semmes Wienstein monofilament intact to all pedal sites bilateral. Patellar and Achilles deep tendon reflexes 2+ bilateral. No Babinski or clonus noted bilateral.   Musculoskeletal: No gross boney pedal deformities bilateral. No pain, crepitus, or limitation noted with foot and ankle range of motion bilateral. Muscular strength 5/5 in all groups tested bilateral.  The majority of her pain is on palpation of the posterior tibial tendon near the navicular.  The majority of the pain is between the navicular tuberosity and then medial malleolus left foot.  There is some fluctuance on palpation and here no pulsatile mass.  Gait: Unassisted, Nonantalgic.    Radiographs:  Radiographs reviewed today do not demonstrate any type of osseous abnormalities other than osteoarthritis at the dorsal aspect of the navicular cuneiform joint with dorsal spurring.  Assessment & Plan:   Assessment: Posterior tibial tendinitis left.  Plan: Discussed etiology pathology conservative surgical therapies at this point I placed her in a cam walker and provided 10 mg injection to the point of maximal tenderness making sure not to  inject into the tendon.  She tolerated procedure well without complications I will follow-up with her in 3 to 4 weeks.  If not improved MRI will be necessary.     Floree Zuniga T. Thurman, [place]

## 2020-07-30 NOTE — Telephone Encounter (Signed)
Patient called to report her BP's. Blood pressures are listed below:  (8/22)Standing 81/67,Sitting 106/77 (8/23)Standing 97/71,Sitting 125/83 (8/24)Standing 98/73,Sitting 125/85 (8/25)Standing 99/73,Sitting 120/79 (8/26)Standing 76/63, Sitting 118/79.    She reports that she has been wearing her support hoses but still doesn't see a change. She also states she doesn't think the waist belt is going to help. Please advise

## 2020-07-31 NOTE — Telephone Encounter (Signed)
Patient advised and verbalized understanding. Patient will try getting BP supine position, will hold on starting midodrine for now.

## 2020-08-04 ENCOUNTER — Telehealth: Payer: Self-pay | Admitting: Neurology

## 2020-08-04 NOTE — Telephone Encounter (Signed)
I spoke with the pt. She states she now has a walking boot. She is no longer using the walking pole. She is feeling stronger, things are looking pretty good, and prefers (if ok with Dr Jaynee Eagles) to wait and see how she develops. She has had a lot going on between her back and her foot. Pt thinks she has gotten out of shape from not exercising, but doesn't feel terribly weak. She was not sure what all symptoms Dr Jaynee Eagles felt were parkinsonian other than her arm not swinging while walking. She stated she doesn't have a tremor. Pt said she will be vigilant of her symptoms and her husband also keeps a close watch on her. She stated if it's PD it will either progress or she suspects she will show new symptoms. Pt asked to schedule a 3-4 month follow up early December so I scheduled her for [facility] 12/7 @ 1 pm. She would like to know what Dr Jaynee Eagles thinks. Pt verbalized appreciation for the call.

## 2020-08-04 NOTE — Telephone Encounter (Signed)
Patient called and she wants to hold of on DAT scan for now.  Patient also wants a call from Dr. Jaynee Eagles to call her she has clinical questions that I could not answer.  [place]

## 2020-08-04 NOTE — Telephone Encounter (Signed)
I let pt know that Dr Jaynee Eagles is agreeable to the pt's preferred plan of care. She verbalized appreciation and will call back if needed.

## 2020-08-04 NOTE — Telephone Encounter (Signed)
That is fine with me-- thanks  

## 2020-08-06 ENCOUNTER — Telehealth (HOSPITAL_COMMUNITY): Payer: Self-pay

## 2020-08-06 NOTE — Telephone Encounter (Signed)
Patient called with updated blood pressure readings,she also stated that she has been wearing the compression hoses as well. She states that she has been feeling ok but just concerned about elevated BP's.   Laying:  Sitting: Standing: (8/28) 142/75  118/71  101/66 (8/29) 164/78  158/88  137/81 (8/30) 162/80  138/86  116/77 (8/31) 157/87  145/87  116/77 (9/1) 156/86  142/88  107/77 (9/2) 150/81  139/87  111/76   laying blood pressures were taking in the morning as soon as patient woke up.   Please advise

## 2020-08-07 ENCOUNTER — Telehealth (INDEPENDENT_AMBULATORY_CARE_PROVIDER_SITE_OTHER): Payer: Medicare Other | Admitting: Podiatry

## 2020-08-07 ENCOUNTER — Telehealth: Payer: Self-pay | Admitting: Podiatry

## 2020-08-07 NOTE — Telephone Encounter (Signed)
Pt doesn't believe her boot is fitting/working properly. I told her to come in and someone would take a look at it for her.

## 2020-08-07 NOTE — Telephone Encounter (Signed)
Patient Carrie [name] stating she is having problems with her boot. Strap keeps coming off and glue comes off. Would like to come in and have someone look at it before she goes out of town next week.

## 2020-08-07 NOTE — Telephone Encounter (Signed)
Patient presents to the office for fitting of new boot. The strap was peeling away from the side and the valve for the pump wouldn't hold. Dispensed new boot to patient.

## 2020-08-11 DIAGNOSIS — D2272 Melanocytic nevi of left lower limb, including hip: Secondary | ICD-10-CM | POA: Diagnosis not present

## 2020-08-11 DIAGNOSIS — D225 Melanocytic nevi of trunk: Secondary | ICD-10-CM | POA: Diagnosis not present

## 2020-08-11 DIAGNOSIS — L218 Other seborrheic dermatitis: Secondary | ICD-10-CM | POA: Diagnosis not present

## 2020-08-11 DIAGNOSIS — Z85828 Personal history of other malignant neoplasm of skin: Secondary | ICD-10-CM | POA: Diagnosis not present

## 2020-08-11 DIAGNOSIS — D2271 Melanocytic nevi of right lower limb, including hip: Secondary | ICD-10-CM | POA: Diagnosis not present

## 2020-08-11 DIAGNOSIS — L281 Prurigo nodularis: Secondary | ICD-10-CM | POA: Diagnosis not present

## 2020-08-11 DIAGNOSIS — L821 Other seborrheic keratosis: Secondary | ICD-10-CM | POA: Diagnosis not present

## 2020-08-16 ENCOUNTER — Other Ambulatory Visit: Payer: Self-pay | Admitting: Family Medicine

## 2020-08-16 DIAGNOSIS — K219 Gastro-esophageal reflux disease without esophagitis: Secondary | ICD-10-CM

## 2020-08-20 ENCOUNTER — Other Ambulatory Visit: Payer: Self-pay

## 2020-08-20 ENCOUNTER — Ambulatory Visit (INDEPENDENT_AMBULATORY_CARE_PROVIDER_SITE_OTHER): Payer: Medicare Other | Admitting: Podiatry

## 2020-08-20 ENCOUNTER — Encounter: Payer: Self-pay | Admitting: Podiatry

## 2020-08-20 DIAGNOSIS — M76822 Posterior tibial tendinitis, left leg: Secondary | ICD-10-CM | POA: Diagnosis not present

## 2020-08-20 NOTE — Progress Notes (Signed)
She presents today for follow-up of her posterior tibial tendinitis states that she is approximately 80% improved she states is doing a lot better and very really helped but it bothered her back because of her spinal stenosis.  She states that I wonder if another shot would work.  Objective: Vital signs are stable she is alert and oriented x3 she has pain on palpation of the posterior tibial tendon left pulses remain palpable.  On the point of maximal tenderness of the left posterior tibial tendon is just beneath the medial malleolus.  Assessment: Posterior tibial tendinitis.  80% resolved.  Plan: Injected the area today with dexamethasone local anesthetic extra Betadine skin prep tolerated procedure well without complications follow-up with her as needed.

## 2020-09-08 ENCOUNTER — Ambulatory Visit (INDEPENDENT_AMBULATORY_CARE_PROVIDER_SITE_OTHER): Payer: Medicare Other | Admitting: Podiatry

## 2020-09-08 ENCOUNTER — Other Ambulatory Visit: Payer: Self-pay

## 2020-09-08 ENCOUNTER — Encounter: Payer: Self-pay | Admitting: Podiatry

## 2020-09-08 DIAGNOSIS — M7752 Other enthesopathy of left foot: Secondary | ICD-10-CM | POA: Diagnosis not present

## 2020-09-08 NOTE — Progress Notes (Signed)
She presents today states that my posterior tibial tendinitis is doing much better but heard over here she points to the sinus tarsi putting her finger right on the sinus tarsi of her left foot.  She denies any trauma.  Objective: Vital signs are stable oriented x3 pulses are palpable.  She has no pain on palpation of the left foot medial aspect.  However she does have pain on end range of motion of the subtalar joint and direct palpation of the sinus tarsi of the left foot.  Assessment: Well-healing posterior tibial tendon dysfunction and tendinitis.  Sinus tarsitis subtalar joint capsulitis left.  Plan: At this point I injected 10 mg of Kenalog 5 mg Marcaine point of maximal tenderness of her left foot.  Tolerated procedure well without complications follow-up with me in 1 month if not improved.

## 2020-09-10 ENCOUNTER — Ambulatory Visit: Payer: Medicare Other | Admitting: Podiatry

## 2020-09-15 DIAGNOSIS — Z23 Encounter for immunization: Secondary | ICD-10-CM | POA: Diagnosis not present

## 2020-09-17 ENCOUNTER — Ambulatory Visit: Payer: Medicare Other | Admitting: Genetic Counselor

## 2020-09-17 ENCOUNTER — Other Ambulatory Visit: Payer: Self-pay

## 2020-09-22 ENCOUNTER — Other Ambulatory Visit: Payer: Self-pay | Admitting: Family Medicine

## 2020-09-29 NOTE — Progress Notes (Signed)
Referring Provider: Glori Bickers, MD  Referral Reason  Carrie [name] was referred for genetic consult and testing of left ventricular noncompaction cardiomyopathy (LVNC)  Carrie [name] (III.4 on pedigree) is a 74 year-old Caucasian woman who was found to have left ventricular non-compacted cardiomyopathy ([place]:C of 2.4 at lateral wall and 4.5 at apex)  by cardiac MRI subsequent in April, 2021 with EF of 42%. She reports having PVCs at age 70 and being treated for this. She tells me of having heart palpitations in 2019 and no other symptoms.  Family history Paraskevi (III.4) has two healthy daughters, ages 45 (IV.5) and 39 (IV.6). Her elder daughter had a normal echocardiogram at age 66. The younger daughter leads a very active lifestyle- climbed [place].  Carrie [name]'s eldest sister (III.1), age 59 has SVT and diastolic dysfunction-grade 2. Her cardiac echo of 2020 showed an EF of 55-60%. She has three other sisters, (III.2, III.4, III.5)- all in good health as are their children (IV.1-IV.9).  There is no history of heart disease in her mother (II.6) who lived to 16 and, her siblings (II.7-II.9). Her maternal grandfather (I.3) died at 27 of heart attack and maternal grandmother (I.4) of obstructed gall bladder in her 65s/80s.  Carrie [name]'s father (II.5) died AT 46 from cancer. No history of heart disease in his sisters (II.2-II.4) who died of cancer, COPD and old age. She does report his brother (II.1) dying suddenly at age 38 from a suspected heart attack. Also reports sudden death in his father (I.1) at age 75.   Genetics Carrie [name] was counseled on the genetics of isolated left ventricular noncompaction. I explained to her that LVNC can be associated with other syndromes such as Barth syndrome, mitochondrial disorders and myotonic dystrophy, or concomitantly present with hypertrophic and dilated cardiomyopathy. However, the major genetic cause for familial noncompaction has yet  to be identified. I explained to her that there is extreme variability of the LVNC morphological spectrum and that it may also occur as a benign anatomic variant of left ventricular structure instead of a type of heart disease. She verbalized understanding of this.   I discussed the genetics of LVNC and explained that depending upon the underlying cause it may have X-linked or autosomal inheritance. We walked through the process of genetic testing. Some studies have reported variants in sarcomeric genes in 29% to 41% of adult patients with LVNC, with a recent report showing only 9% yield of an actionable variant with LVNC genetic testing  Impression and Plan  In summary, Carrie [name] presents with LVNC at age 12. In the absence of a family history of sudden death or a cardiomyopathy in her first-degree relatives, her LVNC is an isolated event. Genetic testing is most beneficial in LVNC associated with other cardiac and syndromic features to confirm the diagnosis and is least useful in adults with isolated LVNC without a family history. In light of this, the clinical utility of genetic testing is very low. She verbalized understanding of this and would like to pursue genetic testing if it is covered by her insurance.  In addition, she reports the diagnosis of HCM at age 31 in her husband. I recommended that her daughters get routine screening for HCM by Echo and EKG. She understands this.   In addition, we discussed the protections afforded by the Genetic Information Non-Discrimination Act (GINA). I explained to her that GINA protects her from losing her employment or health insurance based on her genotype. However, these protections do not cover  life insurance and disability. She verbalized understanding of this.  Please note that the patient has not been counseled in this visit on personal, cultural or ethical issues that she may face due to her heart condition.   Lattie Corns, Ph.D, [facility] Clinical Molecular  Geneticist

## 2020-10-08 ENCOUNTER — Other Ambulatory Visit: Payer: Self-pay

## 2020-10-08 ENCOUNTER — Ambulatory Visit (INDEPENDENT_AMBULATORY_CARE_PROVIDER_SITE_OTHER): Payer: Medicare Other | Admitting: Podiatry

## 2020-10-08 ENCOUNTER — Encounter: Payer: Self-pay | Admitting: Podiatry

## 2020-10-08 DIAGNOSIS — M7752 Other enthesopathy of left foot: Secondary | ICD-10-CM

## 2020-10-08 DIAGNOSIS — M76822 Posterior tibial tendinitis, left leg: Secondary | ICD-10-CM

## 2020-10-08 NOTE — Progress Notes (Signed)
She presents today for follow-up of capsulitis of the left ankle states that she tried the boot and it helped a little but states that he still throws her back out she is requesting another injection to her sinus tarsi left foot.  Objective: Vital signs are stable she alert oriented x3.  Pulses are palpable.  She has pain on palpation sinus tarsi subtalar joint left.  Assessment: Subtalar joint capsulitis left.  Plan: Injected the area today with dexamethasone Kenalog local anesthetic.  Follow-up with her as needed.

## 2020-10-12 ENCOUNTER — Ambulatory Visit (INDEPENDENT_AMBULATORY_CARE_PROVIDER_SITE_OTHER): Payer: Medicare Other | Admitting: Family Medicine

## 2020-10-12 ENCOUNTER — Other Ambulatory Visit: Payer: Self-pay

## 2020-10-12 ENCOUNTER — Encounter: Payer: Self-pay | Admitting: Family Medicine

## 2020-10-12 VITALS — BP 144/90 | HR 88 | Temp 98.2°F | Ht 64.0 in | Wt 183.6 lb

## 2020-10-12 DIAGNOSIS — M5440 Lumbago with sciatica, unspecified side: Secondary | ICD-10-CM

## 2020-10-12 DIAGNOSIS — R269 Unspecified abnormalities of gait and mobility: Secondary | ICD-10-CM | POA: Diagnosis not present

## 2020-10-12 DIAGNOSIS — M48062 Spinal stenosis, lumbar region with neurogenic claudication: Secondary | ICD-10-CM

## 2020-10-12 DIAGNOSIS — G8929 Other chronic pain: Secondary | ICD-10-CM

## 2020-10-12 DIAGNOSIS — G609 Hereditary and idiopathic neuropathy, unspecified: Secondary | ICD-10-CM

## 2020-10-12 MED ORDER — HYDROCODONE-ACETAMINOPHEN 5-325 MG PO TABS
1.0000 | ORAL_TABLET | Freq: Two times a day (BID) | ORAL | 0 refills | Status: DC | PRN
Start: 2020-10-12 — End: 2020-10-12

## 2020-10-12 MED ORDER — HYDROCODONE-ACETAMINOPHEN 5-325 MG PO TABS
1.0000 | ORAL_TABLET | Freq: Two times a day (BID) | ORAL | 0 refills | Status: DC | PRN
Start: 2020-10-12 — End: 2020-12-14

## 2020-10-12 NOTE — Patient Instructions (Signed)
Please follow up as scheduled for your next visit with me: 11/17/2020   If you have any questions or concerns, please don't hesitate to send me a message via MyChart or call the office at [phone]. Thank you for visiting with Carrie [name] today! It's our pleasure caring for you.

## 2020-10-12 NOTE — Progress Notes (Signed)
Subjective  CC:  Chief Complaint  Patient presents with  . Back Pain    left side    HPI: Carrie [name] is a 74 y.o. female who presents to the office today to address the problems listed above in the chief complaint.  He has moderate lumbar stenosis by symptoms.  Continues to suffer from neurogenic claudication limiting her activity.  Also suffers from gait abnormality.  Her neurosurgeon recently retired she would like to see his partner.  Continues on pain medications and uses it sparingly.  I reviewed her drug database to confirm.  She does need a refill.  No new symptoms.  No bowel or bladder incontinence.  Reviewed neurology assessment.  Recommended work-up for possible early Parkinson's however patient is deferring at this time.  She feels that most of her problems are related to her back. Assessment  1. Spinal stenosis of lumbar region with neurogenic claudication   2. Gait abnormality   3. Chronic bilateral low back pain with sciatica, sciatica laterality unspecified   4. Idiopathic peripheral neuropathy      Plan   Back pain and gait abnormality: Continue current management and follow-up with specialist.  Refer to neurosurgery, Dr. Vertell Limber on patient request.  Refilled pain medication.  Discussed risks versus benefits.  Continue all her other medications without changes today  Follow up: As needed 11/17/2020  Orders Placed This Encounter  Procedures  . Ambulatory referral to Neurosurgery   Meds ordered this encounter  Medications  . DISCONTD: HYDROcodone-acetaminophen (NORCO) 5-325 MG tablet    Sig: Take 1 tablet by mouth 2 (two) times daily as needed for moderate pain.    Dispense:  60 tablet    Refill:  0  . HYDROcodone-acetaminophen (NORCO) 5-325 MG tablet    Sig: Take 1 tablet by mouth 2 (two) times daily as needed for moderate pain.    Dispense:  60 tablet    Refill:  0      I reviewed the patients updated PMH, FH, and SocHx.    Patient Active Problem  List   Diagnosis Date Noted  . Spinal stenosis of lumbar region with neurogenic claudication 04/15/2020    Priority: High  . Chronic bilateral low back pain with sciatica 04/15/2020    Priority: High  . Chronic combined systolic and diastolic CHF (congestive heart failure) ([place]) 03/27/2020    Priority: High  . Gait abnormality 10/14/2019    Priority: High  . Mixed hyperlipidemia 07/31/2019    Priority: High  . History of adenomatous polyp of colon 07/17/2018    Priority: High  . IFG (impaired fasting glucose) 10/21/2016    Priority: High  . Idiopathic peripheral neuropathy 04/08/2016    Priority: High  . Essential hypertension 03/05/2014    Priority: High  . Chronic renal disease, stage 3, moderately decreased glomerular filtration rate (GFR) between 30-59 mL/min/1.73 square meter ([place]) 05/01/2019    Priority: Medium  . Gastro-esophageal reflux disease without esophagitis 10/20/2016    Priority: Medium  . Osteoarthritis of spine with radiculopathy, lumbar region 11/24/2015    Priority: Medium  . Osteopenia 05/28/2014    Priority: Medium  . Irritable bowel syndrome (IBS) 03/27/2013    Priority: Medium  . Former smoker 03/06/2013    Priority: Medium  . Symptomatic PVCs 01/03/2012    Priority: Medium  . Primary osteoarthritis of left foot 07/10/2020    Priority: Low  . Vitamin D deficiency disease 05/01/2019    Priority: Low  . Bladder  prolapse, female, acquired 09/21/2017    Priority: Low  . Seborrheic dermatitis of scalp 03/06/2013    Priority: Low  . Allergic rhinitis 01/03/2012    Priority: Low   Current Meds  Medication Sig  . azelastine (ASTELIN) 0.1 % nasal spray Place 2 sprays into both nostrils 2 (two) times daily. (Patient taking differently: Place 2 sprays into both nostrils 2 (two) times daily. As needed)  . calcium carbonate (TUMS CALCIUM FOR LIFE BONE) 750 MG chewable tablet Chew 750 mg by mouth every evening.   . Cholecalciferol (VITAMIN D3) 2000 units  TABS Take 2,000 Units by mouth every evening.   . Ciclopirox 1 % shampoo Apply 1 application topically every other day.   . docusate sodium (COLACE) 100 MG capsule Take 100 mg by mouth every evening.   . famotidine (PEPCID) 40 MG tablet TAKE 1 TABLET (40 MG TOTAL) BY MOUTH DAILY AS NEEDED FOR HEARTBURN OR INDIGESTION.  Marland Kitchen HYDROcodone-acetaminophen (NORCO) 5-325 MG tablet Take 1 tablet by mouth 2 (two) times daily as needed for moderate pain.  . mometasone (ELOCON) 0.1 % lotion Apply 1 application topically as needed for irritation.  . Multiple Vitamin (MULTIVITAMIN) tablet Take 1 tablet by mouth daily.  . sodium chloride (MURO 128) 2 % ophthalmic solution Place 1 drop into both eyes at bedtime.   . tretinoin (RETIN-A) 0.025 % cream Apply 1 application topically daily as needed (skin bumps).   . Wheat Dextrin (BENEFIBER PO) Take by mouth daily.   . [DISCONTINUED] HYDROcodone-acetaminophen (NORCO) 5-325 MG tablet Take 1 tablet by mouth 2 (two) times daily as needed for moderate pain.  . [DISCONTINUED] HYDROcodone-acetaminophen (NORCO) 5-325 MG tablet Take 1 tablet by mouth 2 (two) times daily as needed for moderate pain.  . [DISCONTINUED] Hyoscyamine Sulfate SL (LEVSIN/SL) 0.125 MG SUBL Place 1 tablet under the tongue every 6 (six) hours as needed.    Allergies: Patient is allergic to phenergan [promethazine hcl], erythromycin, azithromycin, epinephrine, and nitrofurantoin. Family History: Patient family history includes Cancer in her father and sister; Diabetes type II in her sister; High Cholesterol in her mother; Hypertension in her mother, sister, sister, sister, and sister. Social History:  Patient  reports that she quit smoking about 7 years ago. She has never used smokeless tobacco. She reports current alcohol use of about 3.0 - 5.0 standard drinks of alcohol per week. She reports that she does not use drugs.  Review of Systems: Constitutional: Negative for fever malaise or  anorexia Cardiovascular: negative for chest pain Respiratory: negative for SOB or persistent cough Gastrointestinal: negative for abdominal pain  Objective  Vitals: BP (!) 144/90   Pulse 88   Temp 98.2 F (36.8 C) (Temporal)   Ht 5\' 4"  (1.626 m)   Wt 183 lb 9.6 oz (83.3 kg)   SpO2 98%   BMI 31.51 kg/m  General: no acute distress , A&Ox3      Commons side effects, risks, benefits, and alternatives for medications and treatment plan prescribed today were discussed, and the patient expressed understanding of the given instructions. Patient is instructed to call or message via MyChart if he/she has any questions or concerns regarding our treatment plan. No barriers to understanding were identified. We discussed Red Flag symptoms and signs in detail. Patient expressed understanding regarding what to do in case of urgent or emergency type symptoms.   Medication list was reconciled, printed and provided to the patient in AVS. Patient instructions and summary information was reviewed with the patient as documented  in the AVS. This note was prepared with assistance of Dragon voice recognition software. Occasional wrong-word or sound-a-like substitutions may have occurred due to the inherent limitations of voice recognition software  This visit occurred during the SARS-CoV-2 public health emergency.  Safety protocols were in place, including screening questions prior to the visit, additional usage of staff PPE, and extensive cleaning of exam room while observing appropriate contact time as indicated for disinfecting solutions.

## 2020-10-14 ENCOUNTER — Other Ambulatory Visit: Payer: Self-pay | Admitting: Family Medicine

## 2020-10-14 DIAGNOSIS — Z1231 Encounter for screening mammogram for malignant neoplasm of breast: Secondary | ICD-10-CM

## 2020-10-19 ENCOUNTER — Ambulatory Visit: Payer: Medicare Other | Admitting: Family Medicine

## 2020-10-20 DIAGNOSIS — Z23 Encounter for immunization: Secondary | ICD-10-CM | POA: Diagnosis not present

## 2020-10-22 ENCOUNTER — Other Ambulatory Visit: Payer: Self-pay

## 2020-10-22 ENCOUNTER — Ambulatory Visit (INDEPENDENT_AMBULATORY_CARE_PROVIDER_SITE_OTHER): Payer: Medicare Other | Admitting: Family Medicine

## 2020-10-22 ENCOUNTER — Encounter: Payer: Self-pay | Admitting: Family Medicine

## 2020-10-22 VITALS — BP 140/88 | HR 88 | Temp 97.8°F | Ht 64.0 in | Wt 179.2 lb

## 2020-10-22 DIAGNOSIS — I10 Essential (primary) hypertension: Secondary | ICD-10-CM | POA: Diagnosis not present

## 2020-10-22 DIAGNOSIS — Z1231 Encounter for screening mammogram for malignant neoplasm of breast: Secondary | ICD-10-CM

## 2020-10-22 DIAGNOSIS — M48062 Spinal stenosis, lumbar region with neurogenic claudication: Secondary | ICD-10-CM

## 2020-10-22 DIAGNOSIS — G609 Hereditary and idiopathic neuropathy, unspecified: Secondary | ICD-10-CM

## 2020-10-22 DIAGNOSIS — M5440 Lumbago with sciatica, unspecified side: Secondary | ICD-10-CM

## 2020-10-22 DIAGNOSIS — R7301 Impaired fasting glucose: Secondary | ICD-10-CM

## 2020-10-22 DIAGNOSIS — I5042 Chronic combined systolic (congestive) and diastolic (congestive) heart failure: Secondary | ICD-10-CM

## 2020-10-22 DIAGNOSIS — G8929 Other chronic pain: Secondary | ICD-10-CM | POA: Diagnosis not present

## 2020-10-22 DIAGNOSIS — E782 Mixed hyperlipidemia: Secondary | ICD-10-CM | POA: Diagnosis not present

## 2020-10-22 DIAGNOSIS — N1831 Chronic kidney disease, stage 3a: Secondary | ICD-10-CM

## 2020-10-22 NOTE — Patient Instructions (Signed)
Please return in 6 months for hypertension follow up.  I will release your lab results to you on your MyChart account with further instructions. Please reply with any questions.   If you have any questions or concerns, please don't hesitate to send me a message via MyChart or call the office at [phone]. Thank you for visiting with [place] today! It's our pleasure caring for you.

## 2020-10-22 NOTE — Progress Notes (Signed)
Subjective  Chief Complaint  Patient presents with  . Annual Exam    fasting  . Back Pain    scheduled to see neurosurgeon next month    HPI: Carrie [name] is a 74 y.o. female who presents to [facility] at [facility] today for a Female Wellness Visit. She also has the concerns and/or needs as listed above in the chief complaint. These will be addressed in addition to the Health Maintenance Visit.    Wellness Visit: annual visit with health maintenance review and exam without Pap   Health maintenance: Mammogram scheduled.  Bone density due next year.  Other screens are up-to-date.  Immunizations are up-to-date.  Eye exam is up-to-date.  Chronic disease f/u and/or acute problem visit: (deemed necessary to be done in addition to the wellness visit):  Hypertension: No longer on any medications due to history of orthostatic hypotension.  Blood pressures are holding steady.  Chronic heart failure seen at heart failure clinic.  No signs of volume overload at this time.  Chronic back pain: To see neurosurgeon later this month.  This continues to be a major problem for her.  She is getting around okay but activities are limited.  Gait disorder probably multifactorial.  Possible Parkinson's.  Mixed hyperlipidemia no longer on statin.  She was on atorvastatin but stopped due to possible muscle cramps.  She is not sure things have improved off of the medication.  We will recheck today.  She would be open to trying different statin if needed.  History of impaired fasting glucose without symptoms of hyperglycemia.  She does have a sweet tooth but has been avoiding sugars.  Stage III kidney disease due for follow-up.  Assessment  1. Essential hypertension   2. Chronic combined systolic and diastolic CHF (congestive heart failure) ([place])   3. Chronic bilateral low back pain with sciatica, sciatica laterality unspecified   4. Spinal stenosis of lumbar region with neurogenic  claudication   5. Mixed hyperlipidemia   6. IFG (impaired fasting glucose)   7. Idiopathic peripheral neuropathy   8. Stage 3a chronic kidney disease ([place])   9. Visit for screening mammogram      Plan  Female Wellness Visit:  Age appropriate Health Maintenance and Prevention measures were discussed with patient. Included topics are cancer screening recommendations, ways to keep healthy (see AVS) including dietary and exercise recommendations, regular eye and dental care, use of seat belts, and avoidance of moderate alcohol use and tobacco use.   BMI: discussed patient's BMI and encouraged positive lifestyle modifications to help get to or maintain a target BMI.  HM needs and immunizations were addressed and ordered. See below for orders. See HM and immunization section for updates.  Routine labs and screening tests ordered including cmp, cbc and lipids where appropriate.  Discussed recommendations regarding Vit D and calcium supplementation (see AVS)  Chronic disease management visit and/or acute problem visit:  Chronic medical problems are currently fairly well controlled.  She is doing fine off of blood pressure medications.  Check renal function and electrolytes.  Check lipids.  Trial of Crestor if elevated atherosclerotic risk score indicates a need.  Patient is agreeable to this.  Continue to see neurosurgeon for chronic back pain.  Recheck sugars.  She is not treated for peripheral neuropathy and symptoms are currently manageable.  Recommend work-up for Parkinson's and she will discuss again with neurology next month.   Follow up: 6 months for recheck Orders Placed This  Encounter  Procedures  . CBC with Differential/Platelet  . COMPLETE METABOLIC PANEL WITH GFR  . Lipid panel  . TSH  . Hemoglobin A1c   No orders of the defined types were placed in this encounter.     Lifestyle: Body mass index is 30.76 kg/m. Wt Readings from Last 3 Encounters:  10/22/20 179 lb 3.2 oz  (81.3 kg)  10/12/20 183 lb 9.6 oz (83.3 kg)  07/28/20 184 lb (83.5 kg)     Patient Active Problem List   Diagnosis Date Noted  . Spinal stenosis of lumbar region with neurogenic claudication 04/15/2020    Priority: High  . Chronic bilateral low back pain with sciatica 04/15/2020    Priority: High  . Chronic combined systolic and diastolic CHF (congestive heart failure) ([place]) 03/27/2020    Priority: High  . Gait abnormality 10/14/2019    Priority: High  . Mixed hyperlipidemia 07/31/2019    Priority: High    Starting statin aug 2020   . History of adenomatous polyp of colon 07/17/2018    Priority: High  . IFG (impaired fasting glucose) 10/21/2016    Priority: High  . Idiopathic peripheral neuropathy 04/08/2016    Priority: High    Overview:  Evaluated by neuro 06/2015; neg workup; not currently medicated   . Essential hypertension 03/05/2014    Priority: High    Estimated Creatinine Clearance: 50.5 mL/min (by C-G formula based on SCr of 1.02 mg/dL).   . Chronic renal disease, stage 3, moderately decreased glomerular filtration rate (GFR) between 30-59 mL/min/1.73 square meter ([facility]) 05/01/2019    Priority: Medium    Estimated Creatinine Clearance: 48.7 mL/min (A) (by C-G formula based on SCr of 1.09 mg/dL (H)).   . Gastro-esophageal reflux disease without esophagitis 10/20/2016    Priority: Medium  . Osteoarthritis of spine with radiculopathy, lumbar region 11/24/2015    Priority: Medium  . Osteopenia 05/28/2014    Priority: Medium    DEXA 11/2019: osteopenia, T = -1.6 femur, statistically worsening. Recheck 2 years.  DEXA 04/2014 mild, T = - 1.2 lowest/cla   . Irritable bowel syndrome (IBS) 03/27/2013    Priority: Medium    Overview:  Levsin helps with this, prn. Improved with probiotic   . Former smoker 03/06/2013    Priority: Medium     Quit 2014; CXR 2011 (? Early COPD) - asymptomatic.    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 Dr. Thompson Grayer.Symptomatic PVCs, no ischemia   . Primary osteoarthritis of left foot 07/10/2020    Priority: Low    Left foot: cuneiform and medial navicular joint. xrays 06/2020   . Vitamin D deficiency disease 05/01/2019    Priority: Low  . Bladder prolapse, female, acquired 09/21/2017    Priority: Low  . Seborrheic dermatitis of scalp 03/06/2013    Priority: Low    Overview:  Managed by derm   . Allergic rhinitis 01/03/2012    Priority: Low   Health Maintenance  Topic Date Due  . MAMMOGRAM  11/21/2020  . DEXA SCAN  11/21/2021  . TETANUS/TDAP  03/14/2022  . COLONOSCOPY  06/24/2025  . INFLUENZA VACCINE  Completed  . COVID-19 Vaccine  Completed  . Hepatitis C Screening  Completed  . PNA vac Low Risk Adult  Completed   Immunization History  Administered Date(s) Administered  . Fluad Quad(high Dose 65+) 09/05/2016, 09/15/2020  . Influenza, High Dose Seasonal PF 09/28/2015, 09/21/2017, 09/22/2018  . Influenza,inj,quad,  With Preservative 09/04/2017  . Moderna SARS-COVID-2 Vaccination 12/17/2019, 01/15/2020, 10/20/2020  . Pneumococcal Conjugate-13 02/21/2015  . Pneumococcal Polysaccharide-23 03/02/2012, 07/31/2019  . Tdap 03/14/2012  . Zoster 01/16/2012  . Zoster Recombinat (Shingrix) 10/18/2019, 11/04/2019   We updated and reviewed the patient's past history in detail and it is documented below. Allergies: Patient is allergic to phenergan [promethazine hcl], erythromycin, azithromycin, epinephrine, and nitrofurantoin. Past Medical History Patient  has a past medical history of BPPV (benign paroxysmal positional vertigo), bilateral (04/11/2019), CHF (congestive heart failure) ([place]), Colon polyps, Esophageal spasm, Fatigue, Frequent PVCs, GERD (gastroesophageal reflux disease), IBS (irritable bowel syndrome), Lumbar stenosis, LV non-compaction cardiomyopathy ([place]), Neurogenic claudication ([place]), Palpitations, Recurrent UTI, Seborrheic dermatitis of scalp  (03/06/13), and Vitamin B 12 deficiency. Past Surgical History Patient  has a past surgical history that includes Colectomy (Right, 1998); Rectocele repair (2001); Spine surgery; Endoscopic vein laser treatment (Bilateral, 2010-2011); Cholecystectomy (N/A, 08/23/2016); Knee arthroscopy w/ meniscectomy (Left); and RIGHT/LEFT HEART CATH AND CORONARY ANGIOGRAPHY (N/A, 03/18/2020). Family History: Patient family history includes Cancer in her father and sister; Diabetes type II in her sister; High Cholesterol in her mother; Hypertension in her mother, sister, sister, sister, and sister. Social History:  Patient  reports that she quit smoking about 7 years ago. She has never used smokeless tobacco. She reports current alcohol use of about 3.0 - 5.0 standard drinks of alcohol per week. She reports that she does not use drugs.  Review of Systems: Constitutional: negative for fever or malaise Ophthalmic: negative for photophobia, double vision or loss of vision Cardiovascular: negative for chest pain, dyspnea on exertion, or new LE swelling Respiratory: negative for SOB or persistent cough Gastrointestinal: negative for abdominal pain, change in bowel habits or melena Genitourinary: negative for dysuria or gross hematuria, no abnormal uterine bleeding or disharge Musculoskeletal: negative for new gait disturbance or muscular weakness Integumentary: negative for new or persistent rashes, no breast lumps Neurological: negative for TIA or stroke symptoms Psychiatric: negative for SI or delusions Allergic/Immunologic: negative for hives  Patient Care Team    Relationship Specialty Notifications Start End  Leamon Arnt, MD PCP - General Family Medicine  08/22/19   Melvenia Beam, MD Consulting Physician Neurology  11/22/19   Thompson Grayer, MD Consulting Physician Cardiology  11/22/19   Jovita Gamma, MD Consulting Physician Neurosurgery  11/22/19   Richmond Campbell, MD Consulting Physician  Gastroenterology  11/22/19   Jarome Matin, MD Consulting Physician Dermatology  11/22/19     Objective  Vitals: BP 140/88   Pulse 88   Temp 97.8 F (36.6 C) (Temporal)   Ht 5\' 4"  (1.626 m)   Wt 179 lb 3.2 oz (81.3 kg)   SpO2 96%   BMI 30.76 kg/m  General:  Well developed, well nourished, no acute distress  Psych:  Alert and orientedx3,normal mood and flat affect HEENT:  Normocephalic, atraumatic, non-icteric sclera,  supple neck without adenopathy, mass or thyromegaly Cardiovascular:  Normal S1, S2, RRR without gallop, rub or murmur Respiratory:  Good breath sounds bilaterally, CTAB with normal respiratory effort Gastrointestinal: normal bowel sounds, soft, non-tender, no noted masses. No HSM Extremities wearing compression stockings   Commons side effects, risks, benefits, and alternatives for medications and treatment plan prescribed today were discussed, and the patient expressed understanding of the given instructions. Patient is instructed to call or message via MyChart if he/she has any questions or concerns regarding our treatment plan. No barriers to understanding were identified. We discussed Red Flag symptoms and signs in  detail. Patient expressed understanding regarding what to do in case of urgent or emergency type symptoms.   Medication list was reconciled, printed and provided to the patient in AVS. Patient instructions and summary information was reviewed with the patient as documented in the AVS. This note was prepared with assistance of Dragon voice recognition software. Occasional wrong-word or sound-a-like substitutions may have occurred due to the inherent limitations of voice recognition software  This visit occurred during the SARS-CoV-2 public health emergency.  Safety protocols were in place, including screening questions prior to the visit, additional usage of staff PPE, and extensive cleaning of exam room while observing appropriate contact time as indicated for  disinfecting solutions.

## 2020-10-23 LAB — CBC WITH DIFFERENTIAL/PLATELET
Absolute Monocytes: 619 cells/uL (ref 200–950)
Basophils Absolute: 30 cells/uL (ref 0–200)
Basophils Relative: 0.7 %
Eosinophils Absolute: 103 cells/uL (ref 15–500)
Eosinophils Relative: 2.4 %
HCT: 37.9 % (ref 35.0–45.0)
Hemoglobin: 12.7 g/dL (ref 11.7–15.5)
Lymphs Abs: 1023 cells/uL (ref 850–3900)
MCH: 30.5 pg (ref 27.0–33.0)
MCHC: 33.5 g/dL (ref 32.0–36.0)
MCV: 90.9 fL (ref 80.0–100.0)
MPV: 10.5 fL (ref 7.5–12.5)
Monocytes Relative: 14.4 %
Neutro Abs: 2524 cells/uL (ref 1500–7800)
Neutrophils Relative %: 58.7 %
Platelets: 187 10*3/uL (ref 140–400)
RBC: 4.17 10*6/uL (ref 3.80–5.10)
RDW: 11.9 % (ref 11.0–15.0)
Total Lymphocyte: 23.8 %
WBC: 4.3 10*3/uL (ref 3.8–10.8)

## 2020-10-23 LAB — COMPLETE METABOLIC PANEL WITH GFR
AG Ratio: 2 (calc) (ref 1.0–2.5)
ALT: 18 U/L (ref 6–29)
AST: 20 U/L (ref 10–35)
Albumin: 4.3 g/dL (ref 3.6–5.1)
Alkaline phosphatase (APISO): 62 U/L (ref 37–153)
BUN: 17 mg/dL (ref 7–25)
CO2: 29 mmol/L (ref 20–32)
Calcium: 9.7 mg/dL (ref 8.6–10.4)
Chloride: 103 mmol/L (ref 98–110)
Creat: 0.92 mg/dL (ref 0.60–0.93)
GFR, Est African American: 71 mL/min/{1.73_m2} (ref >=60)
GFR, Est Non African American: 61 mL/min/{1.73_m2} (ref >=60)
Globulin: 2.1 g/dL (calc) (ref 1.9–3.7)
Glucose, Bld: 93 mg/dL (ref 65–99)
Potassium: 4.6 mmol/L (ref 3.5–5.3)
Sodium: 141 mmol/L (ref 135–146)
Total Bilirubin: 0.5 mg/dL (ref 0.2–1.2)
Total Protein: 6.4 g/dL (ref 6.1–8.1)

## 2020-10-23 LAB — LIPID PANEL
Cholesterol: 190 mg/dL (ref <200)
HDL: 81 mg/dL (ref >=50)
LDL Cholesterol (Calc): 92 mg/dL (calc)
Non-HDL Cholesterol (Calc): 109 mg/dL (calc) (ref <130)
Total CHOL/HDL Ratio: 2.3 (calc) (ref <5.0)
Triglycerides: 83 mg/dL (ref <150)

## 2020-10-23 LAB — HEMOGLOBIN A1C
Hgb A1c MFr Bld: 5.8 % of total Hgb — ABNORMAL HIGH (ref <5.7)
Mean Plasma Glucose: 120 (calc)
eAG (mmol/L): 6.6 (calc)

## 2020-10-23 LAB — TSH: TSH: 1.1 mIU/L (ref 0.40–4.50)

## 2020-10-27 ENCOUNTER — Encounter: Payer: Self-pay | Admitting: Family Medicine

## 2020-10-31 NOTE — Progress Notes (Signed)
ADVANCED HF CLINIC NOTE  Referring Physician: Nahser Primary Care: Dr. Billey Chang Primary Cardiologist: Nahser  HPI:  Carrie [name] is a 74 y/o woman with GERD, IBS and PVCs referred by Dr. 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   She has been followed by Dr. Acie Fredrickson and Dr. Rayann Heman for PVCs and dyspnea. Has been on Norpace. Developed increasing weakness and orthostasis and concern raised for TTR amyloid.Marland KitchencMRI ordered in 4/21 and showed EF 42% with LVNC. (no amyloid).  4./19 EF 50% Grade I DD 4/19 Lexiscan Myoview EF 48% question old apical infarct 2/20 EF 45-50% Grade I DD 3/21 EF 30-35% 3/21 PYP equivocal 4/21 cMRI EF 42% no LGE. + LVNC  Cath 4/21  EF 45-50% No CAD RA = 4 PA = 38/11 (23) PCWP = 9 Fick 6.6/3.5  She is here with her husband. In Jan 2020 had L knee meniscus surgery c/b tibial fracture so was in bed for some time. Activity level has not bounced back since that time. Now struggling with spinal stenosis. Doing Nu-Step 3x/week at [facility] x 30 min and also doing Pilates. Mild edema in left foot which she attributes in part to arthritis. No orthopnea or PND. Following BP closely typically 110/70. (was previously low). Neurology working up for possible Parkinson's +/- Shy-Drager   Past Medical History:  Diagnosis Date  . BPPV (benign paroxysmal positional vertigo), bilateral 04/11/2019  . CHF (congestive heart failure) ([place])   . Colon polyps   . Esophageal spasm   . Fatigue   . Frequent PVCs   . GERD (gastroesophageal reflux disease)   . IBS (irritable bowel syndrome)   . Lumbar stenosis    "around L4,5, somewhere in there"  . LV non-compaction cardiomyopathy [facility])    pt states she was told this is congenital  . Neurogenic claudication ([place])   . Palpitations   . Recurrent UTI   . Seborrheic dermatitis of scalp 03/06/13  . Vitamin B 12 deficiency     Current Outpatient Medications  Medication Sig Dispense Refill  . azelastine  (ASTELIN) 0.1 % nasal spray Place 2 sprays into both nostrils 2 (two) times daily. (Patient taking differently: Place 2 sprays into both nostrils 2 (two) times daily. As needed) 30 mL 12  . calcium carbonate (TUMS CALCIUM FOR LIFE BONE) 750 MG chewable tablet Chew 750 mg by mouth every evening.     . Cholecalciferol (VITAMIN D3) 2000 units TABS Take 2,000 Units by mouth every evening.     . Ciclopirox 1 % shampoo Apply 1 application topically every other day.     . docusate sodium (COLACE) 100 MG capsule Take 100 mg by mouth every evening.     . famotidine (PEPCID) 40 MG tablet TAKE 1 TABLET (40 MG TOTAL) BY MOUTH DAILY AS NEEDED FOR HEARTBURN OR INDIGESTION. 90 tablet 3  . HYDROcodone-acetaminophen (NORCO) 5-325 MG tablet Take 1 tablet by mouth 2 (two) times daily as needed for moderate pain. 60 tablet 0  . ibuprofen (ADVIL) 200 MG tablet Take 200 mg by mouth daily as needed. 2 tabs    . mometasone (ELOCON) 0.1 % lotion Apply 1 application topically as needed for irritation.    . Multiple Vitamin (MULTIVITAMIN) tablet Take 1 tablet by mouth daily.    . sodium chloride (MURO 128) 2 % ophthalmic solution Place 1 drop into both eyes at bedtime.     . tretinoin (RETIN-A) 0.025 % cream Apply 1 application topically daily as needed (skin bumps).  0   No current facility-administered medications for this encounter.    Allergies  Allergen Reactions  . Phenergan [Promethazine Hcl] Shortness Of Breath and Other (See Comments)    Pt states she got to where she could not breath when she got iv phenergan after surgery  . Erythromycin Diarrhea and Nausea And Vomiting  . Azithromycin Other (See Comments)    Including macrobid  (weakness)  . Epinephrine Other (See Comments)    Heart arrythmias  . Nitrofurantoin Nausea Only and Other (See Comments)    Weakness   Vitals:   11/04/20 1105  BP: (!) 160/90  Pulse: 84  SpO2: 99%  Weight: 82.7 kg (182 lb 6.4 oz)    PHYSICAL EXAM: General:  Well  appearing but bradykinetic No resp difficulty HEENT: normal Neck: supple. no JVD. Carotids 2+ bilat; no bruits. No lymphadenopathy or thryomegaly appreciated. Cor: PMI nondisplaced. Regular rate & rhythm. No rubs, gallops or murmurs. Lungs: clear Abdomen: obese oft, nontender, nondistended. No hepatosplenomegaly. No bruits or masses. Good bowel sounds. Extremities: no cyanosis, clubbing, rash, edema Neuro: alert & orientedx3, cranial nerves grossly intact. moves all 4 extremities w/o difficulty. Affect pleasant   ECG: NSR 90. IVCD No PVCs Personally reviewed   ASSESSMENT & PLAN:  1. Mild LV systolic dysfunction/HF due to LVNC - NYHA III but doubt exercise intolerance due primarily to HF> Agree that Parkinsonianism may be an issue. Await results of work-up. - Volume status looks good.  - cMRI 4/21 EF 42% No LGE. + LVNC - cath 4/21 normal cors and normal hemodynamics - EF has been stable over many years so suspect she may have an indolent phenotype - GDMT has been limited by low/labile BP - Off losartan and Toprorl due to low BP. Will re-attempt losartan 12.5mg  qhs - Has seen Dr. Broadus John for genetic testing in 10/21 and felt to have isolated event due to lack of FHx. Genetic testing sent. Results pending. - Discussed need for more activity and weight loss as able - Follow with yearly echos - We discussed possibility of CPX testing to further assess primary cause of exercise intolerance. Will await results of Parkinson's testing  2. Frequent PVCs - has been maintained on Norpace - followed by Dr. Rayann Heman - Zio 6/21 Sinus. Rare PACs and PVCs. - Nochange today   Glori Bickers, MD  11:31 AM

## 2020-11-04 ENCOUNTER — Encounter (HOSPITAL_COMMUNITY): Payer: Self-pay | Admitting: Internal Medicine

## 2020-11-04 ENCOUNTER — Ambulatory Visit (HOSPITAL_COMMUNITY)
Admission: RE | Admit: 2020-11-04 | Discharge: 2020-11-04 | Disposition: A | Discharge status: Home / Self Care | Payer: Medicare Other | Source: Ambulatory Visit | Attending: Internal Medicine | Admitting: Internal Medicine

## 2020-11-04 ENCOUNTER — Other Ambulatory Visit: Payer: Self-pay

## 2020-11-04 VITALS — BP 160/90 | HR 84 | Wt 182.4 lb

## 2020-11-04 DIAGNOSIS — Z888 Allergy status to other drugs, medicaments and biological substances status: Secondary | ICD-10-CM | POA: Diagnosis not present

## 2020-11-04 DIAGNOSIS — I429 Cardiomyopathy, unspecified: Secondary | ICD-10-CM | POA: Diagnosis not present

## 2020-11-04 DIAGNOSIS — Z7951 Long term (current) use of inhaled steroids: Secondary | ICD-10-CM | POA: Diagnosis not present

## 2020-11-04 DIAGNOSIS — R0600 Dyspnea, unspecified: Secondary | ICD-10-CM | POA: Insufficient documentation

## 2020-11-04 DIAGNOSIS — I428 Other cardiomyopathies: Secondary | ICD-10-CM | POA: Diagnosis not present

## 2020-11-04 DIAGNOSIS — I5022 Chronic systolic (congestive) heart failure: Secondary | ICD-10-CM | POA: Diagnosis not present

## 2020-11-04 DIAGNOSIS — I493 Ventricular premature depolarization: Secondary | ICD-10-CM | POA: Insufficient documentation

## 2020-11-04 DIAGNOSIS — R531 Weakness: Secondary | ICD-10-CM | POA: Diagnosis not present

## 2020-11-04 DIAGNOSIS — Z79899 Other long term (current) drug therapy: Secondary | ICD-10-CM | POA: Insufficient documentation

## 2020-11-04 DIAGNOSIS — K219 Gastro-esophageal reflux disease without esophagitis: Secondary | ICD-10-CM | POA: Insufficient documentation

## 2020-11-04 DIAGNOSIS — Z86010 Personal history of colonic polyps: Secondary | ICD-10-CM | POA: Diagnosis not present

## 2020-11-04 DIAGNOSIS — Z881 Allergy status to other antibiotic agents status: Secondary | ICD-10-CM | POA: Insufficient documentation

## 2020-11-04 DIAGNOSIS — K589 Irritable bowel syndrome without diarrhea: Secondary | ICD-10-CM | POA: Diagnosis not present

## 2020-11-04 MED ORDER — LOSARTAN POTASSIUM 25 MG PO TABS: 12.5000 mg | ORAL_TABLET | Freq: Every day | ORAL | 4 refills | Status: DC

## 2020-11-04 NOTE — Patient Instructions (Addendum)
START Losartan 12.5mg  (1/2 tablet) every evening  Please call our office in June 2022 to schedule your follow up appointment  If you have any questions or concerns before your next appointment please send [place] a message through [place] or call our office at [phone].    TO LEAVE A MESSAGE FOR THE NURSE SELECT OPTION 2, PLEASE LEAVE A MESSAGE INCLUDING: . YOUR NAME . DATE OF BIRTH . CALL BACK NUMBER . REASON FOR CALL**this is important as we prioritize the call backs  YOU WILL RECEIVE A CALL BACK THE SAME DAY AS LONG AS YOU CALL BEFORE 4:00 PM

## 2020-11-10 NOTE — Progress Notes (Deleted)
[facility]    Provider:  Dr Jaynee Eagles Requesting Provider: Scarlette Calico, MD Primary Care Provider:  Leamon Arnt, MD  CC:  Gait abnormality  Interval history July 28, 2020: Here for follow up on  Lumbar spinal stenosis with neurogenic claudication, exam with parkinsonism I have suggested further work-up in the past.  Lumbar spine showed lumbosacral stenosis but patient declined surgery and just wanted to be treated with pain management, I recommended a DaTscan in the past patient has declined she is here for follow-up today.  Gait abnormality likely multifactorial including multiple joint osteoarthritis, lumbar osteoarthritis with radiculopathy, peripheral neuropathy which she was evaluated in the past and likely prediabetic or diabetic. She feels like she is falling apart quickly. She used to walk 7 miles. She has orthostatic hypotettion and sometimes drops to 70 systolic. She has an essential tremor. We discussed getting an MRI of the cervical spine. She is exercising 3x a week. She declines mri c-spine because she wouldn't want surgery anyway.     04/15/2020: She has back pain, she has stenosis and neurogenic claudication, her left leg is weak and when she stands it is painful and if she walk a long time she gets weak and pain and has to sit down. She has been exercising and going to PT, doing everything she can do, pool walking. She had injections I octoer and December. She has tried tylenol, ibuprofen contraindicated, in the past opioids have helped prn, she has tried lyrica and gabapentin, decadron, diclofenac topical, lidocaine patches, we discussed cymbalta but she does not want to take anything regularly prn, robaxin, mobic,  she just wants something as needed and would prefer prn opioids.Discussed the solution would be decompression surgery.  HPI:  CIELLA OBI is a 74 y.o. female here as requested by Leamon Arnt, MD for progressive ataxia.  Past medical history  hypertension, hyperlipidemia, former smoker, irritable bowel syndrome, hypoestrogen, B12 deficiency, benign positional vertigo, impaired fasting glucose, peripheral neuropathy, osteoarthritis of the spine with radiculopathy in the lumbar region, chronic renal disease stage III, vitamin D deficiency.In the past year reported worsening dizziness and vertigo, vertigo worsened when she lays down in bed and turns her head to the right, also trouble walking but she thinks is due to chronic knee pain, vertigo is treated with meclizine, chronic numbness in both feet that was previously evaluated by neurologist, also a tremor in her left thumb over the last year.  I did see patient in 2016 for symptoms in her right foot thought it was due to sciatica, tingling in the balls of her feet and some in her toes, her A1c at the time was 6.2-6.4 thought to be due to impaired glucose.  Extensive lab testing was otherwise unremarkable.  She had knee surgery and numerous problems, she was in bed for 7-8 weeks. When she got up she was weak. She was also having dizziness at the same time. He recommended she see [place] but she thought it was her knee and disuse. She had a +epley and dix-hallpike she sleeps on her left side not on her right side because it makes her dizzy. She still has neuropathy in the feet. Her feet started turning red. She feels she has improved, she is not using a cane, she had surgery in January in the knee and she has stenosis in her back. She can't bike anymore, she is afraid she will fall, she bikes in the house. Over the last improved since surgery but  still overall decline in mobility, no falls, no difficulty swallowing, she has dizziness which has been diagnosed as BPPV. No weakness. She feels slower. No other focal neurologic deficits, associated symptoms, inciting events or modifiable factors.  Reviewed notes, labs and imaging from outside physicians, which showed:  Reviewed MRI brain 05/2019 and agree with  the following: White matter hyperintensities most commonly indicating chronic ischemic microangiopathy. No acute or reversible intracranial abnormality.  I reviewed Dr. Ronnald Ramp notes.  In the past she is complained of worsening dizziness and vertigo, vertigo worsened when she lays down in bed and turns her head to the right, also trouble walking but she thinks is due to chronic knee pain, vertigo is treated with meclizine, chronic numbness in both feet that was previously evaluated by neurologist, also a tremor in her left thumb over the last year.  I did see patient in 2016 for symptoms in her right foot thought it was due to sciatica, tingling in the balls of her feet and some in her toes, her A1c at the time was 6.2-6.4 thought to be due to impaired glucose.  Extensive lab testing was otherwise unremarkable.  I reviewed labs which included normal TSH, and in 2016 extensive labs for neuropathy all unremarkable.  Review of Systems: Patient complains of symptoms per HPI as well as the following symptoms: gait problems, chronic back and neck pain. Pertinent negatives and positives per HPI. All others negative   Social History   Socioeconomic History  . Marital status: Married    Spouse name: Not on file  . Number of children: 2  . Years of education: 66  . Highest education level: Not on file  Occupational History  . Occupation: Retired Therapist, sports  Tobacco Use  . Smoking status: Former Smoker    Quit date: 02/02/2013    Years since quitting: 7.7  . Smokeless tobacco: Never Used  Vaping Use  . Vaping Use: Never used  Substance and Sexual Activity  . Alcohol use: Yes    Alcohol/week: 3.0 - 5.0 standard drinks    Types: 3 - 5 Glasses of wine per week  . Drug use: No  . Sexual activity: Yes  Other Topics Concern  . Not on file  Social History Narrative   Lives in [place] with spouse at [facility]    Previously Volunteered as a Contractor   Caffeine use: none,  quit at least 30 years ago   Social Determinants of Radio broadcast assistant Strain:   . Difficulty of Paying Living Expenses: Not on file  Food Insecurity:   . Worried About Charity fundraiser in the Last Year: Not on file  . Ran Out of Food in the Last Year: Not on file  Transportation Needs:   . Lack of Transportation (Medical): Not on file  . Lack of Transportation (Non-Medical): Not on file  Physical Activity:   . Days of Exercise per Week: Not on file  . Minutes of Exercise per Session: Not on file  Stress:   . Feeling of Stress : Not on file  Social Connections:   . Frequency of Communication with Friends and Family: Not on file  . Frequency of Social Gatherings with Friends and Family: Not on file  . Attends Religious Services: Not on file  . Active Member of Clubs or Organizations: Not on file  . Attends Archivist Meetings: Not on file  . Marital Status: Not on file  Intimate Partner Violence:   .  Fear of Current or Ex-Partner: Not on file  . Emotionally Abused: Not on file  . Physically Abused: Not on file  . Sexually Abused: Not on file    Family History  Problem Relation Age of Onset  . Hypertension Mother   . High Cholesterol Mother   . Cancer Father        head and neck  . Hypertension Sister   . Hypertension Sister   . Diabetes type II Sister        was on steroids   . Hypertension Sister   . Hypertension Sister   . Cancer Sister        bladder  . Early death Neg Hx   . Diabetes Neg Hx   . Heart disease Neg Hx   . Kidney disease Neg Hx   . Stroke Neg Hx   . Breast cancer Neg Hx   . Parkinson's disease Neg Hx     Past Medical History:  Diagnosis Date  . BPPV (benign paroxysmal positional vertigo), bilateral 04/11/2019  . CHF (congestive heart failure) ([place])   . Colon polyps   . Esophageal spasm   . Fatigue   . Frequent PVCs   . GERD (gastroesophageal reflux disease)   . IBS (irritable bowel syndrome)   . Lumbar stenosis     "around L4,5, somewhere in there"  . LV non-compaction cardiomyopathy [facility])    pt states she was told this is congenital  . Neurogenic claudication ([place])   . Palpitations   . Recurrent UTI   . Seborrheic dermatitis of scalp 03/06/13  . Vitamin B 12 deficiency     Patient Active Problem List   Diagnosis Date Noted  . Primary osteoarthritis of left foot 07/10/2020  . Spinal stenosis of lumbar region with neurogenic claudication 04/15/2020  . Chronic bilateral low back pain with sciatica 04/15/2020  . Chronic combined systolic and diastolic CHF (congestive heart failure) ([place]) 03/27/2020  . Gait abnormality 10/14/2019  . Mixed hyperlipidemia 07/31/2019  . Vitamin D deficiency disease 05/01/2019  . Chronic renal disease, stage 3, moderately decreased glomerular filtration rate (GFR) between 30-59 mL/min/1.73 square meter ([place]) 05/01/2019  . History of adenomatous polyp of colon 07/17/2018  . Bladder prolapse, female, acquired 09/21/2017  . IFG (impaired fasting glucose) 10/21/2016  . Gastro-esophageal reflux disease without esophagitis 10/20/2016  . Idiopathic peripheral neuropathy 04/08/2016  . Osteoarthritis of spine with radiculopathy, lumbar region 11/24/2015  . Osteopenia 05/28/2014  . Essential hypertension 03/05/2014  . Irritable bowel syndrome (IBS) 03/27/2013  . Former smoker 03/06/2013  . Seborrheic dermatitis of scalp 03/06/2013  . Allergic rhinitis 01/03/2012  . Symptomatic PVCs 01/03/2012    Past Surgical History:  Procedure Laterality Date  . CHOLECYSTECTOMY N/A 08/23/2016   Procedure: LAPAROSCOPIC CHOLECYSTECTOMY;  Surgeon: Rolm Bookbinder, MD;  Location: [facility];  Service: General;  Laterality: N/A;  . COLECTOMY Right 1998   large colonic polyp  . ENDOSCOPIC VEIN LASER TREATMENT Bilateral 2010-2011  . KNEE ARTHROSCOPY W/ MENISCECTOMY Left   . [place]  2001  . RIGHT/LEFT HEART CATH AND CORONARY ANGIOGRAPHY N/A 03/18/2020   Procedure: RIGHT/LEFT HEART CATH  AND CORONARY ANGIOGRAPHY;  Surgeon: Jettie Booze, MD;  Location: [place] [facility];  Service: Cardiovascular;  Laterality: N/A;  . SPINE SURGERY     3 since 11/2010, laminectomy, May 2012 lumbar fusion, diskectomy    Current Outpatient Medications  Medication Sig Dispense Refill  . azelastine (ASTELIN) 0.1 % nasal spray Place 2 sprays into  both nostrils 2 (two) times daily. (Patient taking differently: Place 2 sprays into both nostrils 2 (two) times daily. As needed) 30 mL 12  . calcium carbonate (TUMS CALCIUM FOR LIFE BONE) 750 MG chewable tablet Chew 750 mg by mouth every evening.     . Cholecalciferol (VITAMIN D3) 2000 units TABS Take 2,000 Units by mouth every evening.     . Ciclopirox 1 % shampoo Apply 1 application topically every other day.     . docusate sodium (COLACE) 100 MG capsule Take 100 mg by mouth every evening.     . famotidine (PEPCID) 40 MG tablet TAKE 1 TABLET (40 MG TOTAL) BY MOUTH DAILY AS NEEDED FOR HEARTBURN OR INDIGESTION. 90 tablet 3  . HYDROcodone-acetaminophen (NORCO) 5-325 MG tablet Take 1 tablet by mouth 2 (two) times daily as needed for moderate pain. 60 tablet 0  . ibuprofen (ADVIL) 200 MG tablet Take 200 mg by mouth daily as needed. 2 tabs    . losartan (COZAAR) 25 MG tablet Take 0.5 tablets (12.5 mg total) by mouth daily. 15 tablet 4  . mometasone (ELOCON) 0.1 % lotion Apply 1 application topically as needed for irritation.    . Multiple Vitamin (MULTIVITAMIN) tablet Take 1 tablet by mouth daily.    . sodium chloride (MURO 128) 2 % ophthalmic solution Place 1 drop into both eyes at bedtime.     . tretinoin (RETIN-A) 0.025 % cream Apply 1 application topically daily as needed (skin bumps).   0   No current facility-administered medications for this visit.    Allergies as of 11/11/2020 - Review Complete 11/04/2020  Allergen Reaction Noted  . Phenergan [promethazine hcl] Shortness Of Breath and Other (See Comments) 08/22/2016  . Erythromycin Diarrhea  and Nausea And Vomiting 12/25/2013  . Azithromycin Other (See Comments) 12/25/2013  . Epinephrine Other (See Comments) 12/25/2013  . Nitrofurantoin Nausea Only and Other (See Comments) 12/25/2013    Vitals: There were no vitals taken for this visit. Last Weight:  Wt Readings from Last 1 Encounters:  11/04/20 182 lb 6.4 oz (82.7 kg)   Last Height:   Ht Readings from Last 1 Encounters:  10/22/20 5\' 4"  (1.626 m)   Exam Stable  Physical exam: Exam: Gen: NAD, conversant, well nourised, obese, well groomed                     Eyes: Conjunctivae clear without exudates or hemorrhage  Neuro: Repeated, Stable Detailed Neurologic Exam  Speech:    Speech is normal; fluent and spontaneous with normal comprehension.  Cognition:    The patient is oriented to person, place, and time;     recent and remote memory intact;     language fluent;     normal attention, concentration,     fund of knowledge Cranial Nerves: hypomimia.    The pupils are equal, round, and reactive to light.  Saccadic pursuit. Pupils too small to visual fundi. Visual fields are full to finger confrontation. Extraocular movements are intact. Trigeminal sensation is intact and the muscles of mastication are normal. Slight ptosis on the left otherwise the face is symmetric. The palate elevates in the midline. Hearing intact. Voice is normal. Shoulder shrug is normal. The tongue has normal motion without fasciculations.   Coordination:    No dysmetria, bradykinesia  Gait:    Slightly decreased arm swing in the right arm as compared to the left  Motor Observation:    Postural tremor on the right Tone:  Normal muscle tone.  No cogwheeling  Posture:    Slightly bent at the waist    Strength:    Strength is V/V in the upper and lower limbs.      Sensation: intact to LT, proprioception intact in the great toes.      Reflex Exam:  DTR's:    Deep tendon reflexes in the upper and lower extremities are trace AJs,  1+ patellars, 2+ biceps bilaterally.   Toes:    The toes are equivocal bilaterally.   Clonus:    Clonus is absent.    Assessment/Plan:   74 y.o. female here as requested by Leamon Arnt, MD for gait abnormality and low back pain with stenosis and neurogenic claudication.  Past medical history hypertension, hyperlipidemia, former smoker, irritable bowel syndrome, hypoestrogen, B12 deficiency, benign positional vertigo, impaired fasting glucose, peripheral neuropathy, osteoarthritis of the spine with radiculopathy in the lumbar region, chronic renal disease stage III, vitamin D deficiency.  - She has parkinsonism I suspect PD, today we had a long talk about Parkinson's disease and parkinsonism, I highly recommended a DaTscan, if positive we can send her to [facility] to discuss DBS, I am not sure patient would tolerate any of the Parkinson's medications we currently have due to her orthostatic hypotension I may recommend DBS for patient we would send to [facility] for evaluation.  We watched the videos online together of Parkinson's disease and DBS.  - Lumbar spinal stenosis with neurogenic claudication: the best solution is decompressive surgery.   She has been exercising and going to PT, doing everything she can do, pool walking. She had injections in October and December. She has tried tylenol, ibuprofen contraindicated, in the past opioids have helped prn, she has tried lyrica and gabapentin, decadron, diclofenac topical, lidocaine patches, we discussed cymbalta but she does not want to take anything regularly, robaxin, mobic,  she just wants something as needed and would prefer prn opioids. Will refer to pain clinic.  - Discussed MRI cervical spine, she declined, but she agreed to a DAT Scan to differentiate bewteen parkinson;s disease verses essential tremor or gait apraxia (no significant white matter changes on MRI brain however)  - otherwise Gait abnormality likely multifactorial including  osteoarthritis in the knees and other multiple joints, lumbar osteoarthritis with radiculopathy, peripheral neuropathy for which she was evaluated in the past and likely pre-diabetic/diabetic nothing else found  - BPPV: vestibular therapy ordered in the past  No orders of the defined types were placed in this encounter.   Cc: Leamon Arnt, MD,   Scarlette Calico, MD  Sarina Ill, MD  [facility] [address] [place], [place] [address]  Phone [phone] Fax [phone]  I spent more than 40 minutes of face-to-face and non-face-to-face time with patient on the  No diagnosis found. diagnosis.  This included previsit chart review, lab review, study review, order entry, electronic health record documentation, patient education on the different diagnostic and therapeutic options, counseling and coordination of care, risks and benefits of management, compliance, or risk factor reduction

## 2020-11-11 ENCOUNTER — Telehealth: Payer: Self-pay | Admitting: Neurology

## 2020-11-11 ENCOUNTER — Ambulatory Visit: Payer: Self-pay | Admitting: Neurology

## 2020-11-11 ENCOUNTER — Other Ambulatory Visit: Payer: Self-pay | Admitting: Cardiovascular Disease

## 2020-11-11 NOTE — Telephone Encounter (Signed)
I spoke to patient, she is seeing Dr Vertell Limber for evaluation of surgical decompression for lumbar stenosis. We decided to Postpone DAT Scan until patient has lumbar decompression and we can re-evaluate because her susymptoms may be due to her severe lumbar stenosis. We will postpone follow up until after she sees Dr. Vertell Limber and has rgery (CC Dr. Haroldine Laws).   Carrie [name] please remove from schedule I will follow along remotely as she goes through the process with Dr Vertell Limber.

## 2020-11-11 NOTE — Telephone Encounter (Signed)
Noted, appt canceled.

## 2020-11-12 ENCOUNTER — Encounter: Payer: Self-pay | Admitting: Podiatry

## 2020-11-12 ENCOUNTER — Other Ambulatory Visit: Payer: Self-pay

## 2020-11-12 ENCOUNTER — Ambulatory Visit (INDEPENDENT_AMBULATORY_CARE_PROVIDER_SITE_OTHER): Payer: Medicare Other | Admitting: Podiatry

## 2020-11-12 DIAGNOSIS — M7752 Other enthesopathy of left foot: Secondary | ICD-10-CM | POA: Diagnosis not present

## 2020-11-12 DIAGNOSIS — M76822 Posterior tibial tendinitis, left leg: Secondary | ICD-10-CM | POA: Diagnosis not present

## 2020-11-12 DIAGNOSIS — M66872 Spontaneous rupture of other tendons, left ankle and foot: Secondary | ICD-10-CM

## 2020-11-12 MED ORDER — DEXAMETHASONE SODIUM PHOSPHATE 120 MG/30ML IJ SOLN
2.0000 mg | Freq: Once | INTRAMUSCULAR | Status: AC
  Administered 2020-11-12: 2 mg via INTRA_ARTICULAR

## 2020-11-12 NOTE — Progress Notes (Signed)
She presents today for follow-up of her subtalar joint capsulitis left.  She states that it is doing better but my foot is hurting over here now she points to the posterior tibial tendon area.  She states that it was gone and now the last week or so it has come back and it feels worse than it did before currently she is using Voltaren gel.  Objective: Vital signs are stable she is alert oriented x3 she does not have pain on palpation medial calcaneal tubercle she does not have pain on palpation of the sinus tarsi nor does she have pain on inversion or eversion of the subtalar joint.  She does however have pain on palpation of the medial posterior tibial tendon at its insertion site of the navicular tuberosity.  Assessment: Insertional posterior tibial tendinitis left.  Plan: At this point I injected today with 2 mg of dexamethasone and local anesthetic to the point of maximal tenderness. I also placed her in a short cam walker and I will follow-up with her in 6 weeks.  May need to consider MRI.

## 2020-11-17 ENCOUNTER — Ambulatory Visit: Payer: Medicare Other | Admitting: Family Medicine

## 2020-11-24 ENCOUNTER — Ambulatory Visit
Admission: RE | Admit: 2020-11-24 | Discharge: 2020-11-24 | Disposition: A | Discharge status: Home / Self Care | Payer: Medicare Other | Source: Ambulatory Visit | Attending: Podiatry | Admitting: Podiatry

## 2020-11-24 ENCOUNTER — Other Ambulatory Visit: Payer: Self-pay

## 2020-11-24 DIAGNOSIS — M19072 Primary osteoarthritis, left ankle and foot: Secondary | ICD-10-CM | POA: Diagnosis not present

## 2020-11-24 DIAGNOSIS — R600 Localized edema: Secondary | ICD-10-CM | POA: Diagnosis not present

## 2020-11-24 DIAGNOSIS — M66872 Spontaneous rupture of other tendons, left ankle and foot: Secondary | ICD-10-CM

## 2020-11-24 DIAGNOSIS — M7732 Calcaneal spur, left foot: Secondary | ICD-10-CM | POA: Diagnosis not present

## 2020-11-25 ENCOUNTER — Telehealth: Payer: Self-pay | Admitting: *Deleted

## 2020-11-25 NOTE — Telephone Encounter (Signed)
-----   Message from Garrel Ridgel, [place] sent at 11/25/2020  7:19 AM EST ----- Arthritis and possible crack of the navicular bone.  Stay in boot and follow up with me in two to three weeks for xrays.

## 2020-11-25 NOTE — Telephone Encounter (Signed)
Called patient -   Informed patient of MRI results-advised she needed to go back to her boot and stay off her foot as much as possible. She has an appt for follow up next week but will have a scheduler move her out to Jan 6th.

## 2020-12-03 ENCOUNTER — Ambulatory Visit: Payer: Medicare Other | Admitting: Podiatry

## 2020-12-10 ENCOUNTER — Ambulatory Visit: Payer: Medicare Other

## 2020-12-10 ENCOUNTER — Encounter: Payer: Medicare Other | Admitting: Podiatry

## 2020-12-11 ENCOUNTER — Telehealth: Payer: Self-pay

## 2020-12-11 NOTE — Telephone Encounter (Signed)
Nurse Assessment Nurse: Genelle Gather RN, Magda Paganini Date/Time (Eastern Time): 12/11/2020 3:22:04 PM Confirm and document reason for call. If symptomatic, describe symptoms. ---Caller having abdominal pain for the past week. Pain is not constant. Pain is located in the middle pelvic region and "feels deep." Already takes pain med for back. Does the patient have any new or worsening symptoms? ---Yes Will a triage be completed? ---Yes Related visit to physician within the last 2 weeks? ---No Does the PT have any chronic conditions? (i.e. diabetes, asthma, this includes High risk factors for pregnancy, etc.) ---Yes List chronic conditions. ---CHF, Back pain, Foot injury Is this a behavioral health or substance abuse call? ---No Guidelines Guideline Title Affirmed Question Affirmed Notes Nurse Date/Time (Eastern Time) Abdominal Pain - Female [1] MODERATE pain (e.g., interferes with normal activities) AND [2] pain comes and goes (cramps) AND [3] present > 24 hours (Exception: pain with Vomiting or Diarrhea - see that Guideline) Huffine, RN, Magda Paganini 12/11/2020 3:25:20 PM Disp. Time Eilene Ghazi Time) Disposition Final User 12/11/2020 3:28:57 PM See PCP within 24 Hours Yes Huffine, RN, Magda Paganini PLEASE NOTE: All timestamps contained within this report are represented as [place] Standard Time. CONFIDENTIALTY NOTICE: This fax transmission is intended only for the addressee. It contains information that is legally privileged, confidential or otherwise protected from use or disclosure. If you are not the intended recipient, you are strictly prohibited from reviewing, disclosing, copying using or disseminating any of this information or taking any action in reliance on or regarding this information. If you have received this fax in error, please notify us immediately by telephone so that we can arrange for its return to [place]. Phone: [phone], Toll-Free: [phone], Fax: [phone] Page: 2 of 2 Call Id:  [id number] [place] Disagree/Comply Comply Caller Understands No PreDisposition Call Doctor Care Advice Given Per Guideline SEE PCP WITHIN 24 HOURS: * IF OFFICE WILL BE OPEN: You need to be examined within the next 24 hours. Call your doctor (or NP/PA) when the office opens and make an appointment. DIET: * Drink adequate fluids. Eat a bland diet. * Avoid alcohol or caffeinated beverages * Avoid greasy or fatty foods. OTC MEDS - BISMUTH SUBSALICYLATE (E.G., KAOPECTATE, PEPTOBISMOL): * Helps reduce abdominal cramping, diarrhea, and vomiting. CALL BACK IF: * Severe pain lasts over 1 hour * Constant pain lasts over 2 hours * You become worse Referrals REFERRED TO PCP OFFIC

## 2020-12-12 ENCOUNTER — Emergency Department (HOSPITAL_COMMUNITY)
Admission: EM | Admit: 2020-12-12 | Discharge: 2020-12-12 | Disposition: A | Discharge status: Home / Self Care | Payer: Medicare Other | Attending: Emergency Medicine | Admitting: Emergency Medicine

## 2020-12-12 ENCOUNTER — Encounter (HOSPITAL_COMMUNITY): Payer: Self-pay | Admitting: *Deleted

## 2020-12-12 ENCOUNTER — Ambulatory Visit (INDEPENDENT_AMBULATORY_CARE_PROVIDER_SITE_OTHER)
Admission: EM | Admit: 2020-12-12 | Discharge: 2020-12-12 | Disposition: A | Discharge status: Home / Self Care | Payer: Medicare Other | Source: Home / Self Care

## 2020-12-12 ENCOUNTER — Other Ambulatory Visit: Payer: Self-pay

## 2020-12-12 ENCOUNTER — Encounter (HOSPITAL_COMMUNITY): Payer: Self-pay | Admitting: Emergency Medicine

## 2020-12-12 DIAGNOSIS — R1030 Lower abdominal pain, unspecified: Secondary | ICD-10-CM | POA: Insufficient documentation

## 2020-12-12 DIAGNOSIS — Z5321 Procedure and treatment not carried out due to patient leaving prior to being seen by health care provider: Secondary | ICD-10-CM | POA: Diagnosis not present

## 2020-12-12 LAB — POCT URINALYSIS DIPSTICK, ED / UC
Bilirubin Urine: NEGATIVE
Glucose, UA: NEGATIVE mg/dL
Hgb urine dipstick: NEGATIVE
Ketones, ur: NEGATIVE mg/dL
Nitrite: NEGATIVE
Protein, ur: NEGATIVE mg/dL
Specific Gravity, Urine: 1.025 (ref 1.005–1.030)
Urobilinogen, UA: 0.2 mg/dL (ref 0.0–1.0)
pH: 7 (ref 5.0–8.0)

## 2020-12-12 MED ORDER — CIPROFLOXACIN HCL 500 MG PO TABS: 500.0000 mg | ORAL_TABLET | Freq: Two times a day (BID) | ORAL | 0 refills | Status: DC

## 2020-12-12 NOTE — ED Provider Notes (Signed)
[facility]    CSN: [id number] Arrival date & time: 12/12/20  1054      History   Chief Complaint No chief complaint on file.   HPI Carrie [name] is a 75 y.o. female.   Who carries a history of CHF and colon polyps s/p resection in 1998 presents with lower abdominal pain x 4-5 days. No prior history of these symptoms. She had a normal colonoscopy a few years back, no diverticula. Her pain is "sharp" at times with an episodic fashion. Sitting makes it worse and it has woken her up at times. She has noticed some urgency, but no dysuria or hematuria. She has no N, V or D. No acute back pain or SOB. She went to [facility] but due to the 15 hour wait, came here. Her pain is a 8 at times, but not currently.      Past Medical History:  Diagnosis Date  . BPPV (benign paroxysmal positional vertigo), bilateral 04/11/2019  . CHF (congestive heart failure) ([place])   . Colon polyps   . Esophageal spasm   . Fatigue   . Frequent PVCs   . GERD (gastroesophageal reflux disease)   . IBS (irritable bowel syndrome)   . Lumbar stenosis    "around L4,5, somewhere in there"  . 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 Neurogenic claudication ([place])   . Palpitations   . Recurrent UTI   . Seborrheic dermatitis of scalp 03/06/13  . Vitamin B 12 deficiency     Patient Active Problem List   Diagnosis Date Noted  . Primary osteoarthritis of left foot 07/10/2020  . Spinal stenosis of lumbar region with neurogenic claudication 04/15/2020  . Chronic bilateral low back pain with sciatica 04/15/2020  . Chronic combined systolic and diastolic CHF (congestive heart failure) ([place]) 03/27/2020  . Gait abnormality 10/14/2019  . Mixed hyperlipidemia 07/31/2019  . Vitamin D deficiency disease 05/01/2019  . Chronic renal disease, stage 3, moderately decreased glomerular filtration rate (GFR) between 30-59 mL/min/1.73 square meter ([place]) 05/01/2019  . History of  adenomatous polyp of colon 07/17/2018  . Bladder prolapse, female, acquired 09/21/2017  . IFG (impaired fasting glucose) 10/21/2016  . Gastro-esophageal reflux disease without esophagitis 10/20/2016  . Idiopathic peripheral neuropathy 04/08/2016  . Osteoarthritis of spine with radiculopathy, lumbar region 11/24/2015  . Osteopenia 05/28/2014  . Essential hypertension 03/05/2014  . Irritable bowel syndrome (IBS) 03/27/2013  . Former smoker 03/06/2013  . Seborrheic dermatitis of scalp 03/06/2013  . Allergic rhinitis 01/03/2012  . Symptomatic PVCs 01/03/2012    Past Surgical History:  Procedure Laterality Date  . CHOLECYSTECTOMY N/A 08/23/2016   Procedure: LAPAROSCOPIC CHOLECYSTECTOMY;  Surgeon: Rolm Bookbinder, MD;  Location: [facility];  Service: General;  Laterality: N/A;  . COLECTOMY Right 1998   large colonic polyp  . ENDOSCOPIC VEIN LASER TREATMENT Bilateral 2010-2011  . KNEE ARTHROSCOPY W/ MENISCECTOMY Left   . [place]  2001  . RIGHT/LEFT HEART CATH AND CORONARY ANGIOGRAPHY N/A 03/18/2020   Procedure: RIGHT/LEFT HEART CATH AND CORONARY ANGIOGRAPHY;  Surgeon: Jettie Booze, MD;  Location: [facility];  Service: Cardiovascular;  Laterality: N/A;  . SPINE SURGERY     3 since 11/2010, laminectomy, May 2012 lumbar fusion, diskectomy    OB History    Gravida  2   Para  2   Term      Preterm      AB  Living  2     SAB      IAB      Ectopic      Multiple      Live Births               Home Medications    Prior to Admission medications   Medication Sig Start Date End Date Taking? Authorizing Provider  ciprofloxacin (CIPRO) 500 MG tablet Take 1 tablet (500 mg total) by mouth every 12 (twelve) hours. 12/12/20  Yes Lenix Kidd, Vanessa Fillmore, PA-C  losartan (COZAAR) 25 MG tablet TAKE 1 TABLET BY MOUTH EVERY DAY 11/11/20   Bensimhon, Shaune Pascal, MD  azelastine (ASTELIN) 0.1 % nasal spray Place 2 sprays into both nostrils 2 (two) times daily. Patient  taking differently: Place 2 sprays into both nostrils 2 (two) times daily. As needed 10/15/19   Vivi Barrack, MD  calcium carbonate (TUMS CALCIUM FOR LIFE BONE) 750 MG chewable tablet Chew 750 mg by mouth every evening.     [provider]  Cholecalciferol (VITAMIN D3) 2000 units TABS Take 2,000 Units by mouth every evening.     [provider]  Ciclopirox 1 % shampoo Apply 1 application topically every other day.  04/04/14   [provider]  docusate sodium (COLACE) 100 MG capsule Take 100 mg by mouth every evening.     [provider]  famotidine (PEPCID) 40 MG tablet TAKE 1 TABLET (40 MG TOTAL) BY MOUTH DAILY AS NEEDED FOR HEARTBURN OR INDIGESTION. 08/17/20   Leamon Arnt, MD  HYDROcodone-acetaminophen (NORCO) 5-325 MG tablet Take 1 tablet by mouth 2 (two) times daily as needed for moderate pain. 10/12/20   Leamon Arnt, MD  ibuprofen (ADVIL) 200 MG tablet Take 200 mg by mouth daily as needed. 2 tabs    [provider]  mometasone (ELOCON) 0.1 % lotion Apply 1 application topically as needed for irritation. 01/30/20   [provider]  Multiple Vitamin (MULTIVITAMIN) tablet Take 1 tablet by mouth daily.    [provider]  sodium chloride (MURO 128) 2 % ophthalmic solution Place 1 drop into both eyes at bedtime.     [provider]  tretinoin (RETIN-A) 0.025 % cream Apply 1 application topically daily as needed (skin bumps).  08/18/16   [provider]    Family History Family History  Problem Relation Age of Onset  . Hypertension Mother   . High Cholesterol Mother   . Cancer Father        head and neck  . Hypertension Sister   . Hypertension Sister   . Diabetes type II Sister        was on steroids   . Hypertension Sister   . Hypertension Sister   . Cancer Sister        bladder  . Early death Neg Hx   . Diabetes Neg Hx   . Heart disease Neg Hx   . Kidney disease Neg Hx   . Stroke Neg Hx   . Breast  cancer Neg Hx   . Parkinson's disease Neg Hx     Social History Social History   Tobacco Use  . Smoking status: Former Smoker    Quit date: 02/02/2013    Years since quitting: 7.8  . Smokeless tobacco: Never Used  Vaping Use  . Vaping Use: Never used  Substance Use Topics  . Alcohol use: Yes    Alcohol/week: 3.0 - 5.0 standard drinks  Types: 3 - 5 Glasses of wine per week  . Drug use: No     Allergies   Phenergan [promethazine hcl], Erythromycin, Azithromycin, Epinephrine, and Nitrofurantoin   Review of Systems Review of Systems  Constitutional: Negative.  Negative for fever.  Gastrointestinal: Positive for abdominal pain. Negative for abdominal distention, anal bleeding, blood in stool, constipation, diarrhea, nausea and rectal pain.  Genitourinary: Positive for pelvic pain and urgency. Negative for decreased urine volume, difficulty urinating, hematuria, vaginal bleeding, vaginal discharge and vaginal pain.  Musculoskeletal: Positive for back pain.       Chronic back pain-unchanged  Psychiatric/Behavioral: Negative.      Physical Exam Triage Vital Signs ED Triage Vitals  Enc Vitals Group     BP 12/12/20 1149 (!) 151/78     Pulse Rate 12/12/20 1149 (!) 105     Resp 12/12/20 1149 17     Temp 12/12/20 1149 98.3 F (36.8 C)     Temp src --      SpO2 12/12/20 1149 100 %     Weight --      Height --      Head Circumference --      Peak Flow --      Pain Score 12/12/20 1147 6     Pain Loc --      Pain Edu? --      Excl. in [place]? --    No data found.  Updated Vital Signs BP (!) 151/78 (BP Location: Left Arm)   Pulse (!) 105   Temp 98.3 F (36.8 C)   Resp 17   SpO2 100%   Visual Acuity Right Eye Distance:   Left Eye Distance:   Bilateral Distance:    Right Eye Near:   Left Eye Near:    Bilateral Near:     Physical Exam Vitals and nursing note reviewed.  Constitutional:      General: She is not in acute distress.    Appearance: Normal appearance.  She is obese. She is not ill-appearing, toxic-appearing or diaphoretic.  HENT:     Head: Normocephalic and atraumatic.  Cardiovascular:     Rate and Rhythm: Normal rate and regular rhythm.  Pulmonary:     Effort: Pulmonary effort is normal.     Breath sounds: Normal breath sounds.  Abdominal:     General: There is no distension.     Palpations: There is no mass.     Tenderness: There is abdominal tenderness. There is no guarding or rebound.     Hernia: No hernia is present.     Comments: Tenderness along lower abdomen/pelvic region to palpation, no rebound or mass palpated. Remainder of abdominal exam benign  Skin:    General: Skin is warm.     Findings: No rash.  Neurological:     Mental Status: She is alert.  Psychiatric:        Mood and Affect: Mood normal.      UC Treatments / Results  Labs (all labs ordered are listed, but only abnormal results are displayed) Labs Reviewed  POCT URINALYSIS DIPSTICK, ED / UC - Abnormal; Notable for the following components:      Result Value   Leukocytes,Ua TRACE (*)    All other components within normal limits  URINE CULTURE    EKG   Radiology No results found.  Procedures Procedures (including critical care time)  Medications Ordered in UC Medications - No data to display  Initial Impression / Assessment and Plan /  UC Course  I have reviewed the triage vital signs and the nursing notes.  Pertinent labs & imaging results that were available during my care of the patient were reviewed by me and considered in my medical decision making (see chart for details).   Etiology unclear. Discussed need for CT Ab/[place], but patient resistant to going to ED to wait. I will cover her with Cipro given trac leuk's in urine, but my suspicion is low. Send for culture. Both colonoscopy and pap clear last few years. If she worsens she is instructed to go to the ED for emergent evaluation, otherwise call her PCP first thing Monday morning for an  appt and appt for CT. She is agreeable.    Final Clinical Impressions(s) / UC Diagnoses   Final diagnoses:  Lower abdominal pain     Discharge Instructions     Etiology is unclear but concerning. I will cover you with an antibiotic due to bacteria in your urine, but you should call Dr. Jonni Sanger Monday morning for urgent appt. If you worsen, then go on to the ED for full evaluation and more testing. Hope you feel better.    ED Prescriptions    Medication Sig Dispense Auth. Provider   ciprofloxacin (CIPRO) 500 MG tablet Take 1 tablet (500 mg total) by mouth every 12 (twelve) hours. 10 tablet Bjorn Pippin, PA-C     PDMP not reviewed this encounter.   Bjorn Pippin, PA-C 12/12/20 1247

## 2020-12-12 NOTE — Discharge Instructions (Addendum)
Etiology is unclear but concerning. I will cover you with an antibiotic due to bacteria in your urine, but you should call Dr. Jonni Sanger Monday morning for urgent appt. If you worsen, then go on to the ED for full evaluation and more testing. Hope you feel better.

## 2020-12-12 NOTE — ED Notes (Addendum)
Pt handed in stickers to registration and left with her husband.

## 2020-12-12 NOTE — ED Triage Notes (Signed)
Pt states that 4-5 days ago is when her  lower abdominal pain started. Pt states that she does feel nauseous with no appetite. Pt states that she did have a fever this morning and went to [facility] but did not want to wait.

## 2020-12-12 NOTE — ED Triage Notes (Signed)
several days of increased mid lower abd pain. States it is becoming more painful, called her PCP and was told to come to the ED

## 2020-12-14 ENCOUNTER — Encounter: Payer: Self-pay | Admitting: Family Medicine

## 2020-12-14 ENCOUNTER — Other Ambulatory Visit: Payer: Self-pay

## 2020-12-14 ENCOUNTER — Ambulatory Visit (INDEPENDENT_AMBULATORY_CARE_PROVIDER_SITE_OTHER): Payer: Medicare Other | Admitting: Family Medicine

## 2020-12-14 VITALS — BP 160/82 | HR 90 | Temp 98.1°F | Wt 182.0 lb

## 2020-12-14 DIAGNOSIS — R1030 Lower abdominal pain, unspecified: Secondary | ICD-10-CM

## 2020-12-14 LAB — URINE CULTURE

## 2020-12-14 MED ORDER — METRONIDAZOLE 500 MG PO TABS: 500.0000 mg | ORAL_TABLET | Freq: Two times a day (BID) | ORAL | 0 refills | Status: AC

## 2020-12-14 MED ORDER — HYDROCODONE-ACETAMINOPHEN 5-325 MG PO TABS: 1.0000 | ORAL_TABLET | Freq: Two times a day (BID) | ORAL | 0 refills | Status: DC | PRN

## 2020-12-14 NOTE — Patient Instructions (Signed)
Let me know if your symptoms worsen and I will order a CT scan.   Take miralax.   I have ordered pain medication refills. Your pepcid has refills left. Just notify your pharmacy.   Take the antibiotics for a week.

## 2020-12-14 NOTE — Progress Notes (Signed)
Subjective  CC:  Chief Complaint  Patient presents with  . Abdominal Pain    Was seen at [facility] there recommended abdominal CT scan, did start 5 day antibiotic. Still experiencing random episodes of pain     HPI: Carrie [name] is a 75 y.o. female who presents to the office today to address the problems listed above in the chief complaint.  75 year old female with history of chronic low back pain on chronic narcotics, IBS, Parkinson's-like syndrome presents for persistent lower abdominal pain.  I reviewed her chart.  She was seen by the urgent care over the weekend.  No imaging or lab work was done at that time.  She reports that her abdominal pain started about a week ago.  She points to the superior pubic area.  She describes a dull constant ache that is worse when she sits.  No radiation of pain.  No crampy bowels.  She reports mild constipation over the weekend but normal stools prior to that.  No melena or diarrhea.  Perhaps she had a low-grade fever up to 100 but she does not trust her thermometer.  No shaking chills.  No URI symptoms, cough, shortness of breath, nausea, vomiting, upper abdominal pain, urinary symptoms or gross hematuria.  She has no vaginal bleeding.  She is taking hydrocodone on occasion, takes daily for back pain but took a couple extra that did help alleviate her pain.  Pain has not kept her awake.  It is not worsened.  She felt a little better yesterday and today.  No history of diverticulitis.  She does have IBS but usually that crampy abdominal pain is more diffuse.  Review of systems, negative lower extremity edema, no flank pain Assessment  1. Lower abdominal pain      Plan   Lower abdominal pain for a week: Mildly hypertensive appears minimally uncomfortable.  Exam is benign.  Possible diverticulitis versus other etiologies discussed.  Could be related to IBS with constipation, due to chronic narcotic use.  We will check lab work, Cipro and Flagyl for  possible diverticulitis or colitis.  Follow-up in 48 hours if not proving.  Will recommend abdominal pelvic CT scan at that time.  Start MiraLAX for constipation.  Continue hydrocodone which was refilled early due to needing extra doses.  Patient understands and agrees with care plan  Follow up: As needed Visit date not found  Orders Placed This Encounter  Procedures  . CBC with Differential/Platelet  . Comprehensive metabolic panel  . Sedimentation rate   Meds ordered this encounter  Medications  . metroNIDAZOLE (FLAGYL) 500 MG tablet    Sig: Take 1 tablet (500 mg total) by mouth 2 (two) times daily for 7 days.    Dispense:  14 tablet    Refill:  0      I reviewed the patients updated PMH, FH, and SocHx.    Patient Active Problem List   Diagnosis Date Noted  . Spinal stenosis of lumbar region with neurogenic claudication 04/15/2020    Priority: High  . Chronic bilateral low back pain with sciatica 04/15/2020    Priority: High  . Chronic combined systolic and diastolic CHF (congestive heart failure) ([place]) 03/27/2020    Priority: High  . Gait abnormality 10/14/2019    Priority: High  . Mixed hyperlipidemia 07/31/2019    Priority: High  . History of adenomatous polyp of colon 07/17/2018    Priority: High  . IFG (impaired fasting glucose) 10/21/2016  Priority: High  . Idiopathic peripheral neuropathy 04/08/2016    Priority: High  . Essential hypertension 03/05/2014    Priority: High  . Chronic renal disease, stage 3, moderately decreased glomerular filtration rate (GFR) between 30-59 mL/min/1.73 square meter ([place]) 05/01/2019    Priority: Medium  . Gastro-esophageal reflux disease without esophagitis 10/20/2016    Priority: Medium  . Osteoarthritis of spine with radiculopathy, lumbar region 11/24/2015    Priority: Medium  . Osteopenia 05/28/2014    Priority: Medium  . Irritable bowel syndrome (IBS) 03/27/2013    Priority: Medium  . Former smoker 03/06/2013     Priority: Medium  . Symptomatic PVCs 01/03/2012    Priority: Medium  . Primary osteoarthritis of left foot 07/10/2020    Priority: Low  . Vitamin D deficiency disease 05/01/2019    Priority: Low  . Bladder prolapse, female, acquired 09/21/2017    Priority: Low  . Seborrheic dermatitis of scalp 03/06/2013    Priority: Low  . Allergic rhinitis 01/03/2012    Priority: Low   Current Meds  Medication Sig  . azelastine (ASTELIN) 0.1 % nasal spray Place 2 sprays into both nostrils 2 (two) times daily. (Patient taking differently: Place 2 sprays into both nostrils 2 (two) times daily. As needed)  . calcium carbonate (TUMS EX) 750 MG chewable tablet Chew 750 mg by mouth every evening.   . Cholecalciferol (VITAMIN D3) 2000 units TABS Take 2,000 Units by mouth every evening.   . Ciclopirox 1 % shampoo Apply 1 application topically every other day.  . ciprofloxacin (CIPRO) 500 MG tablet Take 1 tablet (500 mg total) by mouth every 12 (twelve) hours.  . docusate sodium (COLACE) 100 MG capsule Take 100 mg by mouth every evening.   . famotidine (PEPCID) 40 MG tablet TAKE 1 TABLET (40 MG TOTAL) BY MOUTH DAILY AS NEEDED FOR HEARTBURN OR INDIGESTION.  Marland Kitchen HYDROcodone-acetaminophen (NORCO) 5-325 MG tablet Take 1 tablet by mouth 2 (two) times daily as needed for moderate pain.  Marland Kitchen ibuprofen (ADVIL) 200 MG tablet Take 200 mg by mouth daily as needed. 2 tabs  . metroNIDAZOLE (FLAGYL) 500 MG tablet Take 1 tablet (500 mg total) by mouth 2 (two) times daily for 7 days.  . mometasone (ELOCON) 0.1 % lotion Apply 1 application topically as needed for irritation.  . Multiple Vitamin (MULTIVITAMIN) tablet Take 1 tablet by mouth daily.  . sodium chloride (MURO 128) 2 % ophthalmic solution Place 1 drop into both eyes at bedtime.   . tretinoin (RETIN-A) 0.025 % cream Apply 1 application topically daily as needed (skin bumps).   . [DISCONTINUED] losartan (COZAAR) 25 MG tablet TAKE 1 TABLET BY MOUTH EVERY DAY (Patient taking  differently: 12.5 mg.)    Allergies: Patient is allergic to phenergan [promethazine hcl], erythromycin, azithromycin, epinephrine, and nitrofurantoin. Family History: Patient family history includes Cancer in her father and sister; Diabetes type II in her sister; High Cholesterol in her mother; Hypertension in her mother, sister, sister, sister, and sister. Social History:  Patient  reports that she quit smoking about 7 years ago. She has never used smokeless tobacco. She reports current alcohol use of about 3.0 - 5.0 standard drinks of alcohol per week. She reports that she does not use drugs.  Review of Systems: Constitutional: Negative for fever malaise or anorexia Cardiovascular: negative for chest pain Respiratory: negative for SOB or persistent cough Gastrointestinal: negative for abdominal pain  Objective  Vitals: BP (!) 160/82   Pulse 90   Temp 98.1  F (36.7 C) (Temporal)   Wt 182 lb (82.6 kg)   SpO2 99%   BMI 31.73 kg/m  General: no acute distress , A&Ox3, appears tired but nontoxic HEENT: PEERL, conjunctiva normal, neck is supple Cardiovascular:  RRR without murmur or gallop.  Respiratory:  Good breath sounds bilaterally, CTAB with normal respiratory effort Gastrointestinal: soft, flat abdomen, normal active bowel sounds, no palpable masses, no hepatosplenomegaly, no appreciated hernias Mild suprapubic tenderness without rebound or guarding. Skin:  Warm, no rashes     Commons side effects, risks, benefits, and alternatives for medications and treatment plan prescribed today were discussed, and the patient expressed understanding of the given instructions. Patient is instructed to call or message via MyChart if he/she has any questions or concerns regarding our treatment plan. No barriers to understanding were identified. We discussed Red Flag symptoms and signs in detail. Patient expressed understanding regarding what to do in case of urgent or emergency type symptoms.    Medication list was reconciled, printed and provided to the patient in AVS. Patient instructions and summary information was reviewed with the patient as documented in the AVS. This note was prepared with assistance of Dragon voice recognition software. Occasional wrong-word or sound-a-like substitutions may have occurred due to the inherent limitations of voice recognition software  This visit occurred during the SARS-CoV-2 public health emergency.  Safety protocols were in place, including screening questions prior to the visit, additional usage of staff PPE, and extensive cleaning of exam room while observing appropriate contact time as indicated for disinfecting solutions.

## 2020-12-14 NOTE — Telephone Encounter (Signed)
FYI, appt today

## 2020-12-14 NOTE — Addendum Note (Signed)
Addended by: Billey Chang on: 12/14/2020 04:03 PM   Modules accepted: Orders

## 2020-12-15 LAB — CBC WITH DIFFERENTIAL/PLATELET
Basophils Absolute: 0.1 10*3/uL (ref 0.0–0.1)
Basophils Relative: 1.6 % (ref 0.0–3.0)
Eosinophils Absolute: 0.2 10*3/uL (ref 0.0–0.7)
Eosinophils Relative: 2.9 % (ref 0.0–5.0)
HCT: 35.4 % — ABNORMAL LOW (ref 36.0–46.0)
Hemoglobin: 11.8 g/dL — ABNORMAL LOW (ref 12.0–15.0)
Lymphocytes Relative: 20.7 % (ref 12.0–46.0)
Lymphs Abs: 1.6 10*3/uL (ref 0.7–4.0)
MCHC: 33.4 g/dL (ref 30.0–36.0)
MCV: 89.6 fl (ref 78.0–100.0)
Monocytes Absolute: 0.7 10*3/uL (ref 0.1–1.0)
Monocytes Relative: 9 % (ref 3.0–12.0)
Neutro Abs: 5.2 10*3/uL (ref 1.4–7.7)
Neutrophils Relative %: 65.8 % (ref 43.0–77.0)
Platelets: 249 10*3/uL (ref 150.0–400.0)
RBC: 3.95 Mil/uL (ref 3.87–5.11)
RDW: 12.7 % (ref 11.5–15.5)
WBC: 7.8 10*3/uL (ref 4.0–10.5)

## 2020-12-15 LAB — COMPREHENSIVE METABOLIC PANEL
ALT: 14 U/L (ref 0–35)
AST: 18 U/L (ref 0–37)
Albumin: 4.2 g/dL (ref 3.5–5.2)
Alkaline Phosphatase: 60 U/L (ref 39–117)
BUN: 20 mg/dL (ref 6–23)
CO2: 30 mEq/L (ref 19–32)
Calcium: 9.2 mg/dL (ref 8.4–10.5)
Chloride: 101 mEq/L (ref 96–112)
Creatinine, Ser: 0.99 mg/dL (ref 0.40–1.20)
GFR: 55.67 mL/min — ABNORMAL LOW (ref >60.00)
Glucose, Bld: 106 mg/dL — ABNORMAL HIGH (ref 70–99)
Potassium: 4.3 mEq/L (ref 3.5–5.1)
Sodium: 138 mEq/L (ref 135–145)
Total Bilirubin: 0.4 mg/dL (ref 0.2–1.2)
Total Protein: 7.1 g/dL (ref 6.0–8.3)

## 2020-12-15 LAB — SEDIMENTATION RATE: Sed Rate: 53 mm/hr — ABNORMAL HIGH (ref 0–30)

## 2020-12-17 ENCOUNTER — Telehealth: Payer: Self-pay

## 2020-12-17 NOTE — Telephone Encounter (Signed)
Jadon is calling in wanting to update Dr.Andy that she is doing a bit better, the pain has lightened up some but has not finished the medicine. Just wants to know what Dr.Andy wants to do next.

## 2020-12-17 NOTE — Telephone Encounter (Signed)
FYI

## 2020-12-18 ENCOUNTER — Other Ambulatory Visit: Payer: Self-pay

## 2020-12-18 DIAGNOSIS — R1030 Lower abdominal pain, unspecified: Secondary | ICD-10-CM

## 2020-12-18 NOTE — Telephone Encounter (Signed)
Called to inform patient that I have ordered CT scan. Patient states that she is feeling somewhat better on her medication, but does have concerns about that pain once she runs out of medicine. States she wants to hold off on CT scan. Advised we will still get scan approved by insurance, but she can still wait if she wants before scheduling. Concerns over upcoming weather.

## 2020-12-18 NOTE — Telephone Encounter (Signed)
Please order abd/pelvic ct with and without contrast for persistent lower abdominal/pelvic pain.

## 2020-12-18 NOTE — Progress Notes (Signed)
This encounter was created in error - please disregard.

## 2020-12-22 ENCOUNTER — Ambulatory Visit (INDEPENDENT_AMBULATORY_CARE_PROVIDER_SITE_OTHER): Payer: Medicare Other | Admitting: Podiatry

## 2020-12-22 ENCOUNTER — Ambulatory Visit (INDEPENDENT_AMBULATORY_CARE_PROVIDER_SITE_OTHER): Payer: Medicare Other

## 2020-12-22 ENCOUNTER — Encounter: Payer: Self-pay | Admitting: Podiatry

## 2020-12-22 ENCOUNTER — Other Ambulatory Visit: Payer: Self-pay

## 2020-12-22 ENCOUNTER — Ambulatory Visit: Payer: Medicare Other

## 2020-12-22 DIAGNOSIS — M76822 Posterior tibial tendinitis, left leg: Secondary | ICD-10-CM | POA: Diagnosis not present

## 2020-12-22 NOTE — Progress Notes (Signed)
She presents today for follow-up of her posterior tibial tendinitis of her left foot.  States that she seems to be doing much better.  She was today about 80% better at this point.  But has not been wearing her boot for the past couple of weeks.  She stated that as long as she is wearing the boot the foot seem to be getting better.  Objective: Vital signs are stable she is alert and oriented x3.  MRI does demonstrate insertional posterior tibial tendinitis and a possible fracture at the navicular tuberosity.  Several areas of significant osteoarthritic changes along the medial column.  Assessment: Resolving posterior tibial tendinitis with insertional fracture.  Plan: I am going to request that she wear the boot for another 3 to 4 weeks I will follow-up with her at that time if not improved orthotics will be necessary.

## 2020-12-23 DIAGNOSIS — G8929 Other chronic pain: Secondary | ICD-10-CM | POA: Diagnosis not present

## 2020-12-23 DIAGNOSIS — M545 Low back pain, unspecified: Secondary | ICD-10-CM | POA: Diagnosis not present

## 2020-12-23 DIAGNOSIS — M48062 Spinal stenosis, lumbar region with neurogenic claudication: Secondary | ICD-10-CM | POA: Diagnosis not present

## 2020-12-24 ENCOUNTER — Ambulatory Visit (HOSPITAL_BASED_OUTPATIENT_CLINIC_OR_DEPARTMENT_OTHER)
Admission: RE | Admit: 2020-12-24 | Discharge: 2020-12-24 | Disposition: A | Discharge status: Home / Self Care | Payer: Medicare Other | Source: Ambulatory Visit | Attending: Family Medicine | Admitting: Family Medicine

## 2020-12-24 ENCOUNTER — Encounter (HOSPITAL_BASED_OUTPATIENT_CLINIC_OR_DEPARTMENT_OTHER): Payer: Self-pay

## 2020-12-24 ENCOUNTER — Other Ambulatory Visit: Payer: Self-pay

## 2020-12-24 DIAGNOSIS — K529 Noninfective gastroenteritis and colitis, unspecified: Secondary | ICD-10-CM | POA: Diagnosis not present

## 2020-12-24 DIAGNOSIS — R1030 Lower abdominal pain, unspecified: Secondary | ICD-10-CM | POA: Insufficient documentation

## 2020-12-24 MED ORDER — IOHEXOL 300 MG/ML  SOLN
100.0000 mL | Freq: Once | INTRAMUSCULAR | Status: AC | PRN
  Administered 2020-12-24: 100 mL via INTRAVENOUS

## 2020-12-24 NOTE — Progress Notes (Signed)
Please call patient: I have reviewed his/her CT results. The scan is suggestive of improving diverticulitis as we expected. She also has constipation which can be contributing to her symtpoms.  Ensure she is taking miralax once or twice daily to help clean out her bowels.  There are no other worrisome findings present.  F/u if needed

## 2021-01-11 DIAGNOSIS — M48062 Spinal stenosis, lumbar region with neurogenic claudication: Secondary | ICD-10-CM | POA: Diagnosis not present

## 2021-01-19 ENCOUNTER — Encounter: Payer: Self-pay | Admitting: Podiatry

## 2021-01-19 ENCOUNTER — Other Ambulatory Visit: Payer: Self-pay

## 2021-01-19 ENCOUNTER — Ambulatory Visit (INDEPENDENT_AMBULATORY_CARE_PROVIDER_SITE_OTHER): Payer: Medicare Other | Admitting: Podiatry

## 2021-01-19 DIAGNOSIS — M76822 Posterior tibial tendinitis, left leg: Secondary | ICD-10-CM | POA: Diagnosis not present

## 2021-01-19 MED ORDER — TRIAMCINOLONE ACETONIDE 40 MG/ML IJ SUSP
20.0000 mg | Freq: Once | INTRAMUSCULAR | Status: AC
  Administered 2021-01-19: 20 mg

## 2021-01-19 NOTE — Progress Notes (Signed)
She presents today for follow-up of her posterior tibial tendinitis she states that it feels that is about 98% better is making her back hurt by dragging the big boot around but she has been wearing it ever since I saw her last month.  Objective: Vital signs are stable she is alert oriented x3 has a small amount of fluctuance on palpation inferior medial malleolus left.  Otherwise the posterior tibial tendon feels intact.  No longer has palpable pain on palpation of the navicular tuberosity.  Assessment: Resolving posterior tibial tendinitis with exception of a small amount just inferior to the medial malleolus.  Plan: Discussed etiology pathology conservative versus surgical therapies at this point I injected dexamethasone and local anesthetic to the point of maximal tenderness she understands that and is amenable to it.  I put her back in her tennis shoe placed navicular padding with a bunion guard and we will follow-up with her on an as needed basis.

## 2021-01-22 ENCOUNTER — Ambulatory Visit (INDEPENDENT_AMBULATORY_CARE_PROVIDER_SITE_OTHER): Payer: Medicare Other | Admitting: Internal Medicine

## 2021-01-22 ENCOUNTER — Other Ambulatory Visit: Payer: Self-pay

## 2021-01-22 ENCOUNTER — Encounter: Payer: Self-pay | Admitting: Internal Medicine

## 2021-01-22 VITALS — BP 140/68 | HR 90 | Ht 63.5 in | Wt 175.6 lb

## 2021-01-22 DIAGNOSIS — I493 Ventricular premature depolarization: Secondary | ICD-10-CM | POA: Diagnosis not present

## 2021-01-22 DIAGNOSIS — I5022 Chronic systolic (congestive) heart failure: Secondary | ICD-10-CM | POA: Diagnosis not present

## 2021-01-22 DIAGNOSIS — R000 Tachycardia, unspecified: Secondary | ICD-10-CM

## 2021-01-22 NOTE — Progress Notes (Signed)
PCP: Leamon Arnt, MD Primary Cardiologist: Dr Haroldine Laws Primary EP: Dr Eden Emms is a 75 y.o. female who presents today for routine electrophysiology followup.  Since last being seen in our clinic, the patient has had significant decline.  She is walking very slowly with a cane.  She has substantial back issues.  She has presumed parkinsons, though the diagnosis has not been confirmed.  She states "The least of my worries is my heart".  Today, she denies symptoms of palpitations, chest pain, shortness of breath,  lower extremity edema, dizziness, presyncope, or syncope.  The patient is otherwise without complaint today.   Past Medical History:  Diagnosis Date  . BPPV (benign paroxysmal positional vertigo), bilateral 04/11/2019  . CHF (congestive heart failure) ([place])   . Colon polyps   . Esophageal spasm   . Fatigue   . Frequent PVCs   . GERD (gastroesophageal reflux disease)   . IBS (irritable bowel syndrome)   . Lumbar stenosis    "around L4,5, somewhere in there"  . 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 Neurogenic claudication ([place])   . Palpitations   . Recurrent UTI   . Seborrheic dermatitis of scalp 03/06/13  . Vitamin B 12 deficiency    Past Surgical History:  Procedure Laterality Date  . CHOLECYSTECTOMY N/A 08/23/2016   Procedure: LAPAROSCOPIC CHOLECYSTECTOMY;  Surgeon: Rolm Bookbinder, MD;  Location: [facility];  Service: General;  Laterality: N/A;  . COLECTOMY Right 1998   large colonic polyp  . ENDOSCOPIC VEIN LASER TREATMENT Bilateral 2010-2011  . KNEE ARTHROSCOPY W/ MENISCECTOMY Left   . [place]  2001  . RIGHT/LEFT HEART CATH AND CORONARY ANGIOGRAPHY N/A 03/18/2020   Procedure: RIGHT/LEFT HEART CATH AND CORONARY ANGIOGRAPHY;  Surgeon: Jettie Booze, MD;  Location: [facility];  Service: Cardiovascular;  Laterality: N/A;  . SPINE SURGERY     3 since 11/2010, laminectomy, May 2012  lumbar fusion, diskectomy    ROS- all systems are reviewed and negatives except as per HPI above  Current Outpatient Medications  Medication Sig Dispense Refill  . azelastine (ASTELIN) 0.1 % nasal spray Place 1 spray into both nostrils as needed for rhinitis. Use in each nostril as directed    . calcium carbonate (TUMS EX) 750 MG chewable tablet Chew 750 mg by mouth every evening.     . Cholecalciferol (VITAMIN D3) 2000 units TABS Take 2,000 Units by mouth every evening.     . Ciclopirox 1 % shampoo Apply 1 application topically every other day.    . docusate sodium (COLACE) 100 MG capsule Take 100 mg by mouth every evening.     . famotidine (PEPCID) 40 MG tablet TAKE 1 TABLET (40 MG TOTAL) BY MOUTH DAILY AS NEEDED FOR HEARTBURN OR INDIGESTION. 90 tablet 3  . HYDROcodone-acetaminophen (NORCO) 5-325 MG tablet Take 1 tablet by mouth 2 (two) times daily as needed for moderate pain. 60 tablet 0  . Hyoscyamine Sulfate SL 0.125 MG SUBL     . ibuprofen (ADVIL) 200 MG tablet Take 200 mg by mouth daily as needed. 2 tabs    . losartan (COZAAR) 25 MG tablet 0.5 tablets (12.5 mg total).    . mometasone (ELOCON) 0.1 % lotion Apply 1 application topically as needed for irritation.    . Multiple Vitamin (MULTIVITAMIN) tablet Take 1 tablet by mouth daily.    . sodium chloride (MURO 128) 2 %  ophthalmic solution Place 1 drop into both eyes at bedtime.     . tretinoin (RETIN-A) 0.025 % cream Apply 1 application topically daily as needed (skin bumps).   0   No current facility-administered medications for this visit.    Physical Exam: Vitals:   01/22/21 1036  BP: 140/68  Pulse: 90  SpO2: 96%  Weight: 175 lb 9.6 oz (79.7 kg)  Height: 5' 3.5" (1.613 m)    GEN- The patient is chronically ill appearing, alert and oriented x 3 today.  Walks slowly with parkinsonian-like shuffle using a cane Head- normocephalic, atraumatic Eyes-  Sclera clear, conjunctiva pink Ears- hearing intact Oropharynx-  clear Lungs- Clear to ausculation bilaterally, normal work of breathing Heart- Regular rate and rhythm, no murmurs, rubs or gallops, PMI not laterally displaced GI- soft, NT, ND, + BS Extremities- no clubbing, cyanosis, or edema  Wt Readings from Last 3 Encounters:  01/22/21 175 lb 9.6 oz (79.7 kg)  12/14/20 182 lb (82.6 kg)  12/12/20 180 lb (81.6 kg)    EKG tracing ordered today is personally reviewed and shows sinus  Assessment and Plan:  1. PVCs Well controlled off norpace  2. Combined systolic and diastolic CHF/ nonischemic CM/ LV noncompaction She has seen Dr Broadus John for genetic discussions Follows with Dr Haroldine Laws Therapy has been limited by hypotension  3. Sinus tachycardia improved  Risks, benefits and potential toxicities for medications prescribed and/or refilled reviewed with patient today.   Return to see EP PA in a year  Thompson Grayer MD, [facility] 01/22/2021 10:51 AM

## 2021-01-22 NOTE — Patient Instructions (Addendum)
Medication Instructions:  Your physician recommends that you continue on your current medications as directed. Please refer to the Current Medication list given to you today.  Labwork: None ordered.  Testing/Procedures: None ordered.  Follow-Up: Your physician wants you to follow-up in: one year with    Michael "Andy" Tillery, PA-C   You will receive a reminder letter in the mail two months in advance. If you don't receive a letter, please call our office to schedule the follow-up appointment.   Any Other Special Instructions Will Be Listed Below (If Applicable).  If you need a refill on your cardiac medications before your next appointment, please call your pharmacy.         

## 2021-01-25 ENCOUNTER — Telehealth: Payer: Self-pay | Admitting: Neurology

## 2021-01-25 DIAGNOSIS — G20 Parkinson's disease: Secondary | ICD-10-CM

## 2021-01-25 NOTE — Telephone Encounter (Signed)
Noted thanks °

## 2021-01-25 NOTE — Telephone Encounter (Signed)
Patient called and stated she is ready to have DAT Scan Now . I will get re - auth from her insurance  . Thanks Hinton Dyer

## 2021-02-02 ENCOUNTER — Ambulatory Visit: Payer: Medicare Other

## 2021-02-03 DIAGNOSIS — Z6830 Body mass index (BMI) 30.0-30.9, adult: Secondary | ICD-10-CM | POA: Diagnosis not present

## 2021-02-03 DIAGNOSIS — I10 Essential (primary) hypertension: Secondary | ICD-10-CM | POA: Diagnosis not present

## 2021-02-03 DIAGNOSIS — M5136 Other intervertebral disc degeneration, lumbar region: Secondary | ICD-10-CM | POA: Diagnosis not present

## 2021-02-03 DIAGNOSIS — M48062 Spinal stenosis, lumbar region with neurogenic claudication: Secondary | ICD-10-CM | POA: Diagnosis not present

## 2021-02-03 DIAGNOSIS — M47816 Spondylosis without myelopathy or radiculopathy, lumbar region: Secondary | ICD-10-CM | POA: Diagnosis not present

## 2021-02-03 DIAGNOSIS — G629 Polyneuropathy, unspecified: Secondary | ICD-10-CM | POA: Diagnosis not present

## 2021-02-04 ENCOUNTER — Telehealth: Payer: Self-pay

## 2021-02-04 ENCOUNTER — Ambulatory Visit (HOSPITAL_COMMUNITY)
Admission: RE | Admit: 2021-02-04 | Discharge: 2021-02-04 | Disposition: A | Discharge status: Home / Self Care | Payer: Medicare Other | Source: Ambulatory Visit | Attending: Neurology | Admitting: Neurology

## 2021-02-04 ENCOUNTER — Encounter (HOSPITAL_COMMUNITY)
Admission: RE | Admit: 2021-02-04 | Discharge: 2021-02-04 | Disposition: A | Discharge status: Home / Self Care | Payer: Medicare Other | Source: Ambulatory Visit | Attending: Neurology | Admitting: Neurology

## 2021-02-04 ENCOUNTER — Other Ambulatory Visit: Payer: Self-pay

## 2021-02-04 DIAGNOSIS — G20 Parkinson's disease: Secondary | ICD-10-CM | POA: Diagnosis not present

## 2021-02-04 DIAGNOSIS — R251 Tremor, unspecified: Secondary | ICD-10-CM | POA: Diagnosis not present

## 2021-02-04 DIAGNOSIS — R531 Weakness: Secondary | ICD-10-CM | POA: Diagnosis not present

## 2021-02-04 DIAGNOSIS — R269 Unspecified abnormalities of gait and mobility: Secondary | ICD-10-CM | POA: Diagnosis not present

## 2021-02-04 MED ORDER — POTASSIUM IODIDE (ANTIDOTE) 130 MG PO TABS
ORAL_TABLET | ORAL | Status: AC
  Administered 2021-02-04: 130 mg via ORAL
  Filled 2021-02-04: qty 1

## 2021-02-04 MED ORDER — POTASSIUM IODIDE (ANTIDOTE) 130 MG PO TABS: 130.0000 mg | ORAL_TABLET | Freq: Once | ORAL | Status: AC

## 2021-02-04 MED ORDER — IOFLUPANE I 123 185 MBQ/2.5ML IV SOLN
5.1000 | Freq: Once | INTRAVENOUS | Status: AC | PRN
  Administered 2021-02-04: 5.1 via INTRAVENOUS
  Filled 2021-02-04: qty 10

## 2021-02-04 NOTE — Telephone Encounter (Signed)
It is safe to take daily. It is not abosrbed. No side effects. Can use as often as needed for her constipation.

## 2021-02-04 NOTE — Telephone Encounter (Signed)
Patient has concerns about taking Miralax 5-6 times a week. Wanting to know if this can have any side effects taken long term. Please advise

## 2021-02-04 NOTE — Telephone Encounter (Signed)
Pt has a question in regards to what she is taking for constipation and if it is okay to take long term. Pt requesting call back

## 2021-02-05 NOTE — Telephone Encounter (Signed)
Patient aware of PCP recommendation listed below. Patient wanted to let Dr. Jonni Sanger know that she was tested for Parkinson's yesterday and results are positive.

## 2021-02-06 ENCOUNTER — Ambulatory Visit
Admission: RE | Admit: 2021-02-06 | Discharge: 2021-02-06 | Disposition: A | Discharge status: Home / Self Care | Payer: Medicare Other | Source: Ambulatory Visit | Attending: Family Medicine | Admitting: Family Medicine

## 2021-02-06 ENCOUNTER — Other Ambulatory Visit: Payer: Self-pay

## 2021-02-06 DIAGNOSIS — Z1231 Encounter for screening mammogram for malignant neoplasm of breast: Secondary | ICD-10-CM

## 2021-02-08 NOTE — Telephone Encounter (Signed)
Pt. states that she has questions regarding DAT scan & treatment. She would like a call back from Dr. or RN after 1pm.

## 2021-02-08 NOTE — Telephone Encounter (Signed)
I returned the call to the patient and her husband. She would like the referral placed to [place].   She did go see Dr. Vertell Limber for her lumbar issues. She tried an epidural steroid injection without much relief. She has a lumbar MRI scan scheduled.

## 2021-02-08 NOTE — Addendum Note (Signed)
Addended by: Noberto Retort C on: 02/08/2021 10:00 AM   Modules accepted: Orders

## 2021-02-11 DIAGNOSIS — M545 Low back pain, unspecified: Secondary | ICD-10-CM | POA: Diagnosis not present

## 2021-02-11 DIAGNOSIS — M48062 Spinal stenosis, lumbar region with neurogenic claudication: Secondary | ICD-10-CM | POA: Diagnosis not present

## 2021-02-24 DIAGNOSIS — M47816 Spondylosis without myelopathy or radiculopathy, lumbar region: Secondary | ICD-10-CM | POA: Diagnosis not present

## 2021-02-24 DIAGNOSIS — M48062 Spinal stenosis, lumbar region with neurogenic claudication: Secondary | ICD-10-CM | POA: Diagnosis not present

## 2021-02-24 DIAGNOSIS — G20 Parkinson's disease: Secondary | ICD-10-CM | POA: Diagnosis not present

## 2021-02-24 DIAGNOSIS — M5136 Other intervertebral disc degeneration, lumbar region: Secondary | ICD-10-CM | POA: Diagnosis not present

## 2021-03-01 ENCOUNTER — Telehealth: Payer: Self-pay | Admitting: Neurology

## 2021-03-01 NOTE — Telephone Encounter (Signed)
I called pt back. She states she is scheduled to see Dr. Linus Mako at [facility] and pt is asking for a copy of the DAT scan (CD) to be sent if possible. I will fwd to medical records asking if this could be done.   I advised we have already sent the office notes.

## 2021-03-01 NOTE — Telephone Encounter (Signed)
Pt called and LVM for RN for her to call back to give her some information she is needing. Pt did not leave any other information. Please advise.

## 2021-03-02 ENCOUNTER — Other Ambulatory Visit: Payer: Self-pay

## 2021-03-02 ENCOUNTER — Encounter: Payer: Self-pay | Admitting: Podiatry

## 2021-03-02 ENCOUNTER — Ambulatory Visit (INDEPENDENT_AMBULATORY_CARE_PROVIDER_SITE_OTHER): Payer: Medicare Other | Admitting: Podiatry

## 2021-03-02 DIAGNOSIS — M778 Other enthesopathies, not elsewhere classified: Secondary | ICD-10-CM

## 2021-03-02 NOTE — Telephone Encounter (Signed)
I was not able to reach the patient. Pt needs to go to [place] admitting office to p/u her Dat Scan. [place] # [phone]

## 2021-03-02 NOTE — Progress Notes (Signed)
She presents today for follow-up of her tenderness beneath the first metatarsophalangeal joint.  He states that she has been wearing pads underneath there she states that she was just diagnosed with Parkinson's disease.  She states that she has no posterior tibial pain whatsoever.  Objective: Vital signs are stable she is alert oriented x3.  There is no erythema edema cellulitis drainage odor she has absolutely no fat pad left no subcutaneous tissues left remarkably thin skin with painful sesamoiditis on physical exam.  Assessment: Hallux valgus with sesamoiditis and fat pad atrophy.  Plan: Provided her a silicone metatarsal pad the slips over the forefoot I will follow-up with her on an as-needed basis.

## 2021-03-08 ENCOUNTER — Telehealth: Payer: Self-pay | Admitting: *Deleted

## 2021-03-08 DIAGNOSIS — R2689 Other abnormalities of gait and mobility: Secondary | ICD-10-CM | POA: Diagnosis not present

## 2021-03-08 DIAGNOSIS — M47816 Spondylosis without myelopathy or radiculopathy, lumbar region: Secondary | ICD-10-CM | POA: Diagnosis not present

## 2021-03-08 DIAGNOSIS — G20 Parkinson's disease: Secondary | ICD-10-CM | POA: Diagnosis not present

## 2021-03-08 DIAGNOSIS — R278 Other lack of coordination: Secondary | ICD-10-CM | POA: Diagnosis not present

## 2021-03-08 DIAGNOSIS — M48062 Spinal stenosis, lumbar region with neurogenic claudication: Secondary | ICD-10-CM | POA: Diagnosis not present

## 2021-03-08 DIAGNOSIS — M5136 Other intervertebral disc degeneration, lumbar region: Secondary | ICD-10-CM | POA: Diagnosis not present

## 2021-03-08 DIAGNOSIS — M5459 Other low back pain: Secondary | ICD-10-CM | POA: Diagnosis not present

## 2021-03-08 NOTE — Telephone Encounter (Signed)
I called [place] imaging office not able to anyone, Imaging phone # [phone]

## 2021-03-09 ENCOUNTER — Other Ambulatory Visit: Payer: Self-pay

## 2021-03-09 ENCOUNTER — Other Ambulatory Visit (HOSPITAL_COMMUNITY): Payer: Self-pay | Admitting: *Deleted

## 2021-03-09 ENCOUNTER — Telehealth: Payer: Self-pay

## 2021-03-09 DIAGNOSIS — I5022 Chronic systolic (congestive) heart failure: Secondary | ICD-10-CM

## 2021-03-09 MED ORDER — HYDROCODONE-ACETAMINOPHEN 5-325 MG PO TABS: 1.0000 | ORAL_TABLET | Freq: Two times a day (BID) | ORAL | 0 refills | Status: DC | PRN

## 2021-03-09 NOTE — Telephone Encounter (Signed)
Refill request sent to PCP.

## 2021-03-09 NOTE — Telephone Encounter (Signed)
Last refill: 12/14/20 #60, 0 Last OV: 12/14/20 dx. Abdominal pain

## 2021-03-09 NOTE — Telephone Encounter (Signed)
.   LAST APPOINTMENT DATE: 12/14/2020   NEXT APPOINTMENT DATE:@Visit  date not found  MEDICATION:HYDROcodone-acetaminophen (NORCO) 5-325 MG tablet   PHARMACY:[id number] - [place], [place] - [address]

## 2021-03-10 ENCOUNTER — Telehealth: Payer: Self-pay | Admitting: *Deleted

## 2021-03-10 DIAGNOSIS — M47816 Spondylosis without myelopathy or radiculopathy, lumbar region: Secondary | ICD-10-CM | POA: Diagnosis not present

## 2021-03-10 DIAGNOSIS — R2689 Other abnormalities of gait and mobility: Secondary | ICD-10-CM | POA: Diagnosis not present

## 2021-03-10 DIAGNOSIS — M5459 Other low back pain: Secondary | ICD-10-CM | POA: Diagnosis not present

## 2021-03-10 DIAGNOSIS — M5136 Other intervertebral disc degeneration, lumbar region: Secondary | ICD-10-CM | POA: Diagnosis not present

## 2021-03-10 DIAGNOSIS — R278 Other lack of coordination: Secondary | ICD-10-CM | POA: Diagnosis not present

## 2021-03-10 DIAGNOSIS — M48062 Spinal stenosis, lumbar region with neurogenic claudication: Secondary | ICD-10-CM | POA: Diagnosis not present

## 2021-03-10 DIAGNOSIS — G20 Parkinson's disease: Secondary | ICD-10-CM | POA: Diagnosis not present

## 2021-03-10 NOTE — Telephone Encounter (Signed)
Pt brought physician attestation form for travel insurance reimbursement as pt canceled trip due to new diagnosis. Form completed, signed, and sent to medical records for processing.

## 2021-03-10 NOTE — Telephone Encounter (Signed)
Pt attending Physician from mailed on 03/10/21 to pt home address.

## 2021-03-15 DIAGNOSIS — G20 Parkinson's disease: Secondary | ICD-10-CM | POA: Diagnosis not present

## 2021-03-15 DIAGNOSIS — R2689 Other abnormalities of gait and mobility: Secondary | ICD-10-CM | POA: Diagnosis not present

## 2021-03-15 DIAGNOSIS — M48062 Spinal stenosis, lumbar region with neurogenic claudication: Secondary | ICD-10-CM | POA: Diagnosis not present

## 2021-03-15 DIAGNOSIS — M5136 Other intervertebral disc degeneration, lumbar region: Secondary | ICD-10-CM | POA: Diagnosis not present

## 2021-03-15 DIAGNOSIS — M47816 Spondylosis without myelopathy or radiculopathy, lumbar region: Secondary | ICD-10-CM | POA: Diagnosis not present

## 2021-03-15 DIAGNOSIS — R278 Other lack of coordination: Secondary | ICD-10-CM | POA: Diagnosis not present

## 2021-03-15 DIAGNOSIS — M5459 Other low back pain: Secondary | ICD-10-CM | POA: Diagnosis not present

## 2021-03-18 ENCOUNTER — Ambulatory Visit (INDEPENDENT_AMBULATORY_CARE_PROVIDER_SITE_OTHER): Payer: Medicare Other

## 2021-03-18 DIAGNOSIS — M47816 Spondylosis without myelopathy or radiculopathy, lumbar region: Secondary | ICD-10-CM | POA: Diagnosis not present

## 2021-03-18 DIAGNOSIS — M48062 Spinal stenosis, lumbar region with neurogenic claudication: Secondary | ICD-10-CM | POA: Diagnosis not present

## 2021-03-18 DIAGNOSIS — G20 Parkinson's disease: Secondary | ICD-10-CM | POA: Diagnosis not present

## 2021-03-18 DIAGNOSIS — Z0000 Encounter for general adult medical examination without abnormal findings: Secondary | ICD-10-CM | POA: Diagnosis not present

## 2021-03-18 DIAGNOSIS — M5136 Other intervertebral disc degeneration, lumbar region: Secondary | ICD-10-CM | POA: Diagnosis not present

## 2021-03-18 DIAGNOSIS — M5459 Other low back pain: Secondary | ICD-10-CM | POA: Diagnosis not present

## 2021-03-18 DIAGNOSIS — R278 Other lack of coordination: Secondary | ICD-10-CM | POA: Diagnosis not present

## 2021-03-18 DIAGNOSIS — R2689 Other abnormalities of gait and mobility: Secondary | ICD-10-CM | POA: Diagnosis not present

## 2021-03-18 NOTE — Patient Instructions (Signed)
Carrie [name] , Thank you for taking time to come for your Medicare Wellness Visit. I appreciate your ongoing commitment to your health goals. Please review the following plan we discussed and let me know if I can assist you in the future.   Screening recommendations/referrals: Colonoscopy: Done 06/25/15 Mammogram: Done 02/06/21 Bone Density: Done 11/22/19 Recommended yearly ophthalmology/optometry visit for glaucoma screening and checkup Recommended yearly dental visit for hygiene and checkup  Vaccinations: Influenza vaccine: Up to date Pneumococcal vaccine: Up to date Tdap vaccine: Up to date Shingles vaccine: Completed 10/18/19 & 11/04/19   Covid-19:Completed 1/12, 2/10, & 10/20/20  Advanced directives: Please bring a copy of your health care power of attorney and living will to the office at your convenience.  Conditions/risks identified: Get outside more   Next appointment: Follow up in one year for your annual wellness visit    Preventive Care 65 Years and Older, Female Preventive care refers to lifestyle choices and visits with your health care provider that can promote health and wellness. What does preventive care include?  A yearly physical exam. This is also called an annual well check.  Dental exams once or twice a year.  Routine eye exams. Ask your health care provider how often you should have your eyes checked.  Personal lifestyle choices, including:  Daily care of your teeth and gums.  Regular physical activity.  Eating a healthy diet.  Avoiding tobacco and drug use.  Limiting alcohol use.  Practicing safe sex.  Taking low-dose aspirin every day.  Taking vitamin and mineral supplements as recommended by your health care provider. What happens during an annual well check? The services and screenings done by your health care provider during your annual well check will depend on your age, overall health, lifestyle risk factors, and family history of  disease. Counseling  Your health care provider may ask you questions about your:  Alcohol use.  Tobacco use.  Drug use.  Emotional well-being.  Home and relationship well-being.  Sexual activity.  Eating habits.  History of falls.  Memory and ability to understand (cognition).  Work and work Statistician.  Reproductive health. Screening  You may have the following tests or measurements:  Height, weight, and BMI.  Blood pressure.  Lipid and cholesterol levels. These may be checked every 5 years, or more frequently if you are over 31 years old.  Skin check.  Lung cancer screening. You may have this screening every year starting at age 49 if you have a 30-pack-year history of smoking and currently smoke or have quit within the past 15 years.  Fecal occult blood test (FOBT) of the stool. You may have this test every year starting at age 50.  Flexible sigmoidoscopy or colonoscopy. You may have a sigmoidoscopy every 5 years or a colonoscopy every 10 years starting at age 107.  Hepatitis C blood test.  Hepatitis B blood test.  Sexually transmitted disease (STD) testing.  Diabetes screening. This is done by checking your blood sugar (glucose) after you have not eaten for a while (fasting). You may have this done every 1-3 years.  Bone density scan. This is done to screen for osteoporosis. You may have this done starting at age 46.  Mammogram. This may be done every 1-2 years. Talk to your health care provider about how often you should have regular mammograms. Talk with your health care provider about your test results, treatment options, and if necessary, the need for more tests. Vaccines  Your health care provider  may recommend certain vaccines, such as:  Influenza vaccine. This is recommended every year.  Tetanus, diphtheria, and acellular pertussis (Tdap, Td) vaccine. You may need a Td booster every 10 years.  Zoster vaccine. You may need this after age  83.  Pneumococcal 13-valent conjugate (PCV13) vaccine. One dose is recommended after age 14.  Pneumococcal polysaccharide (PPSV23) vaccine. One dose is recommended after age 78. Talk to your health care provider about which screenings and vaccines you need and how often you need them. This information is not intended to replace advice given to you by your health care provider. Make sure you discuss any questions you have with your health care provider. Document Released: 12/18/2015 Document Revised: 08/10/2016 Document Reviewed: 09/22/2015 Elsevier Interactive Patient Education  2017 [employer] Prevention in the Home Falls can cause injuries. They can happen to people of all ages. There are many things you can do to make your home safe and to help prevent falls. What can I do on the outside of my home?  Regularly fix the edges of walkways and driveways and fix any cracks.  Remove anything that might make you trip as you walk through a door, such as a raised step or threshold.  Trim any bushes or trees on the path to your home.  Use bright outdoor lighting.  Clear any walking paths of anything that might make someone trip, such as rocks or tools.  Regularly check to see if handrails are loose or broken. Make sure that both sides of any steps have handrails.  Any raised decks and porches should have guardrails on the edges.  Have any leaves, snow, or ice cleared regularly.  Use sand or salt on walking paths during winter.  Clean up any spills in your garage right away. This includes oil or grease spills. What can I do in the bathroom?  Use night lights.  Install grab bars by the toilet and in the tub and shower. Do not use towel bars as grab bars.  Use non-skid mats or decals in the tub or shower.  If you need to sit down in the shower, use a plastic, non-slip stool.  Keep the floor dry. Clean up any water that spills on the floor as soon as it happens.  Remove  soap buildup in the tub or shower regularly.  Attach bath mats securely with double-sided non-slip rug tape.  Do not have throw rugs and other things on the floor that can make you trip. What can I do in the bedroom?  Use night lights.  Make sure that you have a light by your bed that is easy to reach.  Do not use any sheets or blankets that are too big for your bed. They should not hang down onto the floor.  Have a firm chair that has side arms. You can use this for support while you get dressed.  Do not have throw rugs and other things on the floor that can make you trip. What can I do in the kitchen?  Clean up any spills right away.  Avoid walking on wet floors.  Keep items that you use a lot in easy-to-reach places.  If you need to reach something above you, use a strong step stool that has a grab bar.  Keep electrical cords out of the way.  Do not use floor polish or wax that makes floors slippery. If you must use wax, use non-skid floor wax.  Do not have throw rugs  and other things on the floor that can make you trip. What can I do with my stairs?  Do not leave any items on the stairs.  Make sure that there are handrails on both sides of the stairs and use them. Fix handrails that are broken or loose. Make sure that handrails are as long as the stairways.  Check any carpeting to make sure that it is firmly attached to the stairs. Fix any carpet that is loose or worn.  Avoid having throw rugs at the top or bottom of the stairs. If you do have throw rugs, attach them to the floor with carpet tape.  Make sure that you have a light switch at the top of the stairs and the bottom of the stairs. If you do not have them, ask someone to add them for you. What else can I do to help prevent falls?  Wear shoes that:  Do not have high heels.  Have rubber bottoms.  Are comfortable and fit you well.  Are closed at the toe. Do not wear sandals.  If you use a  stepladder:  Make sure that it is fully opened. Do not climb a closed stepladder.  Make sure that both sides of the stepladder are locked into place.  Ask someone to hold it for you, if possible.  Clearly mark and make sure that you can see:  Any grab bars or handrails.  First and last steps.  Where the edge of each step is.  Use tools that help you move around (mobility aids) if they are needed. These include:  Canes.  Walkers.  Scooters.  Crutches.  Turn on the lights when you go into a dark area. Replace any light bulbs as soon as they burn out.  Set up your furniture so you have a clear path. Avoid moving your furniture around.  If any of your floors are uneven, fix them.  If there are any pets around you, be aware of where they are.  Review your medicines with your doctor. Some medicines can make you feel dizzy. This can increase your chance of falling. Ask your doctor what other things that you can do to help prevent falls. This information is not intended to replace advice given to you by your health care provider. Make sure you discuss any questions you have with your health care provider. Document Released: 09/17/2009 Document Revised: 04/28/2016 Document Reviewed: 12/26/2014 Elsevier Interactive Patient Education  2017 [employer].

## 2021-03-18 NOTE — Progress Notes (Signed)
Virtual Visit via Telephone Note  I connected with  Carrie [name] on 03/18/21 at 11:45 AM EDT by telephone and verified that I am speaking with the correct person using two identifiers.  Medicare Annual Wellness visit completed telephonically due to Covid-19 pandemic.   Persons participating in this call: This Health Coach and this patient.   Location: Patient: Home Provider: Office   I discussed the limitations, risks, security and privacy concerns of performing an evaluation and management service by telephone and the availability of in person appointments. The patient expressed understanding and agreed to proceed.  Unable to perform video visit due to video visit attempted and failed and/or patient does not have video capability.   Some vital signs may be absent or patient reported.   Willette Brace, LPN    Subjective:   Carrie [name] is a 75 y.o. female who presents for Medicare Annual (Subsequent) preventive examination.  Review of Systems     Cardiac Risk Factors include: advanced age (>76men, >93 women);obesity (BMI >30kg/m2);hypertension;dyslipidemia     Objective:    Today's Vitals   03/18/21 1136  PainSc: 3    There is no height or weight on file to calculate BMI.  Advanced Directives 03/18/2021 12/12/2020 12/10/2019 11/22/2019 09/26/2017 08/22/2016  Does Patient Have a Medical Advance Directive? Yes Yes Yes Yes Yes No  Type of Advance Directive Living will;Healthcare Power of Attorney - Living will;Healthcare Power of Attorney Living will;Healthcare Power of [place];Living will -  Does patient want to make changes to medical advance directive? - - No - Patient declined No - Patient declined - -  Copy of [place] in Chart? No - copy requested - No - copy requested No - copy requested Yes -  Would patient like information on creating a medical advance directive? - - - - - No - patient declined information     Current Medications (verified) Outpatient Encounter Medications as of 03/18/2021  Medication Sig  . azelastine (ASTELIN) 0.1 % nasal spray Place 1 spray into both nostrils as needed for rhinitis. Use in each nostril as directed  . calcium carbonate (TUMS EX) 750 MG chewable tablet Chew 750 mg by mouth every evening.   . Cholecalciferol (VITAMIN D3) 2000 units TABS Take 2,000 Units by mouth every evening.   . Ciclopirox 1 % shampoo Apply 1 application topically every other day.  . docusate sodium (COLACE) 100 MG capsule Take 100 mg by mouth every evening.   . famotidine (PEPCID) 40 MG tablet TAKE 1 TABLET (40 MG TOTAL) BY MOUTH DAILY AS NEEDED FOR HEARTBURN OR INDIGESTION.  . fluticasone (FLONASE) 50 MCG/ACT nasal spray Place into both nostrils daily.  Marland Kitchen HYDROcodone-acetaminophen (NORCO) 5-325 MG tablet Take 1 tablet by mouth 2 (two) times daily as needed for moderate pain.  Marland Kitchen losartan (COZAAR) 25 MG tablet 0.5 tablets (12.5 mg total).  . mometasone (ELOCON) 0.1 % lotion Apply 1 application topically as needed for irritation.  . Multiple Vitamin (MULTIVITAMIN) tablet Take 1 tablet by mouth daily.  . Polyethylene Glycol 3350 (MIRALAX PO) Take by mouth. 3-4 times a week  . sodium chloride (MURO 128) 2 % ophthalmic solution Place 1 drop into both eyes at bedtime.   . tretinoin (RETIN-A) 0.025 % cream Apply 1 application topically daily as needed (skin bumps).   . Wheat Dextrin (BENEFIBER PO) Take by mouth.  . Hyoscyamine Sulfate SL 0.125 MG SUBL  (Patient not taking: Reported on 03/18/2021)  . [  DISCONTINUED] ibuprofen (ADVIL) 200 MG tablet Take 200 mg by mouth daily as needed. 2 tabs   No facility-administered encounter medications on file as of 03/18/2021.    Allergies (verified) Phenergan [promethazine hcl], Erythromycin, Other, Azithromycin, Epinephrine, and Nitrofurantoin   History: Past Medical History:  Diagnosis Date  . BPPV (benign paroxysmal positional vertigo), bilateral  04/11/2019  . CHF (congestive heart failure) ([place])   . Colon polyps   . Esophageal spasm   . Fatigue   . Frequent PVCs   . GERD (gastroesophageal reflux disease)   . IBS (irritable bowel syndrome)   . Lumbar stenosis    "around L4,5, somewhere in there"  . 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 Neurogenic claudication ([place])   . Palpitations   . Parkinson disease ([place])    per pt   . Recurrent UTI   . Seborrheic dermatitis of scalp 03/06/13  . Vitamin B 12 deficiency    Past Surgical History:  Procedure Laterality Date  . CHOLECYSTECTOMY N/A 08/23/2016   Procedure: LAPAROSCOPIC CHOLECYSTECTOMY;  Surgeon: Rolm Bookbinder, MD;  Location: [facility];  Service: General;  Laterality: N/A;  . COLECTOMY Right 1998   large colonic polyp  . ENDOSCOPIC VEIN LASER TREATMENT Bilateral 2010-2011  . KNEE ARTHROSCOPY W/ MENISCECTOMY Left   . [place]  2001  . RIGHT/LEFT HEART CATH AND CORONARY ANGIOGRAPHY N/A 03/18/2020   Procedure: RIGHT/LEFT HEART CATH AND CORONARY ANGIOGRAPHY;  Surgeon: Jettie Booze, MD;  Location: [facility];  Service: Cardiovascular;  Laterality: N/A;  . SPINE SURGERY     3 since 11/2010, laminectomy, May 2012 lumbar fusion, diskectomy   Family History  Problem Relation Age of Onset  . Hypertension Mother   . High Cholesterol Mother   . Cancer Father        head and neck  . Hypertension Sister   . Hypertension Sister   . Diabetes type II Sister        was on steroids   . Hypertension Sister   . Hypertension Sister   . Cancer Sister        bladder  . Early death Neg Hx   . Diabetes Neg Hx   . Heart disease Neg Hx   . Kidney disease Neg Hx   . Stroke Neg Hx   . Breast cancer Neg Hx   . Parkinson's disease Neg Hx    Social History   Socioeconomic History  . Marital status: Married    Spouse name: Not on file  . Number of children: 2  . Years of education: 83  . Highest education level: Not  on file  Occupational History  . Occupation: Retired Therapist, sports  Tobacco Use  . Smoking status: Former Smoker    Quit date: 02/02/2013    Years since quitting: 8.1  . Smokeless tobacco: Never Used  Vaping Use  . Vaping Use: Never used  Substance and Sexual Activity  . Alcohol use: Yes    Alcohol/week: 3.0 - 5.0 standard drinks    Types: 3 - 5 Glasses of wine per week  . Drug use: No  . Sexual activity: Yes  Other Topics Concern  . Not on file  Social History Narrative   Lives in [place] with spouse at [facility]    Previously Volunteered as a Contractor   Caffeine use: none, quit at least 30 years ago   Social Determinants of Health  Financial Resource Strain: Low Risk   . Difficulty of Paying Living Expenses: Not hard at all  Food Insecurity: No Food Insecurity  . Worried About Charity fundraiser in the Last Year: Never true  . Ran Out of Food in the Last Year: Never true  Transportation Needs: No Transportation Needs  . Lack of Transportation (Medical): No  . Lack of Transportation (Non-Medical): No  Physical Activity: Sufficiently Active  . Days of Exercise per Week: 5 days  . Minutes of Exercise per Session: 150+ min  Stress: No Stress Concern Present  . Feeling of Stress : Only a little  Social Connections: Moderately Integrated  . Frequency of Communication with Friends and Family: More than three times a week  . Frequency of Social Gatherings with Friends and Family: Once a week  . Attends Religious Services: 1 to 4 times per year  . Active Member of Clubs or Organizations: No  . Attends Archivist Meetings: Never  . Marital Status: Married    Tobacco Counseling Counseling given: Not Answered   Clinical Intake:  Pre-visit preparation completed: Yes  Pain : 0-10 Pain Score: 3  Pain Type: Chronic pain Pain Location: Back Pain Descriptors / Indicators: Other (Comment),Tightness Pain Onset: More than a month ago Pain  Frequency: Intermittent     BMI - recorded: 30.62 Nutritional Status: BMI > 30  Obese Nutritional Risks: None Diabetes: No  How often do you need to have someone help you when you read instructions, pamphlets, or other written materials from your doctor or pharmacy?: 1 - Never  Diabetic?No  Interpreter Needed?: No  Information entered by :: Charlott Rakes, LPN   Activities of Daily Living In your present state of health, do you have any difficulty performing the following activities: 03/18/2021 10/22/2020  Hearing? N N  Vision? N N  Difficulty concentrating or making decisions? N N  Walking or climbing stairs? Y N  Comment very slowly -  Dressing or bathing? Y N  Comment husband assist -  Doing errands, shopping? N N  Preparing Food and eating ? N -  Using the Toilet? N -  In the past six months, have you accidently leaked urine? Y -  Comment just urgency at times -  Do you have problems with loss of bowel control? N -  Managing your Medications? N -  Managing your Finances? N -  Housekeeping or managing your Housekeeping? N -  Some recent data might be hidden    Patient Care Team: Leamon Arnt, MD as PCP - General (Family Medicine) Melvenia Beam, MD as Consulting Physician (Neurology) Thompson Grayer, MD as Consulting Physician (Cardiology) Jovita Gamma, MD as Consulting Physician (Neurosurgery) Richmond Campbell, MD as Consulting Physician (Gastroenterology) Jarome Matin, MD as Consulting Physician (Dermatology)  Indicate any recent Medical Services you may have received from other than [facility] providers in the past year (date may be approximate).     Assessment:   This is a routine wellness examination for U[place].  Hearing/Vision screen  Hearing Screening   125Hz  250Hz  500Hz  1000Hz  2000Hz  3000Hz  4000Hz  6000Hz  8000Hz   Right ear:           Left ear:           Comments: Pt denies any hearing issues   Vision Screening Comments: Pt follows up with Dr Sandre Kitty for annual eye exams   Dietary issues and exercise activities discussed: Current Exercise Habits: Structured exercise class, Type of exercise: stretching;walking;Other -  see comments (PT), Time (Minutes): > 60, Frequency (Times/Week): 5, Weekly Exercise (Minutes/Week): 0  Goals    . Patient Stated     To be able to get outside and walk       Depression Screen PHQ 2/9 Scores 03/18/2021 10/22/2020 07/01/2020 11/22/2019 07/31/2019 09/21/2017  PHQ - 2 Score 0 0 1 0 0 0  PHQ- 9 Score - - 3 - - -    Fall Risk Fall Risk  03/18/2021 10/22/2020 07/01/2020 07/01/2020 04/16/2020  Falls in the past year? 0 0 0 0 0  Number falls in past yr: 0 0 - - 0  Injury with Fall? 0 0 - - 0  Risk for fall due to : Impaired vision;Impaired balance/gait;Impaired mobility Impaired balance/gait - - -  Follow up Falls prevention discussed - - - -    FALL RISK PREVENTION PERTAINING TO THE HOME:  Any stairs in or around the home? No  If so, are there any without handrails? No  Home free of loose throw rugs in walkways, pet beds, electrical cords, etc? Yes  Adequate lighting in your home to reduce risk of falls? Yes   ASSISTIVE DEVICES UTILIZED TO PREVENT FALLS:  Life alert? Yes  Use of a cane, walker or w/c? Yes  Grab bars in the bathroom? Yes  Shower chair or bench in shower? Yes  Elevated toilet seat or a handicapped toilet? Yes   TIMED UP AND GO:  Was the test performed? No     Cognitive Function: 6 CIT declined at this time        Immunizations Immunization History  Administered Date(s) Administered  . Fluad Quad(high Dose 65+) 09/05/2016, 09/15/2020  . Influenza, High Dose Seasonal PF 09/28/2015, 09/21/2017, 09/22/2018  . Influenza,inj,quad, With Preservative 09/04/2017  . Moderna Sars-Covid-2 Vaccination 12/17/2019, 01/15/2020, 10/20/2020  . Pneumococcal Conjugate-13 02/21/2015  . Pneumococcal Polysaccharide-23 03/02/2012, 07/31/2019  . Tdap 03/14/2012  . Zoster 01/16/2012  .  Zoster Recombinat (Shingrix) 10/18/2019, 11/04/2019    TDAP status: Up to date  Flu Vaccine status: Up to date  Pneumococcal vaccine status: Up to date  Covid-19 vaccine status: Completed vaccines  Qualifies for Shingles Vaccine? Yes   Zostavax completed Yes   Shingrix Completed?: Yes  Screening Tests Health Maintenance  Topic Date Due  . INFLUENZA VACCINE  07/05/2021  . DEXA SCAN  11/21/2021  . MAMMOGRAM  02/06/2022  . TETANUS/TDAP  03/14/2022  . COVID-19 Vaccine  Completed  . Hepatitis C Screening  Completed  . PNA vac Low Risk Adult  Completed  . HPV VACCINES  Aged Out    Health Maintenance  There are no preventive care reminders to display for this patient.  Colorectal cancer screening: No longer required.   Mammogram status: Completed 02/06/21. Repeat every year  Bone Density status: Completed 11/22/19. Results reflect: Bone density results: OSTEOPENIA. Repeat every 2 years.   Additional Screening:  Hepatitis C Screening:  Completed 06/15/15  Vision Screening: Recommended annual ophthalmology exams for early detection of glaucoma and other disorders of the eye. Is the patient up to date with their annual eye exam?  Yes  Who is the provider or what is the name of the office in which the patient attends annual eye exams? Dr Sandre Kitty  If pt is not established with a provider, would they like to be referred to a provider to establish care? No .   Dental Screening: Recommended annual dental exams for proper oral hygiene  Community Resource Referral / Chronic Care  Management: CRR required this visit?  No   CCM required this visit?  No      Plan:     I have personally reviewed and noted the following in the patient's chart:   . Medical and social history . Use of alcohol, tobacco or illicit drugs  . Current medications and supplements . Functional ability and status . Nutritional status . Physical activity . Advanced directives . List of other  physicians . Hospitalizations, surgeries, and ER visits in previous 12 months . Vitals . Screenings to include cognitive, depression, and falls . Referrals and appointments  In addition, I have reviewed and discussed with patient certain preventive protocols, quality metrics, and best practice recommendations. A written personalized care plan for preventive services as well as general preventive health recommendations were provided to patient.     Willette Brace, LPN   07/14/1750   Nurse Notes:  Pt wants to have a B12 injection if you felt she would need for more energy related to fatigue, Please advise

## 2021-03-23 DIAGNOSIS — M5459 Other low back pain: Secondary | ICD-10-CM | POA: Diagnosis not present

## 2021-03-23 DIAGNOSIS — R278 Other lack of coordination: Secondary | ICD-10-CM | POA: Diagnosis not present

## 2021-03-23 DIAGNOSIS — R2689 Other abnormalities of gait and mobility: Secondary | ICD-10-CM | POA: Diagnosis not present

## 2021-03-23 DIAGNOSIS — M47816 Spondylosis without myelopathy or radiculopathy, lumbar region: Secondary | ICD-10-CM | POA: Diagnosis not present

## 2021-03-23 DIAGNOSIS — G20 Parkinson's disease: Secondary | ICD-10-CM | POA: Diagnosis not present

## 2021-03-23 DIAGNOSIS — M5136 Other intervertebral disc degeneration, lumbar region: Secondary | ICD-10-CM | POA: Diagnosis not present

## 2021-03-23 DIAGNOSIS — M48062 Spinal stenosis, lumbar region with neurogenic claudication: Secondary | ICD-10-CM | POA: Diagnosis not present

## 2021-03-25 DIAGNOSIS — R2689 Other abnormalities of gait and mobility: Secondary | ICD-10-CM | POA: Diagnosis not present

## 2021-03-25 DIAGNOSIS — M47816 Spondylosis without myelopathy or radiculopathy, lumbar region: Secondary | ICD-10-CM | POA: Diagnosis not present

## 2021-03-25 DIAGNOSIS — R278 Other lack of coordination: Secondary | ICD-10-CM | POA: Diagnosis not present

## 2021-03-25 DIAGNOSIS — M5459 Other low back pain: Secondary | ICD-10-CM | POA: Diagnosis not present

## 2021-03-25 DIAGNOSIS — M48062 Spinal stenosis, lumbar region with neurogenic claudication: Secondary | ICD-10-CM | POA: Diagnosis not present

## 2021-03-25 DIAGNOSIS — G20 Parkinson's disease: Secondary | ICD-10-CM | POA: Diagnosis not present

## 2021-03-25 DIAGNOSIS — M5136 Other intervertebral disc degeneration, lumbar region: Secondary | ICD-10-CM | POA: Diagnosis not present

## 2021-03-30 DIAGNOSIS — R278 Other lack of coordination: Secondary | ICD-10-CM | POA: Diagnosis not present

## 2021-03-30 DIAGNOSIS — M47816 Spondylosis without myelopathy or radiculopathy, lumbar region: Secondary | ICD-10-CM | POA: Diagnosis not present

## 2021-03-30 DIAGNOSIS — M5136 Other intervertebral disc degeneration, lumbar region: Secondary | ICD-10-CM | POA: Diagnosis not present

## 2021-03-30 DIAGNOSIS — R2689 Other abnormalities of gait and mobility: Secondary | ICD-10-CM | POA: Diagnosis not present

## 2021-03-30 DIAGNOSIS — M48062 Spinal stenosis, lumbar region with neurogenic claudication: Secondary | ICD-10-CM | POA: Diagnosis not present

## 2021-03-30 DIAGNOSIS — G20 Parkinson's disease: Secondary | ICD-10-CM | POA: Diagnosis not present

## 2021-03-30 DIAGNOSIS — M5459 Other low back pain: Secondary | ICD-10-CM | POA: Diagnosis not present

## 2021-03-31 ENCOUNTER — Telehealth (HOSPITAL_COMMUNITY): Payer: Self-pay | Admitting: *Deleted

## 2021-03-31 NOTE — Telephone Encounter (Signed)
Last office note faxed to Dr.Mustafa Siddiqui at [facility] fax # [phone] per pts request.

## 2021-04-01 DIAGNOSIS — Z85828 Personal history of other malignant neoplasm of skin: Secondary | ICD-10-CM | POA: Diagnosis not present

## 2021-04-01 DIAGNOSIS — L718 Other rosacea: Secondary | ICD-10-CM | POA: Diagnosis not present

## 2021-04-05 DIAGNOSIS — M48062 Spinal stenosis, lumbar region with neurogenic claudication: Secondary | ICD-10-CM | POA: Diagnosis not present

## 2021-04-05 DIAGNOSIS — M47816 Spondylosis without myelopathy or radiculopathy, lumbar region: Secondary | ICD-10-CM | POA: Diagnosis not present

## 2021-04-05 DIAGNOSIS — G20 Parkinson's disease: Secondary | ICD-10-CM | POA: Diagnosis not present

## 2021-04-05 DIAGNOSIS — M5459 Other low back pain: Secondary | ICD-10-CM | POA: Diagnosis not present

## 2021-04-05 DIAGNOSIS — M5136 Other intervertebral disc degeneration, lumbar region: Secondary | ICD-10-CM | POA: Diagnosis not present

## 2021-04-05 DIAGNOSIS — R2689 Other abnormalities of gait and mobility: Secondary | ICD-10-CM | POA: Diagnosis not present

## 2021-04-05 DIAGNOSIS — R278 Other lack of coordination: Secondary | ICD-10-CM | POA: Diagnosis not present

## 2021-04-09 ENCOUNTER — Other Ambulatory Visit (HOSPITAL_COMMUNITY): Payer: Self-pay | Admitting: *Deleted

## 2021-04-09 MED ORDER — LOSARTAN POTASSIUM 25 MG PO TABS: 12.5000 mg | ORAL_TABLET | Freq: Every day | ORAL | 3 refills | Status: AC

## 2021-04-15 ENCOUNTER — Other Ambulatory Visit (HOSPITAL_COMMUNITY): Payer: Self-pay | Admitting: *Deleted

## 2021-04-20 DIAGNOSIS — G629 Polyneuropathy, unspecified: Secondary | ICD-10-CM | POA: Diagnosis not present

## 2021-04-20 DIAGNOSIS — I951 Orthostatic hypotension: Secondary | ICD-10-CM | POA: Diagnosis not present

## 2021-04-20 DIAGNOSIS — M4800 Spinal stenosis, site unspecified: Secondary | ICD-10-CM | POA: Diagnosis not present

## 2021-04-20 DIAGNOSIS — G20 Parkinson's disease: Secondary | ICD-10-CM | POA: Diagnosis not present

## 2021-04-20 DIAGNOSIS — N183 Chronic kidney disease, stage 3 unspecified: Secondary | ICD-10-CM | POA: Diagnosis not present

## 2021-04-20 DIAGNOSIS — R269 Unspecified abnormalities of gait and mobility: Secondary | ICD-10-CM | POA: Diagnosis not present

## 2021-05-06 ENCOUNTER — Encounter: Payer: Self-pay | Admitting: Podiatry

## 2021-05-06 ENCOUNTER — Other Ambulatory Visit: Payer: Self-pay

## 2021-05-06 ENCOUNTER — Ambulatory Visit (INDEPENDENT_AMBULATORY_CARE_PROVIDER_SITE_OTHER): Payer: Medicare Other | Admitting: Podiatry

## 2021-05-06 DIAGNOSIS — M7752 Other enthesopathy of left foot: Secondary | ICD-10-CM

## 2021-05-06 DIAGNOSIS — M722 Plantar fascial fibromatosis: Secondary | ICD-10-CM | POA: Diagnosis not present

## 2021-05-06 MED ORDER — TRIAMCINOLONE ACETONIDE 40 MG/ML IJ SUSP
40.0000 mg | Freq: Once | INTRAMUSCULAR | Status: AC
  Administered 2021-05-06: 40 mg

## 2021-05-08 NOTE — Progress Notes (Signed)
She presents today for chief concern of left ankle pain states that has been swelling and aching.  She tried wearing the boot and her wooden shoe and applying Voltaren gel.  States that shoes that The ankle are very uncomfortable.  She states that her Parkinson's disease seems to be stable at this point.  They just put her on a different milligram of carbidopa-levodopa with an extended release tablet.  Objective: Vital signs are stable alert and oriented x3.  Pulses are palpable.  The majority of her pain is located over the anterior talofibular ligament and just distal to that.  Not quite in the sinus tarsi.  But she also has pain on palpation of the medial calcaneal tubercle left as well as tenderness in the posterior tibial tendon area.  Assessment: Ankle joint capsulitis lateral gutter.  Plan fasciitis left.  Cannot rule out a compensatory issue resulting in the ankle pain.  Plan: At this point I injected the plantar fascia 20 mg Kenalog 5 mg Marcaine also injected the ankle today 20 mg Kenalog 5 mg Marcaine both after sterile Betadine skin prep.  Tolerated procedure well without complications.  Follow-up with her in 6 weeks

## 2021-05-13 ENCOUNTER — Ambulatory Visit: Payer: Medicare Other | Admitting: Podiatry

## 2021-05-13 ENCOUNTER — Ambulatory Visit: Payer: Medicare Other | Attending: Internal Medicine

## 2021-05-13 ENCOUNTER — Other Ambulatory Visit: Payer: Self-pay

## 2021-05-13 ENCOUNTER — Other Ambulatory Visit (HOSPITAL_BASED_OUTPATIENT_CLINIC_OR_DEPARTMENT_OTHER): Payer: Self-pay

## 2021-05-13 DIAGNOSIS — Z23 Encounter for immunization: Secondary | ICD-10-CM

## 2021-05-13 MED ORDER — COVID-19 MRNA VACC (MODERNA) 100 MCG/0.5ML IM SUSP
INTRAMUSCULAR | 0 refills | Status: AC
  Filled 2021-05-13: qty 0.25, 1d supply, fill #0

## 2021-05-13 NOTE — Progress Notes (Signed)
   Covid-19 Vaccination Clinic  Name:  Carrie [name]    MRN: [id number] DOB: Sep 18, 1946  05/13/2021  Carrie [name] was observed post Covid-19 immunization for 15 minutes without incident. She was provided with Vaccine Information Sheet and instruction to access the V-Safe system.   Carrie [name] was instructed to call 911 with any severe reactions post vaccine: Difficulty breathing  Swelling of face and throat  A fast heartbeat  A bad rash all over body  Dizziness and weakness   Immunizations Administered     Name Date Dose VIS Date Route   Moderna Covid-19 Booster Vaccine 05/13/2021  2:27 PM 0.25 mL 09/23/2020 Intramuscular   Manufacturer: Moderna   Lot: [id number]   [place]: [id number]

## 2021-05-14 DIAGNOSIS — M5136 Other intervertebral disc degeneration, lumbar region: Secondary | ICD-10-CM | POA: Diagnosis not present

## 2021-05-14 DIAGNOSIS — M47816 Spondylosis without myelopathy or radiculopathy, lumbar region: Secondary | ICD-10-CM | POA: Diagnosis not present

## 2021-05-14 DIAGNOSIS — Z981 Arthrodesis status: Secondary | ICD-10-CM | POA: Diagnosis not present

## 2021-05-14 DIAGNOSIS — G20 Parkinson's disease: Secondary | ICD-10-CM | POA: Diagnosis not present

## 2021-05-14 DIAGNOSIS — M48062 Spinal stenosis, lumbar region with neurogenic claudication: Secondary | ICD-10-CM | POA: Diagnosis not present

## 2021-05-17 ENCOUNTER — Telehealth: Payer: Self-pay

## 2021-05-17 NOTE — Telephone Encounter (Signed)
  LAST APPOINTMENT DATE: 12/14/2020   NEXT APPOINTMENT DATE:@7 /27/2022  MEDICATION:HYDROcodone-acetaminophen (NORCO) 5-325 MG tablet  PHARMACY:[id number] [address] [place], [place] - [place]   Please advise

## 2021-05-18 MED ORDER — HYDROCODONE-ACETAMINOPHEN 5-325 MG PO TABS: 1.0000 | ORAL_TABLET | Freq: Two times a day (BID) | ORAL | 0 refills | Status: AC | PRN

## 2021-05-18 NOTE — Addendum Note (Signed)
Addended by: Billey Chang on: 05/18/2021 11:49 AM   Modules accepted: Orders

## 2021-05-20 ENCOUNTER — Other Ambulatory Visit: Payer: Self-pay

## 2021-05-20 ENCOUNTER — Encounter (HOSPITAL_COMMUNITY): Payer: Self-pay | Admitting: Internal Medicine

## 2021-05-20 ENCOUNTER — Ambulatory Visit (HOSPITAL_BASED_OUTPATIENT_CLINIC_OR_DEPARTMENT_OTHER)
Admission: RE | Admit: 2021-05-20 | Discharge: 2021-05-20 | Disposition: A | Discharge status: Home / Self Care | Payer: Medicare Other | Source: Ambulatory Visit | Attending: Internal Medicine | Admitting: Internal Medicine

## 2021-05-20 ENCOUNTER — Ambulatory Visit (HOSPITAL_COMMUNITY)
Admission: RE | Admit: 2021-05-20 | Discharge: 2021-05-20 | Disposition: A | Discharge status: Home / Self Care | Payer: Medicare Other | Source: Ambulatory Visit | Attending: Internal Medicine | Admitting: Internal Medicine

## 2021-05-20 VITALS — BP 140/80 | HR 83 | Wt 179.6 lb

## 2021-05-20 DIAGNOSIS — M549 Dorsalgia, unspecified: Secondary | ICD-10-CM | POA: Insufficient documentation

## 2021-05-20 DIAGNOSIS — G20 Parkinson's disease: Secondary | ICD-10-CM | POA: Insufficient documentation

## 2021-05-20 DIAGNOSIS — I429 Cardiomyopathy, unspecified: Secondary | ICD-10-CM | POA: Insufficient documentation

## 2021-05-20 DIAGNOSIS — Z79899 Other long term (current) drug therapy: Secondary | ICD-10-CM | POA: Diagnosis not present

## 2021-05-20 DIAGNOSIS — I5022 Chronic systolic (congestive) heart failure: Secondary | ICD-10-CM

## 2021-05-20 DIAGNOSIS — K589 Irritable bowel syndrome without diarrhea: Secondary | ICD-10-CM | POA: Diagnosis not present

## 2021-05-20 DIAGNOSIS — M79673 Pain in unspecified foot: Secondary | ICD-10-CM | POA: Diagnosis not present

## 2021-05-20 DIAGNOSIS — Z881 Allergy status to other antibiotic agents status: Secondary | ICD-10-CM | POA: Diagnosis not present

## 2021-05-20 DIAGNOSIS — I493 Ventricular premature depolarization: Secondary | ICD-10-CM

## 2021-05-20 DIAGNOSIS — Z888 Allergy status to other drugs, medicaments and biological substances status: Secondary | ICD-10-CM | POA: Insufficient documentation

## 2021-05-20 LAB — ECHOCARDIOGRAM COMPLETE
Area-P 1/2: 3.91 cm2
Calc EF: 46.9 %
S' Lateral: 3.9 cm
Single Plane A2C EF: 52.5 %
Single Plane A4C EF: 41 %

## 2021-05-20 NOTE — Patient Instructions (Addendum)
No Labs done today.   No medication changes were made. Please continue all current medications as prescribed.  Your physician recommends that you schedule a follow-up appointment in: 9 months. Please contact our office in February 2023 to schedule a March appointment.   If you have any questions or concerns before your next appointment please send [place] a message through [place] or call our office at [phone].    TO LEAVE A MESSAGE FOR THE NURSE SELECT OPTION 2, PLEASE LEAVE A MESSAGE INCLUDING: YOUR NAME DATE OF BIRTH CALL BACK NUMBER REASON FOR CALL**this is important as we prioritize the call backs  YOU WILL RECEIVE A CALL BACK THE SAME DAY AS LONG AS YOU CALL BEFORE 4:00 PM   Do the following things EVERYDAY: Weigh yourself in the morning before breakfast. Write it down and keep it in a log. Take your medicines as prescribed Eat low salt foods--Limit salt (sodium) to 2000 mg per day.  Stay as active as you can everyday Limit all fluids for the day to less than 2 liters   At the [facility], you and your health needs are our priority. As part of our continuing mission to provide you with exceptional heart care, we have created designated Provider Care Teams. These Care Teams include your primary Cardiologist (physician) and Advanced Practice Providers (APPs- Physician Assistants and Nurse Practitioners) who all work together to provide you with the care you need, when you need it.   You may see any of the following providers on your designated Care Team at your next follow up: Dr Glori Bickers Dr Haynes Kerns, NP Lyda Jester, [place] Audry Riles, PharmD   Please be sure to bring in all your medications bottles to every appointment.

## 2021-05-20 NOTE — Progress Notes (Signed)
ADVANCED HF CLINIC NOTE  Referring Physician: Nahser Primary Care: Dr. Billey Chang Primary Cardiologist: Nahser  HPI:  Carrie [name] is a 75 y/o woman with GERD, IBS and PVCs referred by Dr. 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   She has been followed by Dr. Acie Fredrickson and Dr. Rayann Heman for PVCs and dyspnea. Has been on Norpace. Developed increasing weakness and orthostasis and concern raised for TTR amyloid.Marland KitchencMRI ordered in 4/21 and showed EF 42% with LVNC. (no amyloid).  4./19 EF 50% Grade I DD 4/19 Lexiscan Myoview EF 48% question old apical infarct 2/20 EF 45-50% Grade I DD 3/21 EF 30-35% 3/21 PYP equivocal 4/21 cMRI EF 42% no LGE. + LVNC 6/22 EF 35-40%, G1DD  Cath 4/21  EF 45-50% No CAD RA = 4 PA = 38/11 (23) PCWP = 9 Fick 6.6/3.5   Last visit 11/04/20 was having trouble with multiple msk complaints, weakness, getting worked up for parkinson's.  Losartan decreased to 12.5mg  QHS due to symptomatic hypotension.     Here for FU today. Reports feeling well besides back pain and foot pain.  Breathing has been doing well, mainly limited by msk issues and weakness due to parkinsons (diagnosed in March).  Can walk for 20 mintues without dyspnea, cannot stand for more than 5 minutes due to pain and leg weakness.  Tolerating losartan 12.5mg  has had to skip about 3 times since March due to low bp.  No chest pain, orthopnea, PND.  Carbidopa levidopa being titrated, tremors have improved.     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 Use in each nostril as directed     calcium carbonate (TUMS EX) 750 MG chewable tablet Chew 750 mg by mouth every evening.      CARBIDOPA-LEVODOPA ER PO Take 1 tablet by mouth in the morning, at noon, and at bedtime. 25/100 mg     Cholecalciferol (VITAMIN D3) 2000 units TABS Take 2,000 Units by mouth every evening.      Ciclopirox 1 % shampoo Apply 1 application topically every other day.     diazepam (VALIUM) 2 MG tablet Take 2 mg by mouth as needed for anxiety.     docusate sodium (COLACE) 100 MG capsule Take 100 mg by mouth every evening.      famotidine (PEPCID) 40 MG tablet TAKE 1 TABLET (40 MG TOTAL) BY MOUTH DAILY AS NEEDED FOR HEARTBURN OR INDIGESTION. 90 tablet 3   fluticasone (FLONASE) 50 MCG/ACT nasal spray Place into both nostrils daily.     HYDROcodone-acetaminophen (NORCO) 5-325 MG tablet Take 1 tablet by mouth 2 (two) times daily as needed for moderate pain. [place]  tablet 0   losartan (COZAAR) 25 MG tablet Take 0.5 tablets (12.5 mg total) by mouth daily. 45 tablet 3   LUTEIN PO Take 10 mg by mouth daily.     mometasone (ELOCON) 0.1 % lotion Apply 1 application topically as needed for irritation.     Multiple Vitamin (MULTIVITAMIN) tablet Take 1 tablet by mouth daily.     Polyethylene Glycol 3350 (MIRALAX PO) Take by mouth. 3-4 times a week     sodium chloride (MURO 128) 2 % ophthalmic solution Place 1 drop into both eyes at bedtime.      tretinoin (RETIN-A) 0.025 % cream Apply 1 application topically daily as needed (skin bumps).   0   Wheat Dextrin (BENEFIBER PO) Take by mouth.     COVID-19 mRNA vaccine, Moderna, 100 MCG/0.5ML injection Inject into the muscle. 0.25 mL 0   No  current facility-administered medications for this encounter.    Allergies  Allergen Reactions   Phenergan [Promethazine Hcl] Shortness Of Breath and Other (See Comments)    Pt states she got to where she could not breath when she got iv phenergan after surgery   Erythromycin Diarrhea and Nausea And Vomiting   Other     Non steroids anti inflammatory agents related to kidney concerns    Azithromycin Other (See Comments)    Including macrobid  (weakness)   Epinephrine Other (See Comments)    Heart arrythmias   Nitrofurantoin Nausea Only and Other (See Comments)    Weakness   Vitals:   05/20/21 1127  BP: 140/80  Pulse: 83  SpO2: 100%  Weight: 81.5 kg (179 lb 9.6 oz)    PHYSICAL EXAM: General:  Well appearing but bradykinetic No resp difficulty HEENT: normal Neck: supple. no JVD. Carotids 2+ bilat; no bruits. No lymphadenopathy or thryomegaly appreciated. Cor: PMI nondisplaced. Regular rate & rhythm. No rubs, gallops or murmurs. Lungs: clear Abdomen: obese oft, nontender, nondistended. No hepatosplenomegaly. No bruits or masses. Good bowel sounds. Extremities: no cyanosis, clubbing, rash, edema Neuro: alert & orientedx3, cranial nerves grossly intact. moves all 4 extremities w/o difficulty. Affect pleasant   ASSESSMENT & PLAN:  1. Mild LV systolic dysfunction/HF due to LVNC - NYHA II-III but doubt exercise intolerance due primarily to HF, mostly limited by Parkinson's - Volume status looks good.  - cMRI 4/21 EF 42% No LGE. + LVNC - cath 4/21 normal cors and normal hemodynamics - EF has been stable over many years so suspect she may have an indolent phenotype - GDMT has been limited by low/labile BP - Tolerating losartan 12.5mg  qhs, will continue - Has seen Dr. Broadus John for genetic testing in 10/21 and felt to have isolated event due to lack of FHx. It was recommended that her daughters get routine screening with echo and ekg and she endorsed she would like to proceed with  genetic testing - Discussed need for more activity and weight loss as able - Follow with yearly echos  2. Frequent PVCs - followed by Dr. Rayann Heman, 01/2021 visit reports good control off norpace - No change today   3. Parkinson's Disease -diagnosed 02/2021 -follows with Dr. Linus Mako, carbidopa/levidopa therapy currently being titrated   Carrie Roan, Carrie [name]  11:33 AM  Patient seen and examined with the above-signed Advanced Practice Provider and/or Housestaff. I personally reviewed laboratory data, imaging studies and relevant notes. I independently examined the patient and formulated the important aspects of the plan. I have edited the note to reflect any of my changes or  salient points. I have personally discussed the plan with the patient and/or family.  Stable from a cardiac standpoint. NYHA III mostly due to Parkinson's disease. BP labile and likely has component of orthostatic hypotension.   Echo today EF 35-40%   General:  Elderly No resp difficulty HEENT: normal Neck: supple. no JVD. Carotids 2+ bilat; no bruits. No lymphadenopathy or thryomegaly appreciated. Cor: PMI nondisplaced. Regular rate & rhythm. No rubs, gallops or murmurs. Lungs: clear Abdomen: obese soft, nontender, nondistended. No hepatosplenomegaly. No bruits or masses. Good bowel sounds. Extremities: no cyanosis, clubbing, rash, edema + compression hose Neuro: alert & orientedx3, cranial nerves grossly intact. moves all 4 extremities w/o difficulty. Affect pleasant  Stable from cardiac standpoint. Echo today EF 35-40% BP too labile to titrate GDMT. Continue compression stockings.   Carrie Bickers, Carrie [name]  10:47 PM

## 2021-05-20 NOTE — Progress Notes (Signed)
  Echocardiogram 2D Echocardiogram has been performed.  Carrie [name] 05/20/2021, 12:06 PM

## 2021-06-03 ENCOUNTER — Other Ambulatory Visit (HOSPITAL_BASED_OUTPATIENT_CLINIC_OR_DEPARTMENT_OTHER): Payer: Self-pay | Admitting: Family Medicine

## 2021-06-11 ENCOUNTER — Telehealth: Payer: Self-pay

## 2021-06-11 NOTE — Telephone Encounter (Signed)
Error

## 2021-06-11 NOTE — Telephone Encounter (Signed)
Received call from [facility] office.  Was informed that patient passed away during her sleep sometime overnight.  Family stated that she was in her usual state of health upon going to sleep yesterday.  No concern for non natural causes of death.  Family does not wish to have autopsy.  Patient will be released to funeral home and they will send death certificate to our office.  Algis Greenhouse. Jerline Pain, MD 06/16/2021 8:13 AM

## 2021-06-16 NOTE — Telephone Encounter (Signed)
Done

## 2021-06-19 ENCOUNTER — Telehealth: Payer: Self-pay | Admitting: Family Medicine

## 2021-06-19 NOTE — Telephone Encounter (Signed)
9:56 Am Received call from funeral home requesting death certificate to be completed electronically as soon as possible.  Carrie [name] passed away while on vacation in [place] on 06/23/2021, caller does not know circumstances or time of death. Apparently death certificate was sent to local medical examiner, who refused to sign it. Document was realized yesterday and re-sent to PCP. Family would like her body to be cremated but according to caller, this cannot be done until death certificate is completed and signed. Jac Romulus [place], MD

## 2021-06-21 NOTE — Telephone Encounter (Signed)
Cerrtified death certificate on [facility] website.

## 2021-06-24 ENCOUNTER — Ambulatory Visit: Payer: Medicare Other | Admitting: Podiatry

## 2021-06-30 ENCOUNTER — Ambulatory Visit: Payer: Medicare Other | Admitting: Family Medicine

## 2021-07-05 DEATH — deceased

## 2022-03-07 IMAGING — DX DG FOOT COMPLETE 3+V*L*
3 series · 3 of 3 positions shown · non-contrast
Comparison: Plain films left foot 11/24/2015.

CLINICAL DATA: Left foot pain for 3 weeks since a twisting injury.

EXAM:
LEFT FOOT - COMPLETE 3+ VIEW

[foot ap]
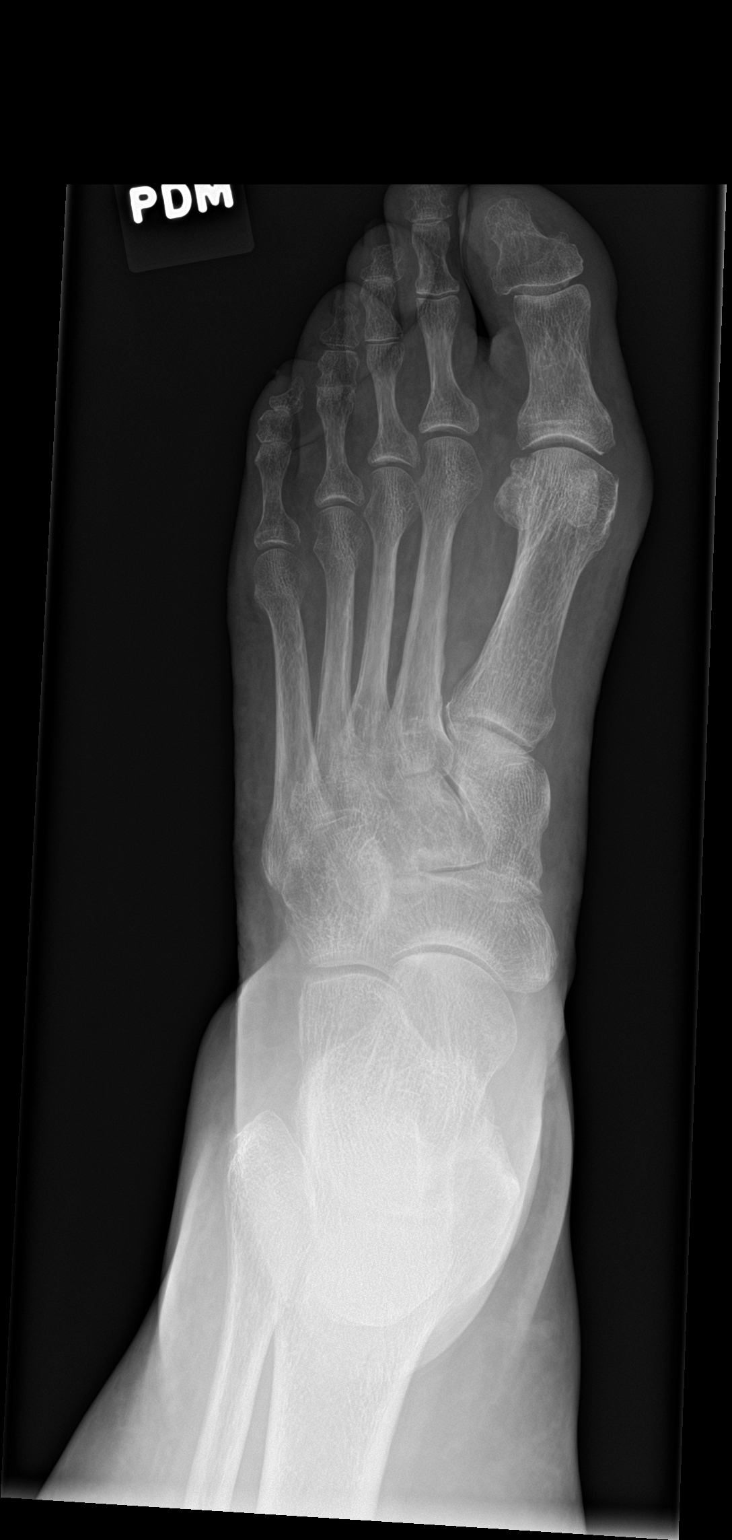

[foot obl]
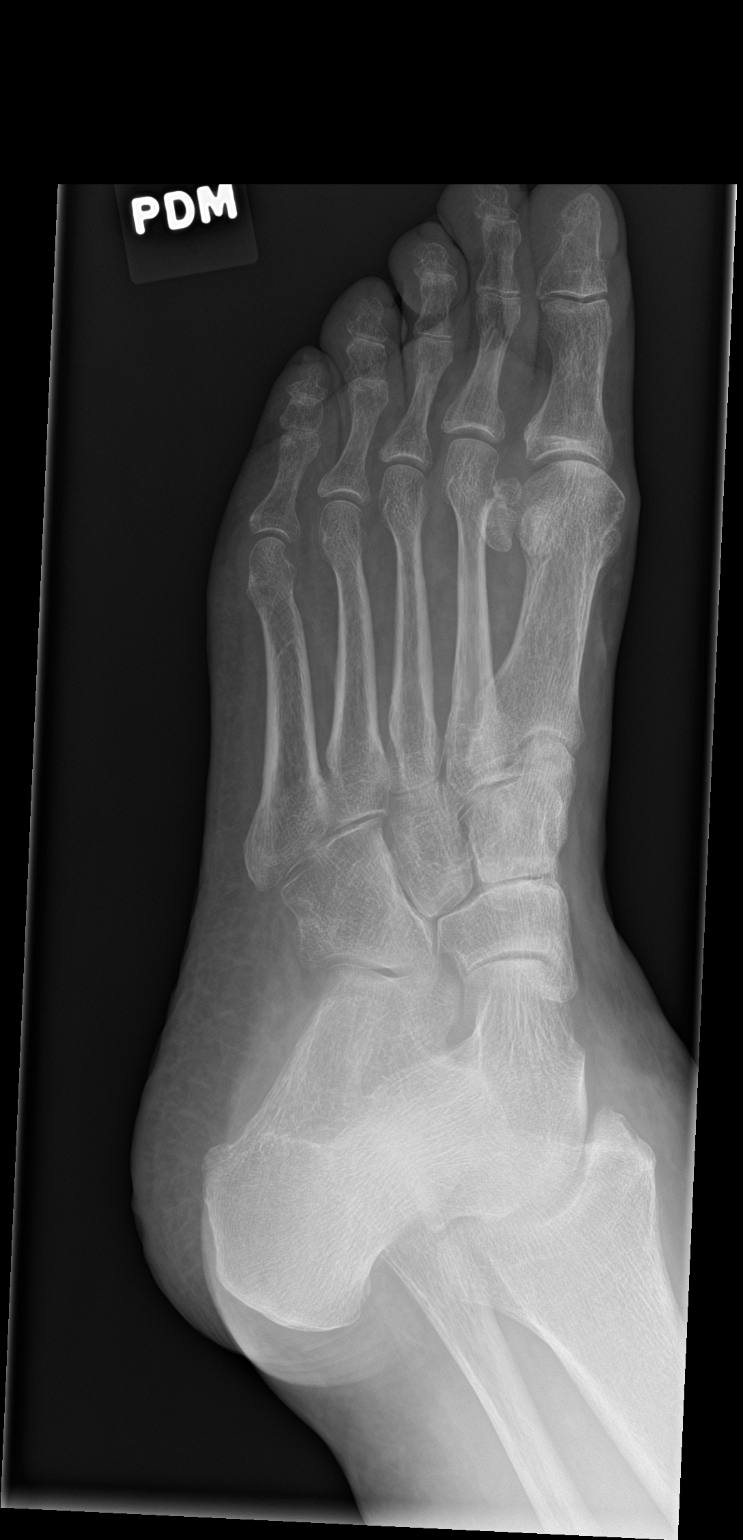

[foot lat]
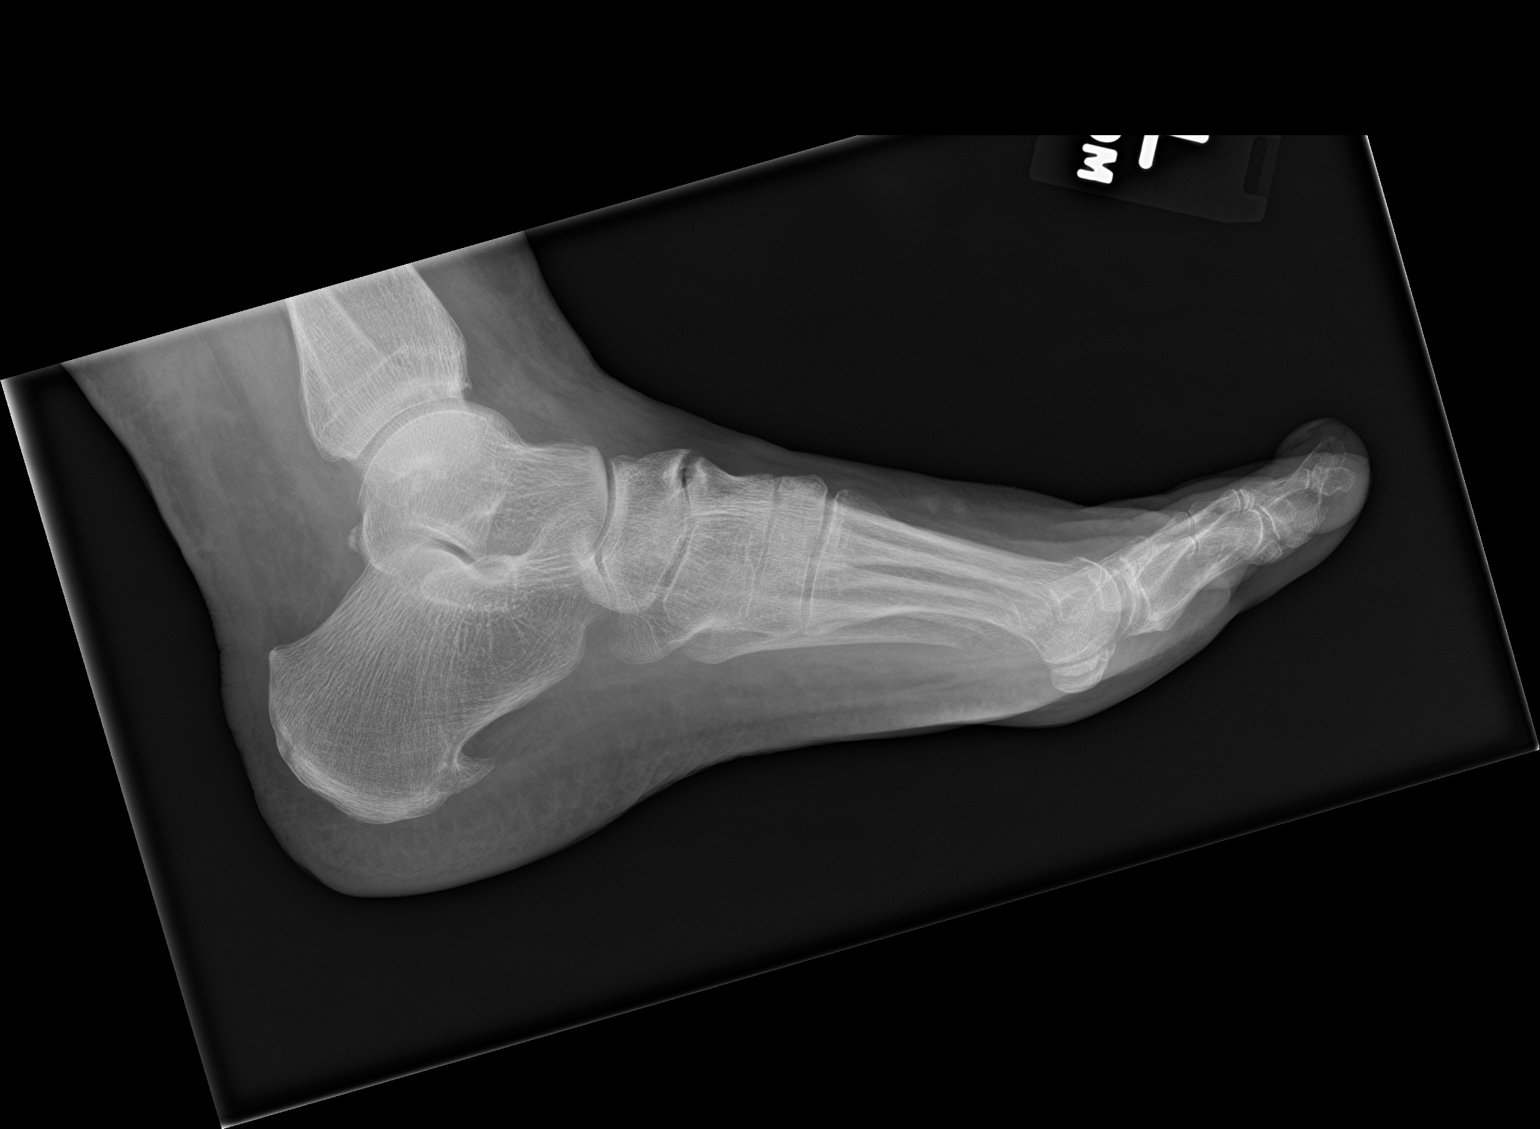

[3 of 3 positions shown; findings below may reference images not displayed]

FINDINGS: No acute bony or joint abnormality is identified. Joint space
narrowing osteophytosis at the articulation of the navicular and
medial cuneiform and a plantar calcaneal spur appear unchanged. Soft
tissues are unremarkable.
IMPRESSION: No acute finding.

No change in osteoarthritis at the articulation of the navicular and
medial cuneiform and plantar calcaneal spur.

## 2022-03-24 ENCOUNTER — Ambulatory Visit: Payer: Medicare Other

## 2022-08-16 IMAGING — MR MR ANKLE*L* W/O CM
4 of 5 series · 18 of 40 positions shown · non-contrast
Comparison: None.

CLINICAL DATA: Ankle pain.  No known injury.

EXAM:
MRI OF THE LEFT ANKLE WITHOUT CONTRAST
TECHNIQUE: Multiplanar, multisequence MR imaging of the ankle was performed. No
intravenous contrast was administered.

[Series 3: PD fat-sat · axial · left · 3.0mm · 0.31mm/px · z∈[-84,+48]mm · 9 of 34 slices shown]
[im 1/34]
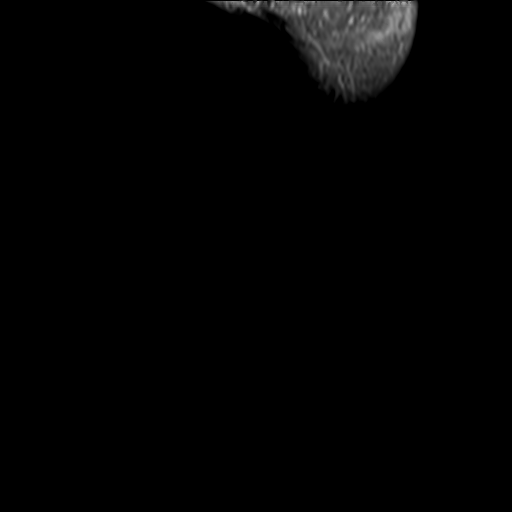
[im 5/34]
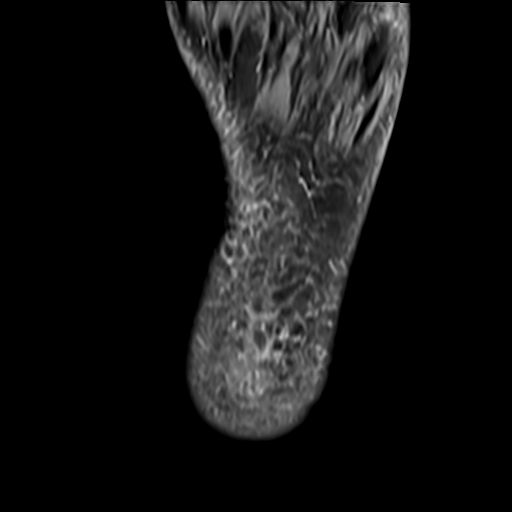
[im 9/34]
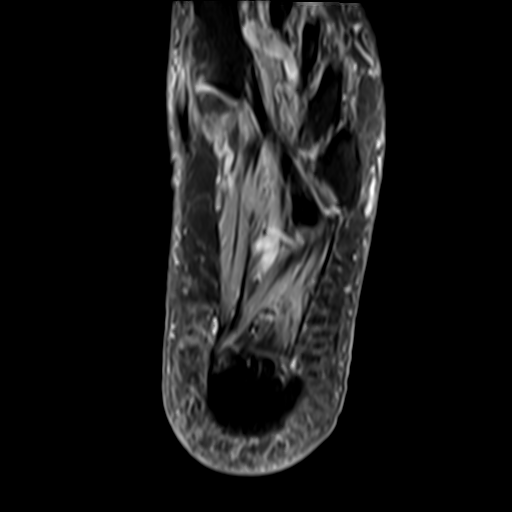
[im 13/34]
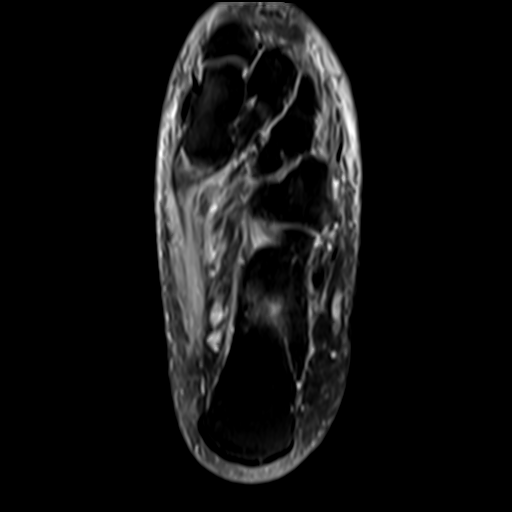
[im 17/34]
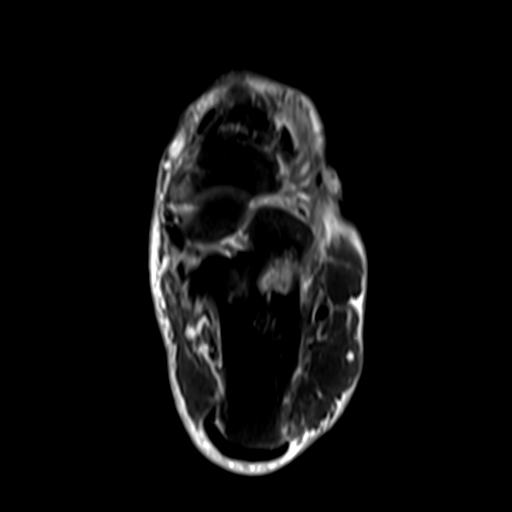
[im 21/34]
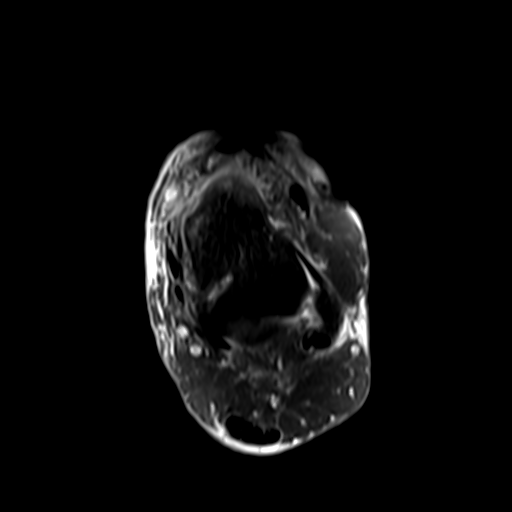
[im 25/34]
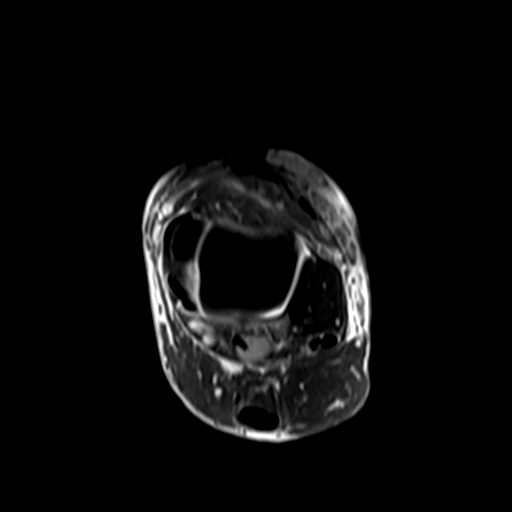
[im 29/34]
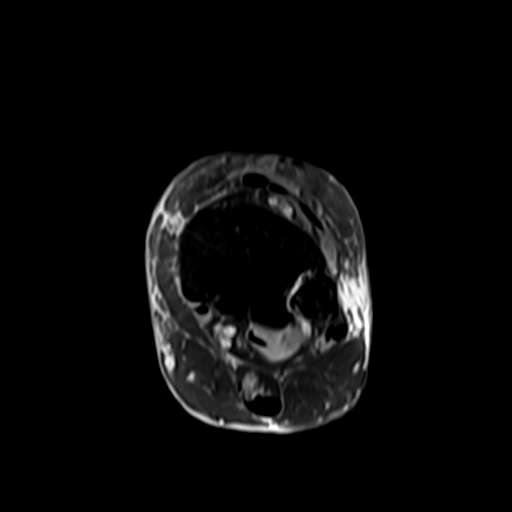
[im 34/34]
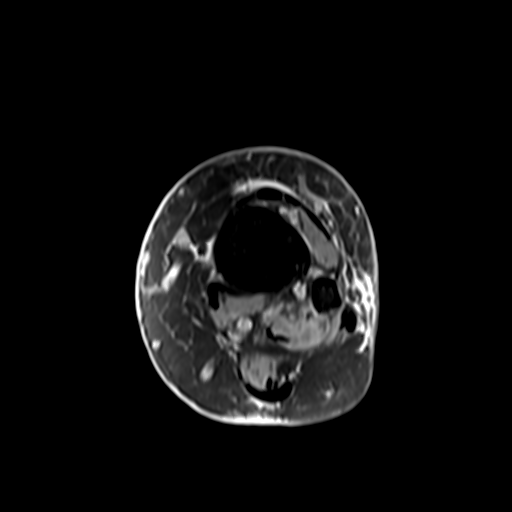

[Series 4: T2 fat-sat · axial · left · 3.0mm · 0.31mm/px · z∈[-68,+28]mm · 3 of 34 slices shown (1 of 2)]
[im 5/34]
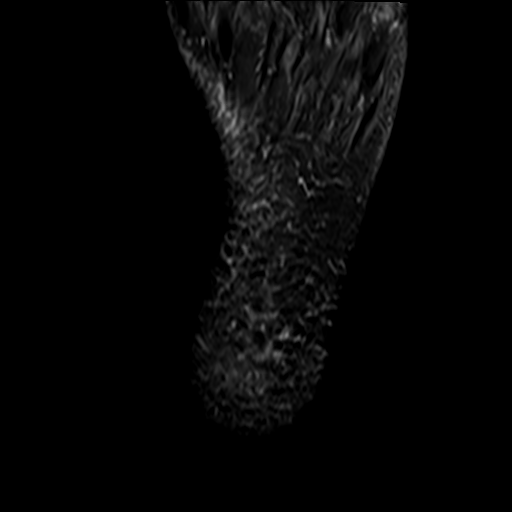
[im 17/34]
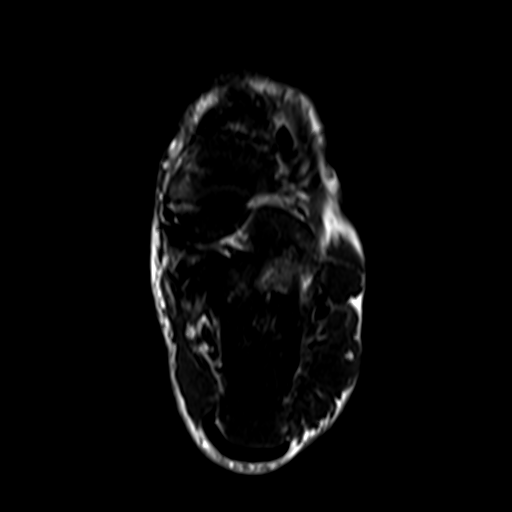
[im 29/34]
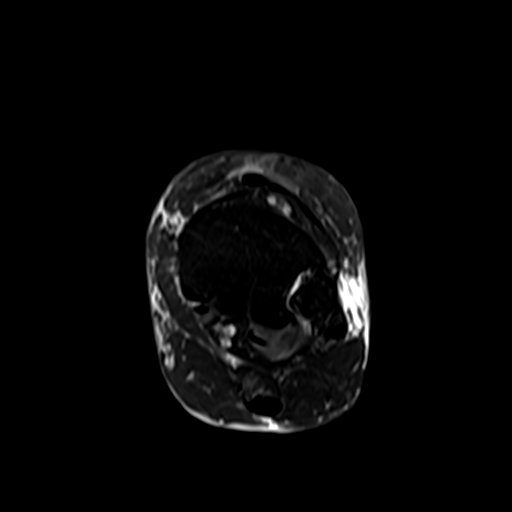

[Series 5: T1 · sagittal · left · 4.0mm · 0.33mm/px · 3 of 24 slices shown]
[im 5/24]
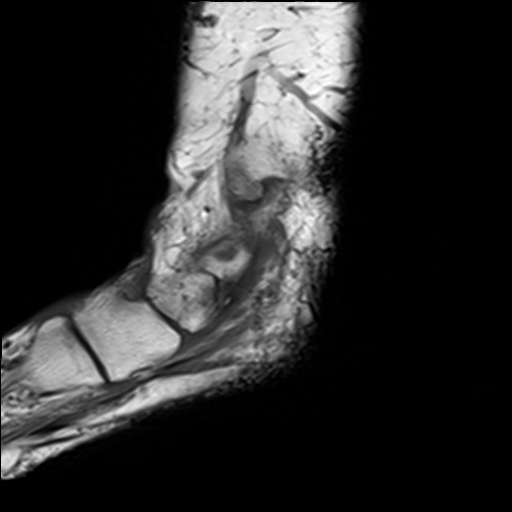
[im 14/24]
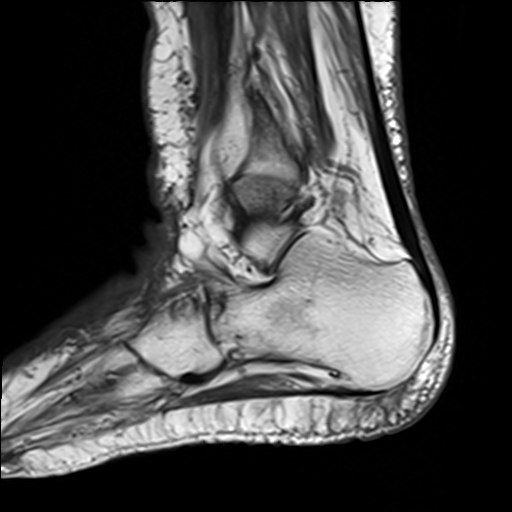
[im 24/24]
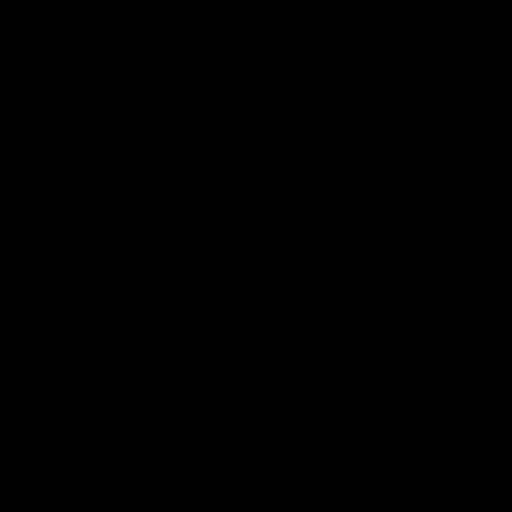

[Series 7: T2 fat-sat · coronal · left · 3.0mm · 0.31mm/px · 3 of 39 slices shown (2 of 2)]
[im 5/39]
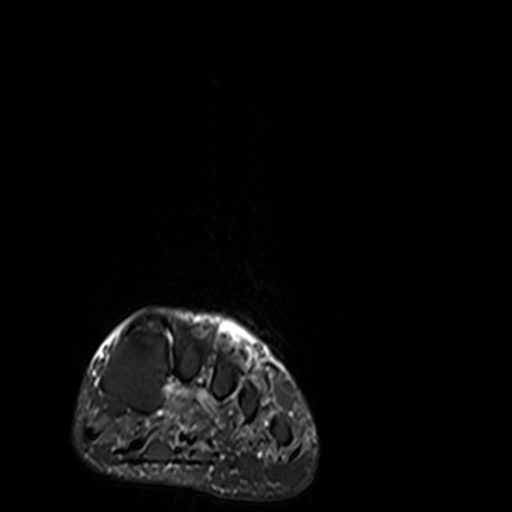
[im 22/39]
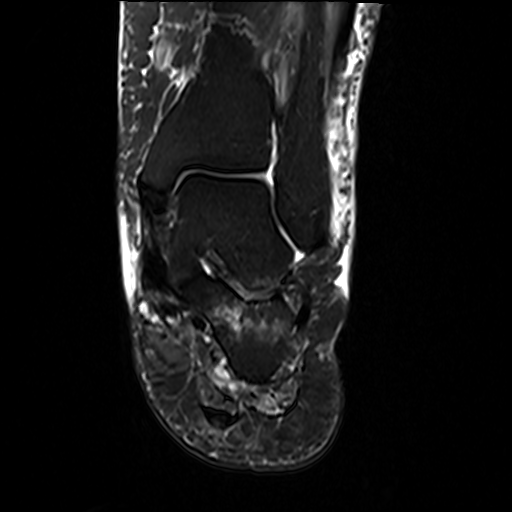
[im 34/39]
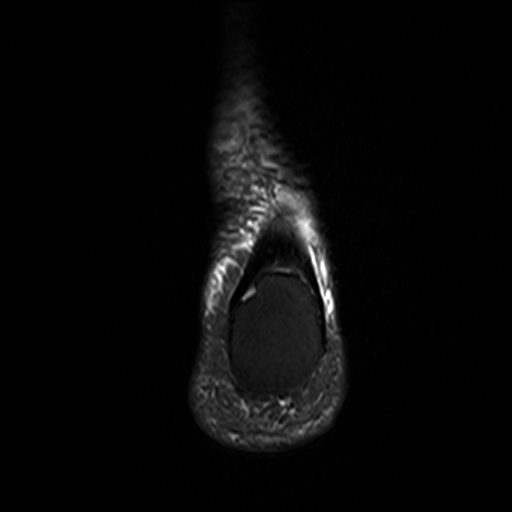

[18 of 40 positions shown; findings below may reference images not displayed]

FINDINGS: TENDONS

Peroneal: Peroneal longus tendon intact. Peroneal brevis intact.

Posteromedial: Posterior tibial tendon intact. Flexor hallucis
longus tendon intact. Flexor digitorum longus tendon intact.

Anterior: Tibialis anterior tendon intact. Extensor hallucis longus
tendon intact Extensor digitorum longus tendon intact.

Achilles:  Intact.

Plantar Fascia: Intact.

LIGAMENTS

Lateral: Anterior talofibular ligament intact. Calcaneofibular
ligament intact. Posterior talofibular ligament intact. Anterior and
posterior tibiofibular ligaments intact.

Medial: Deltoid ligament intact. Spring ligament intact.

CARTILAGE

Ankle Joint: No joint effusion. Normal ankle mortise. No chondral
defect.

Subtalar Joints/Sinus Tarsi: Normal subtalar joints. No subtalar
joint effusion. Normal sinus tarsi.

Bones: No fracture or dislocation. Prominent vessels in the mid
calcaneus. Small plantar calcaneal spur. Mild osteoarthritis of the
calcaneocuboid joint with mild subchondral reactive marrow edema.
Osteoarthritis of the navicular-medial cuneiform joint with
subchondral reactive marrow edema. Mild osteoarthritis of the
talonavicular joint. Severe bone marrow edema in the medial aspect
of the navicular concerning for a nondisplaced fracture versus a
type 2 os naviculare with marrow edema across the synchondrosis.

Soft Tissue: No fluid collection or hematoma. Muscles are normal
without edema or atrophy. Tarsal tunnel is normal.
IMPRESSION: 1. Severe bone marrow edema in the medial aspect of the navicular
concerning for a nondisplaced fracture versus a type 2 os naviculare
with marrow edema across the synchondrosis.
2. Osteoarthritis of the navicular-medial cuneiform joint with
subchondral reactive marrow edema.
3. Mild osteoarthritis of the talonavicular joint.
4. Mild osteoarthritis of the calcaneocuboid joint with mild
subchondral reactive marrow edema.
